# Patient Record
Sex: Male | Born: 1935 | Race: White | Hispanic: No | Smoking: Former smoker
Health system: Southern US, Community
[De-identification: ages and names within clinical notes are randomized; demographics above are authoritative.]

## PROBLEM LIST (undated history)

## (undated) DIAGNOSIS — I10 Essential (primary) hypertension: Secondary | ICD-10-CM

## (undated) DIAGNOSIS — F32A Depression, unspecified: Secondary | ICD-10-CM

## (undated) DIAGNOSIS — E039 Hypothyroidism, unspecified: Secondary | ICD-10-CM

## (undated) DIAGNOSIS — J449 Chronic obstructive pulmonary disease, unspecified: Secondary | ICD-10-CM

## (undated) DIAGNOSIS — I4891 Unspecified atrial fibrillation: Secondary | ICD-10-CM

## (undated) DIAGNOSIS — J45909 Unspecified asthma, uncomplicated: Secondary | ICD-10-CM

## (undated) DIAGNOSIS — K219 Gastro-esophageal reflux disease without esophagitis: Secondary | ICD-10-CM

## (undated) DIAGNOSIS — N400 Enlarged prostate without lower urinary tract symptoms: Secondary | ICD-10-CM

## (undated) DIAGNOSIS — I5022 Chronic systolic (congestive) heart failure: Secondary | ICD-10-CM

## (undated) DIAGNOSIS — F419 Anxiety disorder, unspecified: Secondary | ICD-10-CM

## (undated) DIAGNOSIS — I719 Aortic aneurysm of unspecified site, without rupture: Secondary | ICD-10-CM

## (undated) DIAGNOSIS — F329 Major depressive disorder, single episode, unspecified: Secondary | ICD-10-CM

## (undated) DIAGNOSIS — F0390 Unspecified dementia without behavioral disturbance: Secondary | ICD-10-CM

## (undated) HISTORY — PX: CORONARY ARTERY BYPASS GRAFT: SHX141

## (undated) HISTORY — PX: PROSTATE SURGERY: SHX751

## (undated) HISTORY — PX: ABDOMINAL AORTIC ANEURYSM REPAIR: SUR1152

---

## 2004-09-11 ENCOUNTER — Ambulatory Visit: Payer: Self-pay | Admitting: Internal Medicine

## 2004-10-20 ENCOUNTER — Ambulatory Visit: Payer: Self-pay | Admitting: Oncology

## 2004-10-30 ENCOUNTER — Ambulatory Visit: Payer: Self-pay | Admitting: Gastroenterology

## 2004-11-27 ENCOUNTER — Ambulatory Visit: Payer: Self-pay | Admitting: Gastroenterology

## 2005-01-01 ENCOUNTER — Ambulatory Visit: Payer: Self-pay | Admitting: Gastroenterology

## 2005-02-06 ENCOUNTER — Ambulatory Visit: Payer: Self-pay | Admitting: Oncology

## 2005-02-25 ENCOUNTER — Ambulatory Visit: Payer: Self-pay | Admitting: Internal Medicine

## 2006-12-03 ENCOUNTER — Emergency Department: Payer: Self-pay | Admitting: Emergency Medicine

## 2006-12-10 ENCOUNTER — Emergency Department: Payer: Self-pay | Admitting: Specialist

## 2006-12-11 ENCOUNTER — Emergency Department: Payer: Self-pay | Admitting: Emergency Medicine

## 2006-12-31 ENCOUNTER — Ambulatory Visit: Payer: Self-pay | Admitting: Urology

## 2006-12-31 ENCOUNTER — Other Ambulatory Visit: Payer: Self-pay

## 2007-01-04 ENCOUNTER — Ambulatory Visit: Payer: Self-pay | Admitting: Urology

## 2007-01-08 ENCOUNTER — Emergency Department: Payer: Self-pay | Admitting: Unknown Physician Specialty

## 2007-01-08 ENCOUNTER — Other Ambulatory Visit: Payer: Self-pay

## 2007-01-31 ENCOUNTER — Ambulatory Visit: Payer: Self-pay | Admitting: Cardiology

## 2007-04-04 ENCOUNTER — Ambulatory Visit: Payer: Self-pay | Admitting: Internal Medicine

## 2007-11-23 ENCOUNTER — Other Ambulatory Visit: Payer: Self-pay

## 2007-11-24 ENCOUNTER — Inpatient Hospital Stay: Payer: Self-pay | Admitting: Internal Medicine

## 2008-10-14 ENCOUNTER — Emergency Department: Payer: Self-pay | Admitting: Unknown Physician Specialty

## 2008-10-25 ENCOUNTER — Inpatient Hospital Stay: Payer: Self-pay | Admitting: Specialist

## 2010-12-11 ENCOUNTER — Inpatient Hospital Stay: Payer: Self-pay | Admitting: Specialist

## 2011-01-03 ENCOUNTER — Inpatient Hospital Stay: Payer: Self-pay | Admitting: Specialist

## 2011-01-18 ENCOUNTER — Inpatient Hospital Stay: Payer: Self-pay | Admitting: Internal Medicine

## 2011-01-22 ENCOUNTER — Ambulatory Visit: Payer: Self-pay | Admitting: Internal Medicine

## 2011-02-01 ENCOUNTER — Emergency Department: Payer: Self-pay | Admitting: Emergency Medicine

## 2011-02-02 ENCOUNTER — Inpatient Hospital Stay: Payer: Self-pay | Admitting: Internal Medicine

## 2011-02-09 ENCOUNTER — Emergency Department: Payer: Self-pay | Admitting: Emergency Medicine

## 2011-02-12 ENCOUNTER — Inpatient Hospital Stay: Payer: Self-pay | Admitting: Internal Medicine

## 2011-02-21 ENCOUNTER — Emergency Department: Payer: Self-pay | Admitting: Emergency Medicine

## 2011-02-24 ENCOUNTER — Ambulatory Visit: Payer: Self-pay | Admitting: Internal Medicine

## 2012-03-07 ENCOUNTER — Ambulatory Visit: Payer: Self-pay | Admitting: Internal Medicine

## 2012-06-14 ENCOUNTER — Emergency Department: Payer: Self-pay | Admitting: Emergency Medicine

## 2012-06-15 LAB — PATHOLOGY REPORT

## 2012-09-14 ENCOUNTER — Inpatient Hospital Stay: Payer: Self-pay | Admitting: Internal Medicine

## 2012-09-14 LAB — CBC
HCT: 40.9 % (ref 40.0–52.0)
HGB: 13.6 g/dL (ref 13.0–18.0)
MCH: 28.1 pg (ref 26.0–34.0)
MCV: 85 fL (ref 80–100)

## 2012-09-14 LAB — COMPREHENSIVE METABOLIC PANEL
Albumin: 3.1 g/dL — ABNORMAL LOW (ref 3.4–5.0)
Alkaline Phosphatase: 114 U/L (ref 50–136)
BUN: 15 mg/dL (ref 7–18)
Calcium, Total: 8.5 mg/dL (ref 8.5–10.1)
Chloride: 106 mmol/L (ref 98–107)
Co2: 31 mmol/L (ref 21–32)
Creatinine: 1.05 mg/dL (ref 0.60–1.30)
EGFR (Non-African Amer.): >60
Glucose: 96 mg/dL (ref 65–99)
SGPT (ALT): 23 U/L (ref 12–78)

## 2012-09-14 LAB — TROPONIN I: Troponin-I: <0.02 ng/mL

## 2012-09-14 LAB — PROTIME-INR: Prothrombin Time: 14.5 secs (ref 11.5–14.7)

## 2012-09-14 LAB — CK TOTAL AND CKMB (NOT AT ARMC): CK-MB: 1.4 ng/mL (ref 0.5–3.6)

## 2012-09-15 LAB — CBC WITH DIFFERENTIAL/PLATELET
Basophil #: 0 10*3/uL (ref 0.0–0.1)
Eosinophil %: 0.2 %
HCT: 46.6 % (ref 40.0–52.0)
HGB: 15.5 g/dL (ref 13.0–18.0)
Lymphocyte #: 0.7 10*3/uL — ABNORMAL LOW (ref 1.0–3.6)
MCHC: 33.4 g/dL (ref 32.0–36.0)
Monocyte #: 0.3 x10 3/mm (ref 0.2–1.0)
Neutrophil #: 6.4 10*3/uL (ref 1.4–6.5)
RBC: 5.47 10*6/uL (ref 4.40–5.90)

## 2012-09-15 LAB — BASIC METABOLIC PANEL
Anion Gap: 6 — ABNORMAL LOW (ref 7–16)
Chloride: 101 mmol/L (ref 98–107)
EGFR (African American): >60
Glucose: 114 mg/dL — ABNORMAL HIGH (ref 65–99)
Osmolality: 281 (ref 275–301)
Potassium: 4.1 mmol/L (ref 3.5–5.1)

## 2012-09-16 DIAGNOSIS — I509 Heart failure, unspecified: Secondary | ICD-10-CM

## 2012-09-19 LAB — CREATININE, SERUM
Creatinine: 1.32 mg/dL — ABNORMAL HIGH (ref 0.60–1.30)
EGFR (African American): >60

## 2012-10-09 ENCOUNTER — Inpatient Hospital Stay: Payer: Self-pay | Admitting: Internal Medicine

## 2012-10-09 LAB — URINALYSIS, COMPLETE
Bacteria: NONE SEEN
Glucose,UR: >=500 mg/dL (ref 0–75)
Leukocyte Esterase: NEGATIVE
Ph: 6 (ref 4.5–8.0)
Protein: 30
Squamous Epithelial: NONE SEEN
WBC UR: <1 /HPF (ref 0–5)

## 2012-10-09 LAB — CBC
HCT: 44.4 % (ref 40.0–52.0)
HGB: 14.8 g/dL (ref 13.0–18.0)
MCH: 28.3 pg (ref 26.0–34.0)
MCV: 85 fL (ref 80–100)
Platelet: 142 10*3/uL — ABNORMAL LOW (ref 150–440)
RBC: 5.21 10*6/uL (ref 4.40–5.90)
RDW: 17.1 % — ABNORMAL HIGH (ref 11.5–14.5)

## 2012-10-09 LAB — COMPREHENSIVE METABOLIC PANEL
Albumin: 3 g/dL — ABNORMAL LOW (ref 3.4–5.0)
Alkaline Phosphatase: 119 U/L (ref 50–136)
Bilirubin,Total: 0.7 mg/dL (ref 0.2–1.0)
Calcium, Total: 8.9 mg/dL (ref 8.5–10.1)
Chloride: 106 mmol/L (ref 98–107)
Co2: 33 mmol/L — ABNORMAL HIGH (ref 21–32)
Glucose: 108 mg/dL — ABNORMAL HIGH (ref 65–99)
Potassium: 4.3 mmol/L (ref 3.5–5.1)
SGOT(AST): 19 U/L (ref 15–37)
Total Protein: 6.6 g/dL (ref 6.4–8.2)

## 2012-10-09 LAB — CK-MB
CK-MB: <0.5 ng/mL — ABNORMAL LOW (ref 0.5–3.6)
CK-MB: 0.8 ng/mL (ref 0.5–3.6)

## 2012-10-09 LAB — PRO B NATRIURETIC PEPTIDE: B-Type Natriuretic Peptide: 2990 pg/mL — ABNORMAL HIGH (ref 0–125)

## 2012-10-09 LAB — CK TOTAL AND CKMB (NOT AT ARMC): CK-MB: 1 ng/mL (ref 0.5–3.6)

## 2012-10-09 LAB — TROPONIN I: Troponin-I: <0.02 ng/mL

## 2012-10-10 LAB — URINE CULTURE

## 2012-10-10 LAB — CBC WITH DIFFERENTIAL/PLATELET
Basophil #: 0 10*3/uL (ref 0.0–0.1)
Basophil %: 0.3 %
Eosinophil #: 0 10*3/uL (ref 0.0–0.7)
HCT: 41.9 % (ref 40.0–52.0)
Lymphocyte #: 0.6 10*3/uL — ABNORMAL LOW (ref 1.0–3.6)
Lymphocyte %: 6 %
MCH: 28.3 pg (ref 26.0–34.0)
MCHC: 33.8 g/dL (ref 32.0–36.0)
MCV: 84 fL (ref 80–100)
Monocyte #: 0.2 x10 3/mm (ref 0.2–1.0)
Monocyte %: 2 %
Neutrophil #: 9.8 10*3/uL — ABNORMAL HIGH (ref 1.4–6.5)
Neutrophil %: 91.7 %
RBC: 5 10*6/uL (ref 4.40–5.90)
RDW: 17 % — ABNORMAL HIGH (ref 11.5–14.5)
WBC: 10.7 10*3/uL — ABNORMAL HIGH (ref 3.8–10.6)

## 2012-10-10 LAB — BASIC METABOLIC PANEL
Anion Gap: 5 — ABNORMAL LOW (ref 7–16)
BUN: 20 mg/dL — ABNORMAL HIGH (ref 7–18)
Calcium, Total: 9 mg/dL (ref 8.5–10.1)
Chloride: 104 mmol/L (ref 98–107)
Co2: 30 mmol/L (ref 21–32)
EGFR (African American): >60
EGFR (Non-African Amer.): >60
Osmolality: 283 (ref 275–301)
Sodium: 139 mmol/L (ref 136–145)

## 2012-12-11 ENCOUNTER — Emergency Department: Payer: Self-pay | Admitting: Emergency Medicine

## 2012-12-11 LAB — COMPREHENSIVE METABOLIC PANEL
Albumin: 3.6 g/dL (ref 3.4–5.0)
Alkaline Phosphatase: 118 U/L (ref 50–136)
Anion Gap: 9 (ref 7–16)
BUN: 12 mg/dL (ref 7–18)
Bilirubin,Total: 0.5 mg/dL (ref 0.2–1.0)
Calcium, Total: 9.3 mg/dL (ref 8.5–10.1)
Chloride: 103 mmol/L (ref 98–107)
Creatinine: 1.09 mg/dL (ref 0.60–1.30)
EGFR (African American): >60
EGFR (Non-African Amer.): >60
Glucose: 118 mg/dL — ABNORMAL HIGH (ref 65–99)
Osmolality: 278 (ref 275–301)
Potassium: 3.5 mmol/L (ref 3.5–5.1)
Total Protein: 7.4 g/dL (ref 6.4–8.2)

## 2012-12-11 LAB — URINALYSIS, COMPLETE
Bacteria: NONE SEEN
Bilirubin,UR: NEGATIVE
Blood: NEGATIVE
Hyaline Cast: 2
Ph: 7 (ref 4.5–8.0)
RBC,UR: 1 /HPF (ref 0–5)
Squamous Epithelial: NONE SEEN
WBC UR: <1 /HPF (ref 0–5)

## 2012-12-11 LAB — TROPONIN I: Troponin-I: <0.02 ng/mL

## 2012-12-11 LAB — CBC
HGB: 15.4 g/dL (ref 13.0–18.0)
MCHC: 35.2 g/dL (ref 32.0–36.0)
MCV: 84 fL (ref 80–100)
RDW: 17.4 % — ABNORMAL HIGH (ref 11.5–14.5)
WBC: 10.5 10*3/uL (ref 3.8–10.6)

## 2013-02-27 ENCOUNTER — Emergency Department: Payer: Self-pay | Admitting: Emergency Medicine

## 2013-02-27 LAB — COMPREHENSIVE METABOLIC PANEL
ALBUMIN: 3.6 g/dL (ref 3.4–5.0)
ALT: 42 U/L (ref 12–78)
Alkaline Phosphatase: 98 U/L
Anion Gap: 5 — ABNORMAL LOW (ref 7–16)
BILIRUBIN TOTAL: 0.7 mg/dL (ref 0.2–1.0)
BUN: 14 mg/dL (ref 7–18)
CO2: 31 mmol/L (ref 21–32)
Calcium, Total: 9.4 mg/dL (ref 8.5–10.1)
Chloride: 104 mmol/L (ref 98–107)
Creatinine: 0.86 mg/dL (ref 0.60–1.30)
GLUCOSE: 79 mg/dL (ref 65–99)
Osmolality: 279 (ref 275–301)
Potassium: 4 mmol/L (ref 3.5–5.1)
SGOT(AST): 42 U/L — ABNORMAL HIGH (ref 15–37)
SODIUM: 140 mmol/L (ref 136–145)
TOTAL PROTEIN: 7.5 g/dL (ref 6.4–8.2)

## 2013-02-27 LAB — CBC
HCT: 44.8 % (ref 40.0–52.0)
HGB: 15.2 g/dL (ref 13.0–18.0)
MCH: 29.4 pg (ref 26.0–34.0)
MCHC: 33.9 g/dL (ref 32.0–36.0)
MCV: 87 fL (ref 80–100)
Platelet: 175 10*3/uL (ref 150–440)
RBC: 5.17 10*6/uL (ref 4.40–5.90)
RDW: 15.5 % — ABNORMAL HIGH (ref 11.5–14.5)
WBC: 9.5 10*3/uL (ref 3.8–10.6)

## 2013-06-25 ENCOUNTER — Emergency Department: Payer: Self-pay | Admitting: Internal Medicine

## 2013-06-25 LAB — URINALYSIS, COMPLETE
Bacteria: NONE SEEN
Bilirubin,UR: NEGATIVE
Blood: NEGATIVE
Glucose,UR: NEGATIVE mg/dL (ref 0–75)
KETONE: NEGATIVE
LEUKOCYTE ESTERASE: NEGATIVE
NITRITE: NEGATIVE
PROTEIN: NEGATIVE
Ph: 7 (ref 4.5–8.0)
SPECIFIC GRAVITY: 1.011 (ref 1.003–1.030)

## 2013-06-25 LAB — CBC
HCT: 46.4 % (ref 40.0–52.0)
HGB: 15.7 g/dL (ref 13.0–18.0)
MCH: 29.5 pg (ref 26.0–34.0)
MCHC: 33.8 g/dL (ref 32.0–36.0)
MCV: 87 fL (ref 80–100)
Platelet: 187 10*3/uL (ref 150–440)
RBC: 5.33 10*6/uL (ref 4.40–5.90)
RDW: 16 % — AB (ref 11.5–14.5)
WBC: 12.1 10*3/uL — ABNORMAL HIGH (ref 3.8–10.6)

## 2013-06-25 LAB — COMPREHENSIVE METABOLIC PANEL
ALK PHOS: 102 U/L
Albumin: 3.5 g/dL (ref 3.4–5.0)
Anion Gap: 7 (ref 7–16)
BUN: 14 mg/dL (ref 7–18)
Bilirubin,Total: 0.9 mg/dL (ref 0.2–1.0)
CHLORIDE: 102 mmol/L (ref 98–107)
CREATININE: 1.01 mg/dL (ref 0.60–1.30)
Calcium, Total: 9.6 mg/dL (ref 8.5–10.1)
Co2: 31 mmol/L (ref 21–32)
GLUCOSE: 103 mg/dL — AB (ref 65–99)
Osmolality: 280 (ref 275–301)
Potassium: 4 mmol/L (ref 3.5–5.1)
SGOT(AST): 45 U/L — ABNORMAL HIGH (ref 15–37)
SGPT (ALT): 41 U/L (ref 12–78)
Sodium: 140 mmol/L (ref 136–145)
Total Protein: 7.7 g/dL (ref 6.4–8.2)

## 2013-06-25 LAB — PRO B NATRIURETIC PEPTIDE: B-TYPE NATIURETIC PEPTID: 2196 pg/mL — AB (ref 0–450)

## 2013-06-25 LAB — TROPONIN I: Troponin-I: <0.02 ng/mL

## 2013-07-27 ENCOUNTER — Inpatient Hospital Stay: Payer: Self-pay | Admitting: Internal Medicine

## 2013-07-27 LAB — BASIC METABOLIC PANEL
Anion Gap: 6 — ABNORMAL LOW (ref 7–16)
BUN: 15 mg/dL (ref 7–18)
CHLORIDE: 104 mmol/L (ref 98–107)
Calcium, Total: 8.9 mg/dL (ref 8.5–10.1)
Co2: 32 mmol/L (ref 21–32)
Creatinine: 1.13 mg/dL (ref 0.60–1.30)
EGFR (Non-African Amer.): >60
GLUCOSE: 94 mg/dL (ref 65–99)
OSMOLALITY: 284 (ref 275–301)
Potassium: 3.6 mmol/L (ref 3.5–5.1)
SODIUM: 142 mmol/L (ref 136–145)

## 2013-07-27 LAB — CBC
HCT: 45.8 % (ref 40.0–52.0)
HGB: 14.9 g/dL (ref 13.0–18.0)
MCH: 28.6 pg (ref 26.0–34.0)
MCHC: 32.4 g/dL (ref 32.0–36.0)
MCV: 88 fL (ref 80–100)
PLATELETS: 210 10*3/uL (ref 150–440)
RBC: 5.2 10*6/uL (ref 4.40–5.90)
RDW: 15.8 % — ABNORMAL HIGH (ref 11.5–14.5)
WBC: 14.2 10*3/uL — AB (ref 3.8–10.6)

## 2013-07-27 LAB — CK TOTAL AND CKMB (NOT AT ARMC)
CK, TOTAL: 211 U/L
CK, Total: 72 U/L
CK, Total: 120 U/L
CK-MB: 1.1 ng/mL (ref 0.5–3.6)
CK-MB: 0.7 ng/mL (ref 0.5–3.6)
CK-MB: 1.8 ng/mL (ref 0.5–3.6)

## 2013-07-27 LAB — TROPONIN I
Troponin-I: <0.02 ng/mL
Troponin-I: <0.02 ng/mL
Troponin-I: <0.02 ng/mL

## 2013-07-27 LAB — PRO B NATRIURETIC PEPTIDE: B-Type Natriuretic Peptide: 2186 pg/mL — ABNORMAL HIGH (ref 0–450)

## 2013-07-27 LAB — DIGOXIN LEVEL: DIGOXIN: 0.56 ng/mL

## 2013-07-28 LAB — CBC WITH DIFFERENTIAL/PLATELET
BASOS PCT: 0.1 %
Basophil #: 0 10*3/uL (ref 0.0–0.1)
EOS PCT: 0 %
Eosinophil #: 0 10*3/uL (ref 0.0–0.7)
HCT: 44.3 % (ref 40.0–52.0)
HGB: 14.4 g/dL (ref 13.0–18.0)
Lymphocyte #: 0.8 10*3/uL — ABNORMAL LOW (ref 1.0–3.6)
Lymphocyte %: 6 %
MCH: 28.5 pg (ref 26.0–34.0)
MCHC: 32.4 g/dL (ref 32.0–36.0)
MCV: 88 fL (ref 80–100)
MONO ABS: 0.2 x10 3/mm (ref 0.2–1.0)
MONOS PCT: 1.3 %
NEUTROS PCT: 92.6 %
Neutrophil #: 12.1 10*3/uL — ABNORMAL HIGH (ref 1.4–6.5)
Platelet: 190 10*3/uL (ref 150–440)
RBC: 5.03 10*6/uL (ref 4.40–5.90)
RDW: 16 % — ABNORMAL HIGH (ref 11.5–14.5)
WBC: 13.1 10*3/uL — ABNORMAL HIGH (ref 3.8–10.6)

## 2013-07-28 LAB — BASIC METABOLIC PANEL
Anion Gap: 8 (ref 7–16)
BUN: 22 mg/dL — ABNORMAL HIGH (ref 7–18)
CALCIUM: 9.1 mg/dL (ref 8.5–10.1)
CREATININE: 1.15 mg/dL (ref 0.60–1.30)
Chloride: 102 mmol/L (ref 98–107)
Co2: 28 mmol/L (ref 21–32)
EGFR (Non-African Amer.): >60
Glucose: 167 mg/dL — ABNORMAL HIGH (ref 65–99)
Osmolality: 283 (ref 275–301)
Potassium: 4.2 mmol/L (ref 3.5–5.1)
SODIUM: 138 mmol/L (ref 136–145)

## 2013-07-28 LAB — LIPID PANEL
CHOLESTEROL: 174 mg/dL (ref 0–200)
HDL Cholesterol: 38 mg/dL — ABNORMAL LOW (ref 40–60)
Ldl Cholesterol, Calc: 120 mg/dL — ABNORMAL HIGH (ref 0–100)
TRIGLYCERIDES: 78 mg/dL (ref 0–200)
VLDL CHOLESTEROL, CALC: 16 mg/dL (ref 5–40)

## 2013-07-29 LAB — CBC WITH DIFFERENTIAL/PLATELET
BASOS ABS: 0 10*3/uL (ref 0.0–0.1)
BASOS PCT: 0.2 %
EOS ABS: 0 10*3/uL (ref 0.0–0.7)
Eosinophil %: 0 %
HCT: 42.4 % (ref 40.0–52.0)
HGB: 14 g/dL (ref 13.0–18.0)
Lymphocyte #: 0.8 10*3/uL — ABNORMAL LOW (ref 1.0–3.6)
Lymphocyte %: 5 %
MCH: 28.5 pg (ref 26.0–34.0)
MCHC: 32.9 g/dL (ref 32.0–36.0)
MCV: 86 fL (ref 80–100)
MONOS PCT: 2.6 %
Monocyte #: 0.4 x10 3/mm (ref 0.2–1.0)
NEUTROS PCT: 92.2 %
Neutrophil #: 14.2 10*3/uL — ABNORMAL HIGH (ref 1.4–6.5)
PLATELETS: 207 10*3/uL (ref 150–440)
RBC: 4.9 10*6/uL (ref 4.40–5.90)
RDW: 16.4 % — ABNORMAL HIGH (ref 11.5–14.5)
WBC: 15.4 10*3/uL — AB (ref 3.8–10.6)

## 2013-07-29 LAB — BASIC METABOLIC PANEL
Anion Gap: 6 — ABNORMAL LOW (ref 7–16)
BUN: 27 mg/dL — AB (ref 7–18)
CALCIUM: 8.9 mg/dL (ref 8.5–10.1)
CHLORIDE: 104 mmol/L (ref 98–107)
CO2: 28 mmol/L (ref 21–32)
Creatinine: 1.25 mg/dL (ref 0.60–1.30)
EGFR (African American): >60
EGFR (Non-African Amer.): 56 — ABNORMAL LOW
GLUCOSE: 163 mg/dL — AB (ref 65–99)
OSMOLALITY: 284 (ref 275–301)
Potassium: 4.1 mmol/L (ref 3.5–5.1)
SODIUM: 138 mmol/L (ref 136–145)

## 2013-08-25 ENCOUNTER — Inpatient Hospital Stay: Payer: Self-pay | Admitting: Internal Medicine

## 2013-08-25 LAB — COMPREHENSIVE METABOLIC PANEL
ALT: 33 U/L (ref 12–78)
ANION GAP: 9 (ref 7–16)
Albumin: 3.2 g/dL — ABNORMAL LOW (ref 3.4–5.0)
Alkaline Phosphatase: 105 U/L
BUN: 20 mg/dL — AB (ref 7–18)
Bilirubin,Total: 0.3 mg/dL (ref 0.2–1.0)
CO2: 23 mmol/L (ref 21–32)
Calcium, Total: 8.5 mg/dL (ref 8.5–10.1)
Chloride: 106 mmol/L (ref 98–107)
Creatinine: 1.54 mg/dL — ABNORMAL HIGH (ref 0.60–1.30)
EGFR (African American): 50 — ABNORMAL LOW
EGFR (Non-African Amer.): 43 — ABNORMAL LOW
Glucose: 118 mg/dL — ABNORMAL HIGH (ref 65–99)
Osmolality: 279 (ref 275–301)
Potassium: 4 mmol/L (ref 3.5–5.1)
SGOT(AST): 42 U/L — ABNORMAL HIGH (ref 15–37)
SODIUM: 138 mmol/L (ref 136–145)
Total Protein: 7.2 g/dL (ref 6.4–8.2)

## 2013-08-25 LAB — CBC
HCT: 42.5 % (ref 40.0–52.0)
HGB: 14 g/dL (ref 13.0–18.0)
MCH: 28.6 pg (ref 26.0–34.0)
MCHC: 32.9 g/dL (ref 32.0–36.0)
MCV: 87 fL (ref 80–100)
PLATELETS: 215 10*3/uL (ref 150–440)
RBC: 4.89 10*6/uL (ref 4.40–5.90)
RDW: 16.3 % — ABNORMAL HIGH (ref 11.5–14.5)
WBC: 8.5 10*3/uL (ref 3.8–10.6)

## 2013-08-25 LAB — CK-MB
CK-MB: 1.4 ng/mL (ref 0.5–3.6)
CK-MB: 1.6 ng/mL (ref 0.5–3.6)

## 2013-08-25 LAB — TROPONIN I: Troponin-I: <0.02 ng/mL

## 2013-08-25 LAB — PRO B NATRIURETIC PEPTIDE: B-TYPE NATIURETIC PEPTID: 1990 pg/mL — AB (ref 0–450)

## 2013-08-26 LAB — LIPID PANEL
Cholesterol: 176 mg/dL (ref 0–200)
HDL: 34 mg/dL — AB (ref 40–60)
LDL CHOLESTEROL, CALC: 129 mg/dL — AB (ref 0–100)
Triglycerides: 67 mg/dL (ref 0–200)
VLDL Cholesterol, Calc: 13 mg/dL (ref 5–40)

## 2013-08-26 LAB — TROPONIN I: Troponin-I: <0.02 ng/mL

## 2013-08-26 LAB — CK-MB: CK-MB: 1.3 ng/mL (ref 0.5–3.6)

## 2013-08-30 LAB — CULTURE, BLOOD (SINGLE)

## 2013-09-03 ENCOUNTER — Observation Stay: Payer: Self-pay | Admitting: Internal Medicine

## 2013-09-03 LAB — PROTIME-INR
INR: 1
Prothrombin Time: 13.5 secs (ref 11.5–14.7)

## 2013-09-03 LAB — COMPREHENSIVE METABOLIC PANEL
ALBUMIN: 3.2 g/dL — AB (ref 3.4–5.0)
ALK PHOS: 96 U/L
Anion Gap: 9 (ref 7–16)
BUN: 28 mg/dL — ABNORMAL HIGH (ref 7–18)
Bilirubin,Total: 0.4 mg/dL (ref 0.2–1.0)
CREATININE: 1.64 mg/dL — AB (ref 0.60–1.30)
Calcium, Total: 8.4 mg/dL — ABNORMAL LOW (ref 8.5–10.1)
Chloride: 100 mmol/L (ref 98–107)
Co2: 29 mmol/L (ref 21–32)
EGFR (African American): 47 — ABNORMAL LOW
EGFR (Non-African Amer.): 40 — ABNORMAL LOW
GLUCOSE: 96 mg/dL (ref 65–99)
Osmolality: 281 (ref 275–301)
Potassium: 3.4 mmol/L — ABNORMAL LOW (ref 3.5–5.1)
SGOT(AST): 45 U/L — ABNORMAL HIGH (ref 15–37)
SGPT (ALT): 60 U/L (ref 12–78)
Sodium: 138 mmol/L (ref 136–145)
Total Protein: 6.8 g/dL (ref 6.4–8.2)

## 2013-09-03 LAB — CBC
HCT: 45 % (ref 40.0–52.0)
HGB: 15 g/dL (ref 13.0–18.0)
MCH: 29.3 pg (ref 26.0–34.0)
MCHC: 33.3 g/dL (ref 32.0–36.0)
MCV: 88 fL (ref 80–100)
Platelet: 222 10*3/uL (ref 150–440)
RBC: 5.1 10*6/uL (ref 4.40–5.90)
RDW: 16.4 % — ABNORMAL HIGH (ref 11.5–14.5)
WBC: 13.8 10*3/uL — AB (ref 3.8–10.6)

## 2013-09-03 LAB — APTT: Activated PTT: 30.9 secs (ref 23.6–35.9)

## 2013-09-03 LAB — CK TOTAL AND CKMB (NOT AT ARMC)
CK, TOTAL: 28 U/L — AB
CK, Total: 33 U/L — ABNORMAL LOW
CK-MB: 0.7 ng/mL (ref 0.5–3.6)
CK-MB: 1.1 ng/mL (ref 0.5–3.6)

## 2013-09-03 LAB — URINALYSIS, COMPLETE
BLOOD: NEGATIVE
Bacteria: NONE SEEN
Bilirubin,UR: NEGATIVE
GLUCOSE, UR: NEGATIVE mg/dL (ref 0–75)
Ketone: NEGATIVE
LEUKOCYTE ESTERASE: NEGATIVE
Nitrite: NEGATIVE
Ph: 5 (ref 4.5–8.0)
Protein: NEGATIVE
RBC,UR: NONE SEEN /HPF (ref 0–5)
Specific Gravity: 1.013 (ref 1.003–1.030)
Squamous Epithelial: NONE SEEN
WBC UR: 1 /HPF (ref 0–5)

## 2013-09-03 LAB — TROPONIN I
Troponin-I: <0.02 ng/mL
Troponin-I: <0.02 ng/mL

## 2013-09-03 LAB — DIGOXIN LEVEL: DIGOXIN: 0.6 ng/mL

## 2013-09-04 LAB — CBC WITH DIFFERENTIAL/PLATELET
BASOS PCT: 0.6 %
Basophil #: 0.1 10*3/uL (ref 0.0–0.1)
EOS PCT: 4 %
Eosinophil #: 0.3 10*3/uL (ref 0.0–0.7)
HCT: 42.9 % (ref 40.0–52.0)
HGB: 14.4 g/dL (ref 13.0–18.0)
Lymphocyte #: 1.7 10*3/uL (ref 1.0–3.6)
Lymphocyte %: 20.8 %
MCH: 29.6 pg (ref 26.0–34.0)
MCHC: 33.5 g/dL (ref 32.0–36.0)
MCV: 88 fL (ref 80–100)
MONOS PCT: 7.1 %
Monocyte #: 0.6 x10 3/mm (ref 0.2–1.0)
Neutrophil #: 5.4 10*3/uL (ref 1.4–6.5)
Neutrophil %: 67.5 %
PLATELETS: 167 10*3/uL (ref 150–440)
RBC: 4.85 10*6/uL (ref 4.40–5.90)
RDW: 16.5 % — ABNORMAL HIGH (ref 11.5–14.5)
WBC: 8 10*3/uL (ref 3.8–10.6)

## 2013-09-04 LAB — KOH PREP

## 2013-09-04 LAB — BASIC METABOLIC PANEL
Anion Gap: 7 (ref 7–16)
BUN: 26 mg/dL — ABNORMAL HIGH (ref 7–18)
CALCIUM: 7.8 mg/dL — AB (ref 8.5–10.1)
Chloride: 106 mmol/L (ref 98–107)
Co2: 28 mmol/L (ref 21–32)
Creatinine: 1.33 mg/dL — ABNORMAL HIGH (ref 0.60–1.30)
EGFR (Non-African Amer.): 52 — ABNORMAL LOW
GLUCOSE: 90 mg/dL (ref 65–99)
OSMOLALITY: 286 (ref 275–301)
POTASSIUM: 4 mmol/L (ref 3.5–5.1)
Sodium: 141 mmol/L (ref 136–145)

## 2013-10-16 ENCOUNTER — Encounter: Payer: Self-pay | Admitting: Surgery

## 2013-10-24 ENCOUNTER — Encounter: Payer: Self-pay | Admitting: Surgery

## 2013-11-23 ENCOUNTER — Encounter: Payer: Self-pay | Admitting: Surgery

## 2013-12-12 ENCOUNTER — Emergency Department: Payer: Self-pay | Admitting: Emergency Medicine

## 2013-12-12 LAB — BASIC METABOLIC PANEL
ANION GAP: 9 (ref 7–16)
BUN: 17 mg/dL (ref 7–18)
CALCIUM: 8.6 mg/dL (ref 8.5–10.1)
CREATININE: 1.12 mg/dL (ref 0.60–1.30)
Chloride: 105 mmol/L (ref 98–107)
Co2: 29 mmol/L (ref 21–32)
EGFR (African American): >60
EGFR (Non-African Amer.): >60
Glucose: 108 mg/dL — ABNORMAL HIGH (ref 65–99)
Osmolality: 287 (ref 275–301)
Potassium: 3.7 mmol/L (ref 3.5–5.1)
SODIUM: 143 mmol/L (ref 136–145)

## 2013-12-12 LAB — CBC
HCT: 44.6 % (ref 40.0–52.0)
HGB: 14.3 g/dL (ref 13.0–18.0)
MCH: 28.6 pg (ref 26.0–34.0)
MCHC: 32.1 g/dL (ref 32.0–36.0)
MCV: 89 fL (ref 80–100)
PLATELETS: 189 10*3/uL (ref 150–440)
RBC: 5.01 10*6/uL (ref 4.40–5.90)
RDW: 16 % — ABNORMAL HIGH (ref 11.5–14.5)
WBC: 7.3 10*3/uL (ref 3.8–10.6)

## 2013-12-14 ENCOUNTER — Emergency Department: Payer: Self-pay | Admitting: Emergency Medicine

## 2013-12-14 LAB — TROPONIN I

## 2013-12-14 LAB — CBC
HCT: 45.8 % (ref 40.0–52.0)
HGB: 14.6 g/dL (ref 13.0–18.0)
MCH: 28.4 pg (ref 26.0–34.0)
MCHC: 31.9 g/dL — AB (ref 32.0–36.0)
MCV: 89 fL (ref 80–100)
PLATELETS: 242 10*3/uL (ref 150–440)
RBC: 5.14 10*6/uL (ref 4.40–5.90)
RDW: 15.2 % — ABNORMAL HIGH (ref 11.5–14.5)
WBC: 14.6 10*3/uL — ABNORMAL HIGH (ref 3.8–10.6)

## 2013-12-14 LAB — URINALYSIS, COMPLETE
BLOOD: NEGATIVE
Bacteria: NONE SEEN
Bilirubin,UR: NEGATIVE
Glucose,UR: NEGATIVE mg/dL (ref 0–75)
KETONE: NEGATIVE
LEUKOCYTE ESTERASE: NEGATIVE
NITRITE: NEGATIVE
PH: 5 (ref 4.5–8.0)
PROTEIN: NEGATIVE
RBC,UR: 1 /HPF (ref 0–5)
SPECIFIC GRAVITY: 1.016 (ref 1.003–1.030)
SQUAMOUS EPITHELIAL: NONE SEEN
WBC UR: 2 /HPF (ref 0–5)

## 2013-12-14 LAB — PRO B NATRIURETIC PEPTIDE: B-Type Natriuretic Peptide: 2680 pg/mL — ABNORMAL HIGH (ref 0–450)

## 2013-12-14 LAB — BASIC METABOLIC PANEL
Anion Gap: 2 — ABNORMAL LOW (ref 7–16)
BUN: 17 mg/dL (ref 7–18)
CO2: 29 mmol/L (ref 21–32)
Calcium, Total: 8.7 mg/dL (ref 8.5–10.1)
Chloride: 108 mmol/L — ABNORMAL HIGH (ref 98–107)
Creatinine: 1.2 mg/dL (ref 0.60–1.30)
EGFR (African American): >60
Glucose: 105 mg/dL — ABNORMAL HIGH (ref 65–99)
OSMOLALITY: 279 (ref 275–301)
Potassium: 4 mmol/L (ref 3.5–5.1)
Sodium: 139 mmol/L (ref 136–145)

## 2013-12-15 ENCOUNTER — Inpatient Hospital Stay: Payer: Self-pay | Admitting: Internal Medicine

## 2013-12-15 LAB — CBC WITH DIFFERENTIAL/PLATELET
BASOS PCT: 0.1 %
Basophil #: 0 10*3/uL (ref 0.0–0.1)
EOS ABS: 0 10*3/uL (ref 0.0–0.7)
Eosinophil %: 0.1 %
HCT: 40.4 % (ref 40.0–52.0)
HGB: 13.4 g/dL (ref 13.0–18.0)
LYMPHS ABS: 0.9 10*3/uL — AB (ref 1.0–3.6)
Lymphocyte %: 8.7 %
MCH: 29.4 pg (ref 26.0–34.0)
MCHC: 33.2 g/dL (ref 32.0–36.0)
MCV: 88 fL (ref 80–100)
Monocyte #: 0.4 x10 3/mm (ref 0.2–1.0)
Monocyte %: 4.2 %
Neutrophil #: 8.6 10*3/uL — ABNORMAL HIGH (ref 1.4–6.5)
Neutrophil %: 86.9 %
Platelet: 163 10*3/uL (ref 150–440)
RBC: 4.57 10*6/uL (ref 4.40–5.90)
RDW: 15.5 % — AB (ref 11.5–14.5)
WBC: 9.9 10*3/uL (ref 3.8–10.6)

## 2013-12-15 LAB — URINALYSIS, COMPLETE
BILIRUBIN, UR: NEGATIVE
BLOOD: NEGATIVE
Bacteria: NONE SEEN
GLUCOSE, UR: NEGATIVE mg/dL (ref 0–75)
Hyaline Cast: 5
KETONE: NEGATIVE
Leukocyte Esterase: NEGATIVE
Nitrite: NEGATIVE
Ph: 5 (ref 4.5–8.0)
Protein: NEGATIVE
RBC,UR: 1 /HPF (ref 0–5)
Specific Gravity: 1.019 (ref 1.003–1.030)
Squamous Epithelial: <1
WBC UR: 1 /HPF (ref 0–5)

## 2013-12-15 LAB — MAGNESIUM: MAGNESIUM: 1.5 mg/dL — AB

## 2013-12-15 LAB — COMPREHENSIVE METABOLIC PANEL
ALBUMIN: 2.9 g/dL — AB (ref 3.4–5.0)
ALT: 29 U/L
ANION GAP: 6 — AB (ref 7–16)
Alkaline Phosphatase: 80 U/L
BILIRUBIN TOTAL: 0.4 mg/dL (ref 0.2–1.0)
BUN: 33 mg/dL — ABNORMAL HIGH (ref 7–18)
Calcium, Total: 8.5 mg/dL (ref 8.5–10.1)
Chloride: 103 mmol/L (ref 98–107)
Co2: 30 mmol/L (ref 21–32)
Creatinine: 1.53 mg/dL — ABNORMAL HIGH (ref 0.60–1.30)
EGFR (African American): 57 — ABNORMAL LOW
GFR CALC NON AF AMER: 47 — AB
GLUCOSE: 131 mg/dL — AB (ref 65–99)
Osmolality: 287 (ref 275–301)
Potassium: 3.4 mmol/L — ABNORMAL LOW (ref 3.5–5.1)
SGOT(AST): 29 U/L (ref 15–37)
Sodium: 139 mmol/L (ref 136–145)
TOTAL PROTEIN: 6.4 g/dL (ref 6.4–8.2)

## 2013-12-15 LAB — DIGOXIN LEVEL: Digoxin: 0.66 ng/mL

## 2013-12-15 LAB — LIPASE, BLOOD: LIPASE: 60 U/L — AB (ref 73–393)

## 2013-12-15 LAB — TROPONIN I

## 2013-12-15 LAB — PROTIME-INR
INR: 1.1
Prothrombin Time: 14.5 secs (ref 11.5–14.7)

## 2013-12-16 LAB — CBC WITH DIFFERENTIAL/PLATELET
Basophil #: 0 10*3/uL (ref 0.0–0.1)
Basophil %: 0.1 %
EOS ABS: 0 10*3/uL (ref 0.0–0.7)
Eosinophil %: 0 %
HCT: 39.8 % — AB (ref 40.0–52.0)
HGB: 13 g/dL (ref 13.0–18.0)
Lymphocyte #: 0.4 10*3/uL — ABNORMAL LOW (ref 1.0–3.6)
Lymphocyte %: 4.1 %
MCH: 29 pg (ref 26.0–34.0)
MCHC: 32.5 g/dL (ref 32.0–36.0)
MCV: 89 fL (ref 80–100)
MONO ABS: 0.3 x10 3/mm (ref 0.2–1.0)
Monocyte %: 3.9 %
NEUTROS ABS: 8.3 10*3/uL — AB (ref 1.4–6.5)
NEUTROS PCT: 91.9 %
PLATELETS: 159 10*3/uL (ref 150–440)
RBC: 4.47 10*6/uL (ref 4.40–5.90)
RDW: 15.6 % — AB (ref 11.5–14.5)
WBC: 9 10*3/uL (ref 3.8–10.6)

## 2013-12-16 LAB — BASIC METABOLIC PANEL
Anion Gap: 7 (ref 7–16)
BUN: 29 mg/dL — ABNORMAL HIGH (ref 7–18)
CO2: 27 mmol/L (ref 21–32)
CREATININE: 1.33 mg/dL — AB (ref 0.60–1.30)
Calcium, Total: 8.6 mg/dL (ref 8.5–10.1)
Chloride: 108 mmol/L — ABNORMAL HIGH (ref 98–107)
EGFR (African American): >60
GFR CALC NON AF AMER: 56 — AB
Glucose: 215 mg/dL — ABNORMAL HIGH (ref 65–99)
Osmolality: 295 (ref 275–301)
POTASSIUM: 4.5 mmol/L (ref 3.5–5.1)
Sodium: 142 mmol/L (ref 136–145)

## 2013-12-16 LAB — MAGNESIUM: MAGNESIUM: 2.4 mg/dL

## 2013-12-18 LAB — CBC WITH DIFFERENTIAL/PLATELET
Basophil #: 0 10*3/uL (ref 0.0–0.1)
Basophil %: 0.1 %
EOS ABS: 0 10*3/uL (ref 0.0–0.7)
EOS PCT: 0.2 %
HCT: 37.7 % — ABNORMAL LOW (ref 40.0–52.0)
HGB: 12.5 g/dL — ABNORMAL LOW (ref 13.0–18.0)
Lymphocyte #: 0.6 10*3/uL — ABNORMAL LOW (ref 1.0–3.6)
Lymphocyte %: 10.2 %
MCH: 29.6 pg (ref 26.0–34.0)
MCHC: 33.3 g/dL (ref 32.0–36.0)
MCV: 89 fL (ref 80–100)
MONO ABS: 0.3 x10 3/mm (ref 0.2–1.0)
MONOS PCT: 5.6 %
Neutrophil #: 5 10*3/uL (ref 1.4–6.5)
Neutrophil %: 83.9 %
Platelet: 186 10*3/uL (ref 150–440)
RBC: 4.24 10*6/uL — AB (ref 4.40–5.90)
RDW: 15.3 % — ABNORMAL HIGH (ref 11.5–14.5)
WBC: 6 10*3/uL (ref 3.8–10.6)

## 2013-12-18 LAB — BASIC METABOLIC PANEL
ANION GAP: 5 — AB (ref 7–16)
BUN: 26 mg/dL — ABNORMAL HIGH (ref 7–18)
CALCIUM: 8.3 mg/dL — AB (ref 8.5–10.1)
CO2: 29 mmol/L (ref 21–32)
Chloride: 107 mmol/L (ref 98–107)
Creatinine: 1.06 mg/dL (ref 0.60–1.30)
EGFR (African American): >60
EGFR (Non-African Amer.): >60
Glucose: 127 mg/dL — ABNORMAL HIGH (ref 65–99)
OSMOLALITY: 288 (ref 275–301)
Potassium: 4.5 mmol/L (ref 3.5–5.1)
Sodium: 141 mmol/L (ref 136–145)

## 2013-12-19 LAB — BASIC METABOLIC PANEL
Anion Gap: 6 — ABNORMAL LOW (ref 7–16)
BUN: 27 mg/dL — AB (ref 7–18)
CHLORIDE: 104 mmol/L (ref 98–107)
CO2: 32 mmol/L (ref 21–32)
Calcium, Total: 8.6 mg/dL (ref 8.5–10.1)
Creatinine: 1.03 mg/dL (ref 0.60–1.30)
EGFR (African American): >60
EGFR (Non-African Amer.): >60
Glucose: 108 mg/dL — ABNORMAL HIGH (ref 65–99)
Osmolality: 289 (ref 275–301)
POTASSIUM: 4.5 mmol/L (ref 3.5–5.1)
Sodium: 142 mmol/L (ref 136–145)

## 2013-12-19 LAB — CBC WITH DIFFERENTIAL/PLATELET
EOS PCT: 1 %
HCT: 39.9 % — ABNORMAL LOW (ref 40.0–52.0)
HGB: 12.7 g/dL — ABNORMAL LOW (ref 13.0–18.0)
Lymphocytes: 14 %
MCH: 28.3 pg (ref 26.0–34.0)
MCHC: 31.8 g/dL — ABNORMAL LOW (ref 32.0–36.0)
MCV: 89 fL (ref 80–100)
Metamyelocyte: 1 %
Monocytes: 8 %
Platelet: 207 10*3/uL (ref 150–440)
RBC: 4.48 10*6/uL (ref 4.40–5.90)
RDW: 15.3 % — ABNORMAL HIGH (ref 11.5–14.5)
Segmented Neutrophils: 76 %
WBC: 6.2 10*3/uL (ref 3.8–10.6)

## 2013-12-20 ENCOUNTER — Observation Stay: Payer: Self-pay | Admitting: Internal Medicine

## 2013-12-20 LAB — COMPREHENSIVE METABOLIC PANEL
ALK PHOS: 81 U/L
AST: 58 U/L — AB (ref 15–37)
Albumin: 3.1 g/dL — ABNORMAL LOW (ref 3.4–5.0)
Anion Gap: 9 (ref 7–16)
BUN: 27 mg/dL — ABNORMAL HIGH (ref 7–18)
Bilirubin,Total: 0.5 mg/dL (ref 0.2–1.0)
CHLORIDE: 102 mmol/L (ref 98–107)
Calcium, Total: 8.8 mg/dL (ref 8.5–10.1)
Co2: 31 mmol/L (ref 21–32)
Creatinine: 1.29 mg/dL (ref 0.60–1.30)
EGFR (African American): >60
EGFR (Non-African Amer.): 58 — ABNORMAL LOW
GLUCOSE: 174 mg/dL — AB (ref 65–99)
Osmolality: 292 (ref 275–301)
Potassium: 3.9 mmol/L (ref 3.5–5.1)
SGPT (ALT): 55 U/L
Sodium: 142 mmol/L (ref 136–145)
TOTAL PROTEIN: 6.7 g/dL (ref 6.4–8.2)

## 2013-12-20 LAB — URINALYSIS, COMPLETE
BACTERIA: NONE SEEN
BILIRUBIN, UR: NEGATIVE
Blood: NEGATIVE
GLUCOSE, UR: NEGATIVE mg/dL (ref 0–75)
LEUKOCYTE ESTERASE: NEGATIVE
Nitrite: NEGATIVE
PH: 7 (ref 4.5–8.0)
Protein: NEGATIVE
RBC,UR: 1 /HPF (ref 0–5)
SQUAMOUS EPITHELIAL: NONE SEEN
Specific Gravity: 1.013 (ref 1.003–1.030)
WBC UR: NONE SEEN /HPF (ref 0–5)

## 2013-12-20 LAB — TROPONIN I: Troponin-I: <0.02 ng/mL

## 2013-12-20 LAB — CBC
HCT: 40.3 % (ref 40.0–52.0)
HGB: 13.2 g/dL (ref 13.0–18.0)
MCH: 28.9 pg (ref 26.0–34.0)
MCHC: 32.7 g/dL (ref 32.0–36.0)
MCV: 88 fL (ref 80–100)
Platelet: 238 10*3/uL (ref 150–440)
RBC: 4.56 10*6/uL (ref 4.40–5.90)
RDW: 15.7 % — ABNORMAL HIGH (ref 11.5–14.5)
WBC: 7.7 10*3/uL (ref 3.8–10.6)

## 2013-12-20 LAB — PRO B NATRIURETIC PEPTIDE: B-Type Natriuretic Peptide: 6271 pg/mL — ABNORMAL HIGH (ref 0–450)

## 2013-12-20 LAB — DIGOXIN LEVEL: Digoxin: 0.62 ng/mL

## 2013-12-20 LAB — CULTURE, BLOOD (SINGLE)

## 2013-12-20 LAB — MAGNESIUM: Magnesium: 1.8 mg/dL

## 2013-12-20 LAB — D-DIMER(ARMC): D-Dimer: 3590 ng/ml

## 2013-12-21 LAB — CBC WITH DIFFERENTIAL/PLATELET
BASOS ABS: 0 10*3/uL (ref 0.0–0.1)
BASOS PCT: 0.1 %
EOS ABS: 0 10*3/uL (ref 0.0–0.7)
Eosinophil %: 0.2 %
HCT: 39.6 % — ABNORMAL LOW (ref 40.0–52.0)
HGB: 13.2 g/dL (ref 13.0–18.0)
LYMPHS ABS: 0.5 10*3/uL — AB (ref 1.0–3.6)
LYMPHS PCT: 8.5 %
MCH: 29.3 pg (ref 26.0–34.0)
MCHC: 33.2 g/dL (ref 32.0–36.0)
MCV: 88 fL (ref 80–100)
MONO ABS: 0.3 x10 3/mm (ref 0.2–1.0)
Monocyte %: 4.6 %
Neutrophil #: 5.6 10*3/uL (ref 1.4–6.5)
Neutrophil %: 86.6 %
Platelet: 225 10*3/uL (ref 150–440)
RBC: 4.49 10*6/uL (ref 4.40–5.90)
RDW: 15.5 % — AB (ref 11.5–14.5)
WBC: 6.4 10*3/uL (ref 3.8–10.6)

## 2013-12-21 LAB — BASIC METABOLIC PANEL
ANION GAP: 7 (ref 7–16)
BUN: 25 mg/dL — ABNORMAL HIGH (ref 7–18)
CHLORIDE: 104 mmol/L (ref 98–107)
Calcium, Total: 8.6 mg/dL (ref 8.5–10.1)
Co2: 32 mmol/L (ref 21–32)
Creatinine: 1.05 mg/dL (ref 0.60–1.30)
EGFR (African American): >60
EGFR (Non-African Amer.): >60
GLUCOSE: 149 mg/dL — AB (ref 65–99)
Osmolality: 292 (ref 275–301)
POTASSIUM: 4.2 mmol/L (ref 3.5–5.1)
Sodium: 143 mmol/L (ref 136–145)

## 2013-12-22 LAB — CBC WITH DIFFERENTIAL/PLATELET
BASOS PCT: 0.1 %
Basophil #: 0 10*3/uL (ref 0.0–0.1)
Eosinophil #: 0 10*3/uL (ref 0.0–0.7)
Eosinophil %: 0.2 %
HCT: 40.6 % (ref 40.0–52.0)
HGB: 13.6 g/dL (ref 13.0–18.0)
LYMPHS ABS: 1 10*3/uL (ref 1.0–3.6)
Lymphocyte %: 12.6 %
MCH: 29.6 pg (ref 26.0–34.0)
MCHC: 33.5 g/dL (ref 32.0–36.0)
MCV: 89 fL (ref 80–100)
MONOS PCT: 5.9 %
Monocyte #: 0.5 x10 3/mm (ref 0.2–1.0)
NEUTROS ABS: 6.2 10*3/uL (ref 1.4–6.5)
Neutrophil %: 81.2 %
PLATELETS: 239 10*3/uL (ref 150–440)
RBC: 4.58 10*6/uL (ref 4.40–5.90)
RDW: 15.4 % — ABNORMAL HIGH (ref 11.5–14.5)
WBC: 7.7 10*3/uL (ref 3.8–10.6)

## 2013-12-22 LAB — BASIC METABOLIC PANEL
Anion Gap: 7 (ref 7–16)
BUN: 31 mg/dL — AB (ref 7–18)
CO2: 31 mmol/L (ref 21–32)
Calcium, Total: 8.2 mg/dL — ABNORMAL LOW (ref 8.5–10.1)
Chloride: 105 mmol/L (ref 98–107)
Creatinine: 1.08 mg/dL (ref 0.60–1.30)
EGFR (Non-African Amer.): >60
GLUCOSE: 101 mg/dL — AB (ref 65–99)
Osmolality: 292 (ref 275–301)
Potassium: 3.8 mmol/L (ref 3.5–5.1)
Sodium: 143 mmol/L (ref 136–145)

## 2013-12-24 ENCOUNTER — Encounter: Payer: Self-pay | Admitting: Surgery

## 2014-01-28 ENCOUNTER — Ambulatory Visit: Payer: Self-pay | Admitting: Internal Medicine

## 2014-01-28 ENCOUNTER — Observation Stay: Payer: Self-pay | Admitting: Internal Medicine

## 2014-01-28 LAB — URINALYSIS, COMPLETE
BILIRUBIN, UR: NEGATIVE
BLOOD: NEGATIVE
Bacteria: NONE SEEN
Glucose,UR: NEGATIVE mg/dL (ref 0–75)
Hyaline Cast: 3
KETONE: NEGATIVE
Leukocyte Esterase: NEGATIVE
Nitrite: NEGATIVE
PROTEIN: NEGATIVE
Ph: 5 (ref 4.5–8.0)
RBC,UR: NONE SEEN /HPF (ref 0–5)
SQUAMOUS EPITHELIAL: NONE SEEN
Specific Gravity: 1.01 (ref 1.003–1.030)

## 2014-01-28 LAB — CBC WITH DIFFERENTIAL/PLATELET
Basophil #: 0.1 10*3/uL (ref 0.0–0.1)
Basophil %: 0.5 %
Eosinophil #: 0.3 10*3/uL (ref 0.0–0.7)
Eosinophil %: 2.5 %
HCT: 39 % — AB (ref 40.0–52.0)
HGB: 12.8 g/dL — AB (ref 13.0–18.0)
LYMPHS ABS: 0.9 10*3/uL — AB (ref 1.0–3.6)
LYMPHS PCT: 7.9 %
MCH: 28.2 pg (ref 26.0–34.0)
MCHC: 32.9 g/dL (ref 32.0–36.0)
MCV: 86 fL (ref 80–100)
MONOS PCT: 6.4 %
Monocyte #: 0.8 x10 3/mm (ref 0.2–1.0)
Neutrophil #: 9.8 10*3/uL — ABNORMAL HIGH (ref 1.4–6.5)
Neutrophil %: 82.7 %
Platelet: 211 10*3/uL (ref 150–440)
RBC: 4.54 10*6/uL (ref 4.40–5.90)
RDW: 15.5 % — AB (ref 11.5–14.5)
WBC: 11.8 10*3/uL — ABNORMAL HIGH (ref 3.8–10.6)

## 2014-01-28 LAB — COMPREHENSIVE METABOLIC PANEL
ALBUMIN: 2.9 g/dL — AB (ref 3.4–5.0)
Alkaline Phosphatase: 100 U/L
Anion Gap: 8 (ref 7–16)
BUN: 12 mg/dL (ref 7–18)
Bilirubin,Total: 0.7 mg/dL (ref 0.2–1.0)
CHLORIDE: 101 mmol/L (ref 98–107)
CO2: 32 mmol/L (ref 21–32)
Calcium, Total: 9 mg/dL (ref 8.5–10.1)
Creatinine: 1.24 mg/dL (ref 0.60–1.30)
EGFR (Non-African Amer.): >60
Glucose: 106 mg/dL — ABNORMAL HIGH (ref 65–99)
Osmolality: 281 (ref 275–301)
POTASSIUM: 4 mmol/L (ref 3.5–5.1)
SGOT(AST): 32 U/L (ref 15–37)
SGPT (ALT): 25 U/L
Sodium: 141 mmol/L (ref 136–145)
Total Protein: 7 g/dL (ref 6.4–8.2)

## 2014-01-29 LAB — CBC WITH DIFFERENTIAL/PLATELET
BASOS PCT: 0.5 %
Basophil #: 0 10*3/uL (ref 0.0–0.1)
Eosinophil #: 0.5 10*3/uL (ref 0.0–0.7)
Eosinophil %: 6.8 %
HCT: 37.5 % — ABNORMAL LOW (ref 40.0–52.0)
HGB: 12.4 g/dL — ABNORMAL LOW (ref 13.0–18.0)
LYMPHS ABS: 1.3 10*3/uL (ref 1.0–3.6)
LYMPHS PCT: 16.8 %
MCH: 28.1 pg (ref 26.0–34.0)
MCHC: 33.1 g/dL (ref 32.0–36.0)
MCV: 85 fL (ref 80–100)
Monocyte #: 0.6 x10 3/mm (ref 0.2–1.0)
Monocyte %: 8.1 %
Neutrophil #: 5.2 10*3/uL (ref 1.4–6.5)
Neutrophil %: 67.8 %
Platelet: 193 10*3/uL (ref 150–440)
RBC: 4.41 10*6/uL (ref 4.40–5.90)
RDW: 15.7 % — AB (ref 11.5–14.5)
WBC: 7.6 10*3/uL (ref 3.8–10.6)

## 2014-01-29 LAB — BASIC METABOLIC PANEL
Anion Gap: 6 — ABNORMAL LOW (ref 7–16)
BUN: 14 mg/dL (ref 7–18)
CHLORIDE: 100 mmol/L (ref 98–107)
Calcium, Total: 8.4 mg/dL — ABNORMAL LOW (ref 8.5–10.1)
Co2: 33 mmol/L — ABNORMAL HIGH (ref 21–32)
Creatinine: 1.16 mg/dL (ref 0.60–1.30)
EGFR (African American): >60
EGFR (Non-African Amer.): >60
Glucose: 97 mg/dL (ref 65–99)
Osmolality: 278 (ref 275–301)
Potassium: 3.2 mmol/L — ABNORMAL LOW (ref 3.5–5.1)
SODIUM: 139 mmol/L (ref 136–145)

## 2014-01-30 LAB — URINE CULTURE

## 2014-02-02 LAB — CULTURE, BLOOD (SINGLE)

## 2014-03-18 ENCOUNTER — Emergency Department: Payer: Self-pay | Admitting: Emergency Medicine

## 2014-03-18 DIAGNOSIS — I4891 Unspecified atrial fibrillation: Secondary | ICD-10-CM | POA: Diagnosis not present

## 2014-03-18 DIAGNOSIS — R0602 Shortness of breath: Secondary | ICD-10-CM | POA: Diagnosis not present

## 2014-03-18 DIAGNOSIS — A084 Viral intestinal infection, unspecified: Secondary | ICD-10-CM | POA: Diagnosis not present

## 2014-03-18 DIAGNOSIS — Z87891 Personal history of nicotine dependence: Secondary | ICD-10-CM | POA: Diagnosis not present

## 2014-03-18 DIAGNOSIS — I10 Essential (primary) hypertension: Secondary | ICD-10-CM | POA: Diagnosis not present

## 2014-03-18 DIAGNOSIS — R509 Fever, unspecified: Secondary | ICD-10-CM | POA: Diagnosis not present

## 2014-03-18 DIAGNOSIS — J9811 Atelectasis: Secondary | ICD-10-CM | POA: Diagnosis not present

## 2014-03-18 DIAGNOSIS — J439 Emphysema, unspecified: Secondary | ICD-10-CM | POA: Diagnosis not present

## 2014-03-18 DIAGNOSIS — R05 Cough: Secondary | ICD-10-CM | POA: Diagnosis not present

## 2014-03-18 LAB — BASIC METABOLIC PANEL
ANION GAP: 10 (ref 7–16)
BUN: 13 mg/dL (ref 7–18)
CHLORIDE: 104 mmol/L (ref 98–107)
CO2: 27 mmol/L (ref 21–32)
Calcium, Total: 8.6 mg/dL (ref 8.5–10.1)
Creatinine: 1.15 mg/dL (ref 0.60–1.30)
EGFR (African American): >60
EGFR (Non-African Amer.): >60
Glucose: 100 mg/dL — ABNORMAL HIGH (ref 65–99)
OSMOLALITY: 281 (ref 275–301)
Potassium: 3.6 mmol/L (ref 3.5–5.1)
Sodium: 141 mmol/L (ref 136–145)

## 2014-03-18 LAB — TROPONIN I: Troponin-I: <0.02 ng/mL

## 2014-03-18 LAB — HEPATIC FUNCTION PANEL A (ARMC)
AST: 50 U/L — AB (ref 15–37)
Albumin: 3 g/dL — ABNORMAL LOW (ref 3.4–5.0)
Alkaline Phosphatase: 86 U/L
BILIRUBIN DIRECT: 0.1 mg/dL (ref 0.0–0.2)
Bilirubin,Total: 0.7 mg/dL (ref 0.2–1.0)
SGPT (ALT): 35 U/L
TOTAL PROTEIN: 7 g/dL (ref 6.4–8.2)

## 2014-03-18 LAB — CBC
HCT: 39.9 % — AB (ref 40.0–52.0)
HGB: 13.1 g/dL (ref 13.0–18.0)
MCH: 28.1 pg (ref 26.0–34.0)
MCHC: 32.8 g/dL (ref 32.0–36.0)
MCV: 86 fL (ref 80–100)
Platelet: 176 10*3/uL (ref 150–440)
RBC: 4.66 10*6/uL (ref 4.40–5.90)
RDW: 16.9 % — AB (ref 11.5–14.5)
WBC: 9.1 10*3/uL (ref 3.8–10.6)

## 2014-03-18 LAB — LIPASE, BLOOD: Lipase: 67 U/L — ABNORMAL LOW (ref 73–393)

## 2014-03-21 DIAGNOSIS — R0602 Shortness of breath: Secondary | ICD-10-CM | POA: Diagnosis not present

## 2014-03-23 LAB — CULTURE, BLOOD (SINGLE)

## 2014-03-24 DIAGNOSIS — J449 Chronic obstructive pulmonary disease, unspecified: Secondary | ICD-10-CM | POA: Diagnosis not present

## 2014-03-29 ENCOUNTER — Inpatient Hospital Stay: Payer: Self-pay | Admitting: Specialist

## 2014-03-29 DIAGNOSIS — J9811 Atelectasis: Secondary | ICD-10-CM | POA: Diagnosis not present

## 2014-03-29 DIAGNOSIS — Z66 Do not resuscitate: Secondary | ICD-10-CM | POA: Diagnosis not present

## 2014-03-29 DIAGNOSIS — D72829 Elevated white blood cell count, unspecified: Secondary | ICD-10-CM | POA: Diagnosis not present

## 2014-03-29 DIAGNOSIS — A419 Sepsis, unspecified organism: Secondary | ICD-10-CM | POA: Diagnosis not present

## 2014-03-29 DIAGNOSIS — N400 Enlarged prostate without lower urinary tract symptoms: Secondary | ICD-10-CM | POA: Diagnosis not present

## 2014-03-29 DIAGNOSIS — Z7982 Long term (current) use of aspirin: Secondary | ICD-10-CM | POA: Diagnosis not present

## 2014-03-29 DIAGNOSIS — J189 Pneumonia, unspecified organism: Secondary | ICD-10-CM | POA: Diagnosis not present

## 2014-03-29 DIAGNOSIS — J449 Chronic obstructive pulmonary disease, unspecified: Secondary | ICD-10-CM | POA: Diagnosis not present

## 2014-03-29 DIAGNOSIS — H409 Unspecified glaucoma: Secondary | ICD-10-CM | POA: Diagnosis not present

## 2014-03-29 DIAGNOSIS — R6510 Systemic inflammatory response syndrome (SIRS) of non-infectious origin without acute organ dysfunction: Secondary | ICD-10-CM | POA: Diagnosis not present

## 2014-03-29 DIAGNOSIS — I10 Essential (primary) hypertension: Secondary | ICD-10-CM | POA: Diagnosis not present

## 2014-03-29 DIAGNOSIS — F0391 Unspecified dementia with behavioral disturbance: Secondary | ICD-10-CM | POA: Diagnosis not present

## 2014-03-29 DIAGNOSIS — F419 Anxiety disorder, unspecified: Secondary | ICD-10-CM | POA: Diagnosis not present

## 2014-03-29 DIAGNOSIS — R0602 Shortness of breath: Secondary | ICD-10-CM | POA: Diagnosis not present

## 2014-03-29 DIAGNOSIS — Z87891 Personal history of nicotine dependence: Secondary | ICD-10-CM | POA: Diagnosis not present

## 2014-03-29 DIAGNOSIS — R0689 Other abnormalities of breathing: Secondary | ICD-10-CM | POA: Diagnosis not present

## 2014-03-29 DIAGNOSIS — Z9981 Dependence on supplemental oxygen: Secondary | ICD-10-CM | POA: Diagnosis not present

## 2014-03-29 DIAGNOSIS — R112 Nausea with vomiting, unspecified: Secondary | ICD-10-CM | POA: Diagnosis not present

## 2014-03-29 DIAGNOSIS — F329 Major depressive disorder, single episode, unspecified: Secondary | ICD-10-CM | POA: Diagnosis not present

## 2014-03-29 DIAGNOSIS — I959 Hypotension, unspecified: Secondary | ICD-10-CM | POA: Diagnosis not present

## 2014-03-29 DIAGNOSIS — E039 Hypothyroidism, unspecified: Secondary | ICD-10-CM | POA: Diagnosis not present

## 2014-03-29 DIAGNOSIS — Z8249 Family history of ischemic heart disease and other diseases of the circulatory system: Secondary | ICD-10-CM | POA: Diagnosis not present

## 2014-03-29 LAB — CBC WITH DIFFERENTIAL/PLATELET
BASOS PCT: 1.4 %
Basophil #: 0.2 10*3/uL — ABNORMAL HIGH (ref 0.0–0.1)
Eosinophil #: 0.3 10*3/uL (ref 0.0–0.7)
Eosinophil %: 1.8 %
HCT: 41.8 % (ref 40.0–52.0)
HGB: 13.7 g/dL (ref 13.0–18.0)
Lymphocyte #: 0.6 10*3/uL — ABNORMAL LOW (ref 1.0–3.6)
Lymphocyte %: 3.4 %
MCH: 27.5 pg (ref 26.0–34.0)
MCHC: 32.7 g/dL (ref 32.0–36.0)
MCV: 84 fL (ref 80–100)
MONOS PCT: 3 %
Monocyte #: 0.5 x10 3/mm (ref 0.2–1.0)
Neutrophil #: 15.7 10*3/uL — ABNORMAL HIGH (ref 1.4–6.5)
Neutrophil %: 90.4 %
Platelet: 217 10*3/uL (ref 150–440)
RBC: 4.97 10*6/uL (ref 4.40–5.90)
RDW: 17.4 % — ABNORMAL HIGH (ref 11.5–14.5)
WBC: 17.3 10*3/uL — ABNORMAL HIGH (ref 3.8–10.6)

## 2014-03-29 LAB — URINALYSIS, COMPLETE
BLOOD: NEGATIVE
Bacteria: NONE SEEN
Bilirubin,UR: NEGATIVE
Glucose,UR: NEGATIVE mg/dL (ref 0–75)
Ketone: NEGATIVE
LEUKOCYTE ESTERASE: NEGATIVE
Nitrite: NEGATIVE
Ph: 6 (ref 4.5–8.0)
Protein: 30
RBC,UR: 1 /HPF (ref 0–5)
Specific Gravity: 1.017 (ref 1.003–1.030)
Squamous Epithelial: NONE SEEN
WBC UR: 2 /HPF (ref 0–5)

## 2014-03-29 LAB — COMPREHENSIVE METABOLIC PANEL
ALBUMIN: 3.3 g/dL — AB (ref 3.4–5.0)
ANION GAP: 8 (ref 7–16)
Alkaline Phosphatase: 93 U/L (ref 46–116)
BILIRUBIN TOTAL: 0.6 mg/dL (ref 0.2–1.0)
BUN: 14 mg/dL (ref 7–18)
CHLORIDE: 102 mmol/L (ref 98–107)
Calcium, Total: 8.9 mg/dL (ref 8.5–10.1)
Co2: 30 mmol/L (ref 21–32)
Creatinine: 1.23 mg/dL (ref 0.60–1.30)
EGFR (Non-African Amer.): >60
GLUCOSE: 106 mg/dL — AB (ref 65–99)
OSMOLALITY: 280 (ref 275–301)
Potassium: 3.7 mmol/L (ref 3.5–5.1)
SGOT(AST): 26 U/L (ref 15–37)
SGPT (ALT): 30 U/L (ref 14–63)
Sodium: 140 mmol/L (ref 136–145)
TOTAL PROTEIN: 7.3 g/dL (ref 6.4–8.2)

## 2014-03-29 LAB — TROPONIN I: Troponin-I: <0.02 ng/mL

## 2014-03-29 LAB — PROTIME-INR
INR: 1
Prothrombin Time: 13.6 secs

## 2014-03-30 LAB — BASIC METABOLIC PANEL
Anion Gap: 6 — ABNORMAL LOW (ref 7–16)
BUN: 13 mg/dL (ref 7–18)
CALCIUM: 8.2 mg/dL — AB (ref 8.5–10.1)
CO2: 28 mmol/L (ref 21–32)
Chloride: 108 mmol/L — ABNORMAL HIGH (ref 98–107)
Creatinine: 1.07 mg/dL (ref 0.60–1.30)
EGFR (African American): >60
Glucose: 102 mg/dL — ABNORMAL HIGH (ref 65–99)
Osmolality: 283 (ref 275–301)
POTASSIUM: 4 mmol/L (ref 3.5–5.1)
Sodium: 142 mmol/L (ref 136–145)

## 2014-03-30 LAB — CBC WITH DIFFERENTIAL/PLATELET
Basophil #: 0 10*3/uL (ref 0.0–0.1)
Basophil %: 0.1 %
EOS PCT: 2.9 %
Eosinophil #: 0.3 10*3/uL (ref 0.0–0.7)
HCT: 36.9 % — ABNORMAL LOW (ref 40.0–52.0)
HGB: 12.1 g/dL — ABNORMAL LOW (ref 13.0–18.0)
LYMPHS ABS: 1 10*3/uL (ref 1.0–3.6)
Lymphocyte %: 9.1 %
MCH: 27.7 pg (ref 26.0–34.0)
MCHC: 32.9 g/dL (ref 32.0–36.0)
MCV: 84 fL (ref 80–100)
Monocyte #: 0.6 x10 3/mm (ref 0.2–1.0)
Monocyte %: 5.3 %
Neutrophil #: 8.9 10*3/uL — ABNORMAL HIGH (ref 1.4–6.5)
Neutrophil %: 82.6 %
PLATELETS: 151 10*3/uL (ref 150–440)
RBC: 4.39 10*6/uL — AB (ref 4.40–5.90)
RDW: 17.4 % — ABNORMAL HIGH (ref 11.5–14.5)
WBC: 10.8 10*3/uL — ABNORMAL HIGH (ref 3.8–10.6)

## 2014-03-30 LAB — URINE CULTURE

## 2014-03-30 LAB — CK: CK, TOTAL: 179 U/L (ref 39–308)

## 2014-03-30 LAB — LACTATE DEHYDROGENASE: LDH: 151 U/L (ref 85–241)

## 2014-03-31 LAB — CULTURE, BLOOD (SINGLE)

## 2014-04-10 DIAGNOSIS — E039 Hypothyroidism, unspecified: Secondary | ICD-10-CM | POA: Diagnosis not present

## 2014-04-10 DIAGNOSIS — I10 Essential (primary) hypertension: Secondary | ICD-10-CM | POA: Diagnosis not present

## 2014-04-10 DIAGNOSIS — R0602 Shortness of breath: Secondary | ICD-10-CM | POA: Diagnosis not present

## 2014-04-10 DIAGNOSIS — Z9981 Dependence on supplemental oxygen: Secondary | ICD-10-CM | POA: Diagnosis not present

## 2014-04-10 DIAGNOSIS — J449 Chronic obstructive pulmonary disease, unspecified: Secondary | ICD-10-CM | POA: Diagnosis not present

## 2014-04-16 DIAGNOSIS — R2231 Localized swelling, mass and lump, right upper limb: Secondary | ICD-10-CM | POA: Diagnosis not present

## 2014-04-16 DIAGNOSIS — J449 Chronic obstructive pulmonary disease, unspecified: Secondary | ICD-10-CM | POA: Diagnosis not present

## 2014-04-16 DIAGNOSIS — L309 Dermatitis, unspecified: Secondary | ICD-10-CM | POA: Diagnosis not present

## 2014-04-16 DIAGNOSIS — J9611 Chronic respiratory failure with hypoxia: Secondary | ICD-10-CM | POA: Diagnosis not present

## 2014-04-20 ENCOUNTER — Emergency Department: Payer: Self-pay | Admitting: Emergency Medicine

## 2014-04-20 DIAGNOSIS — R05 Cough: Secondary | ICD-10-CM | POA: Diagnosis not present

## 2014-04-20 DIAGNOSIS — J449 Chronic obstructive pulmonary disease, unspecified: Secondary | ICD-10-CM | POA: Diagnosis not present

## 2014-04-20 DIAGNOSIS — J441 Chronic obstructive pulmonary disease with (acute) exacerbation: Secondary | ICD-10-CM | POA: Diagnosis not present

## 2014-04-20 DIAGNOSIS — Z79899 Other long term (current) drug therapy: Secondary | ICD-10-CM | POA: Diagnosis not present

## 2014-04-20 DIAGNOSIS — Z87891 Personal history of nicotine dependence: Secondary | ICD-10-CM | POA: Diagnosis not present

## 2014-04-20 DIAGNOSIS — R079 Chest pain, unspecified: Secondary | ICD-10-CM | POA: Diagnosis not present

## 2014-04-20 DIAGNOSIS — R0602 Shortness of breath: Secondary | ICD-10-CM | POA: Diagnosis not present

## 2014-04-23 DIAGNOSIS — J449 Chronic obstructive pulmonary disease, unspecified: Secondary | ICD-10-CM | POA: Diagnosis not present

## 2014-05-21 ENCOUNTER — Ambulatory Visit: Payer: Self-pay | Admitting: Physician Assistant

## 2014-05-21 DIAGNOSIS — R2231 Localized swelling, mass and lump, right upper limb: Secondary | ICD-10-CM | POA: Diagnosis not present

## 2014-05-21 DIAGNOSIS — M25511 Pain in right shoulder: Secondary | ICD-10-CM | POA: Diagnosis not present

## 2014-05-23 DIAGNOSIS — J449 Chronic obstructive pulmonary disease, unspecified: Secondary | ICD-10-CM | POA: Diagnosis not present

## 2014-06-15 NOTE — Consult Note (Signed)
PATIENT NAME:  Darrell HerrlichDIX, Darrell M MR#:  [id number] DATE OF BIRTH:  Jul 20, 1935  AGE:  79 years  SEX:  Male  RACE:  White  DATE OF DICTATION:  09/17/2012  [facility]  [facility] [place]  ROOM #[id number]  DATE OF ADMISSION:  09/14/2012  CONSULTING PHYSICIAN:  Vanecia Limpert K. Akai Dollard, MD  SUBJECTIVE:  The patient was seen in consultation in room #238. According to the  the patient tried to hurt himself with a butter knife last night. Today he denies any suicidal ideation and wants to go home. According to information obtained from the patient and the sitter, the patient did not mean to hurt himself and just was looking at the butter knife, trying to shave his beard. The patient is a 79 year old white male who is retired from [employer]. The patient is married for 55 years and lives with his wife. He reports that he bought a new TV recently.   PAST PSYCHIATRIC HISTORY: Had inpatient psychiatry many years ago to hospital at [facility], [place]. No history of suicide attempts. All contracts denied.  MENTAL STATUS:  The patient is alert and oriented. Denies feeling depressed. Denies feeling hopeless or helpless. No psychosis. Denies any ideas of plans to hurt himself or others. He did not mean to hurt himself, and absolutely denies any suicidal thoughts or ideas. Insight and judgment fair.   IMPRESSION:  A life circumstance problem.  RECOMMENDATION:  Discontinue Psychologist, educationalone-on-one sitter. Discontinue suicidal precautions. I have discharged the patient, and medically cleared and stable.   ____________________________ Jannet MantisSurya K. Guss Bundehalla, MD skc:mr D: 09/17/2012 17:24:00 ET T: 09/17/2012 18:25:52 ET JOB#: [id number]  cc: Monika SalkSurya K. Guss Bundehalla, MD, <Dictator> Beau FannySURYA K Ember Gottwald MD ELECTRONICALLY SIGNED 09/18/2012 19:04

## 2014-06-15 NOTE — Discharge Summary (Signed)
PATIENT NAME:  Darrell Mcconnell, Darrell Mcconnell MR#:  [id number] DATE OF BIRTH:  1935/08/26  DATE OF ADMISSION:  10/09/2012 DATE OF DISCHARGE:  10/12/2012  ADMITTING PHYSICIAN: Enid Baas, MD   DISCHARGING PHYSICIAN:  Enid Baas, MD  PRIMARY CARE PHYSICIAN: Beverely Risen, MD  CONSULTATIONS IN THE HOSPITAL: None.   DISCHARGE DIAGNOSES 1.  Acute hypoxic respiratory failure.  2.  Chronic obstructive pulmonary disease exacerbation.  3.  Chronic diastolic congestive heart failure.  4.  Benign prostatic hypertrophy.  5.  Bipolar with depression and anxiety.  6.  Hypertension.  7.  Coronary artery disease, status post bypass graft surgery.    DISCHARGE HOME MEDICATIONS 1.  Aspirin 81 mg p.o. daily.  2.  Quetiapine 25 mg p.o. 3 times a day with meals.  3.  Quetiapine 50 mg p.o. at bedtime.  4.  Digoxin 125 mcg p.o. daily.  5.  Mirtazapine 30 mg p.o. at bedtime.  6.  Prenatal multivitamin with folic acid 1 mg, 1 tablet p.o. daily.  7.  Finasteride 5 mg p.o. daily.  8.  Omeprazole 20 mg p.o. daily.  9.  Lumigan 0.01% ophthalmic solution 1 drop to each affected eye once a day at bedtime.  10.  Colace 100 mg p.o. b.i.d.  11.  Levothyroxine 25 mcg p.o. daily.  12.  Lexapro 15 mg p.o. daily.  13.  Vitamin D3, 400 international units p.o. daily.  14.  Advair 250/50, 1 puff b.i.d.  15.  Melatonin 3 mg p.o. at bedtime.  16.  Tylenol 650 mg p.o. b.i.d. p.r.n. for pain.  17.  ProAir inhaler 1 puff 4 times a day as needed.  18.  Simethicone 80 mg q.4 hours p.r.n.  19.  MiraLax 17 p.o. daily p.r.n. for constipation.  20.  Metoprolol 12.5 mg p.o. daily.  21.  Lisinopril 2.5 mg p.o. daily.  22.  Eucerin topical cream applied to affected area twice a day.  23.  Xanax 0.25 mg p.o. q.8 hours p.r.n. for anxiety.  24.  DuoNebs with Albuterol/ipratropium 3 mL q.6 hours p.r.n. for shortness of breath.  25.  Lasix 40 mg p.o. daily.  26.  Prednisone taper over 12 days.   DISCHARGE DIET: Low-sodium diet.    DISCHARGE ACTIVITY: As tolerated.    FOLLOWUP INSTRUCTIONS:  PCP followup in 2 weeks.   LABS AND IMAGING STUDIES 1.  Prior to discharge, WBC 10.7, hemoglobin 14.2, hematocrit 41.9, platelet count 158.  2.  Sodium 139, potassium 3.7, chloride 104, bicarb 30, BUN 20, creatinine 0.97, glucose 143 and calcium of 9.0.  3.  Troponins remained negative while in the hospital.  4.  Urinalysis negative for any infection.  5.  ABG on admission showing pH of 7.48, pCO2 of 38, pO2 of 54, bicarbonate 28.3 and sats of 92% on 4 liters oxygen.  6.  BNP is elevated slightly at 2990.  7.  Chest x-ray with bilateral lung atelectasis at the bases and bypass graft surgery changes.   BRIEF HOSPITAL COURSE: [name] is a 79 year old male with past medical history significant for diastolic CHF, COPD, not on any home oxygen, hypertension, coronary artery disease, hypothyroidism, who has had multiple admissions to the hospital in the past secondary to COPD exacerbation. Presents to the ER secondary to difficulty breathing and was hypoxic significantly, requiring 3 to 4 liters of oxygen and BiPAP when he initially came in.   Acute hypoxic respiratory failure, likely secondary to COPD exacerbation. No evidence of any bronchitis or pneumonia this admission so  was not started on any antibiotics. The patient could not tolerate the BiPAP mask so he was placed on oxygen after the ABG did not reveal any CO2 retention. He was gradually weaned off to room air. He was started with IV steroids, neb treatments around the clock and also his inhalers were continued. He has improved significantly and is being discharged home. All his other home medications are being continued. He is not being discharged on any home oxygen at this time.   DISCHARGE CONDITION: Stable.   DISCHARGE DISPOSITION: To [facility].   TIME SPENT ON DISCHARGE: 45 minutes.   CODE STATUS: DO NOT RESUSCITATE. The patient has an out-of-facility  DO NOT RESUSCITATE signed and placed in the chart.   ____________________________ Enid Baasadhika Mykelle Cockerell, MD rk:cs D: 10/12/2012 15:04:00 ET T: 10/12/2012 19:59:46 ET JOB#: [id number]  cc: Lyndon CodeFozia M. Khan, MD Enid Baasadhika Lavante Toso, MD, <Dictator> Yevonne PaxSaadat A. Khan, MD   Enid BaasADHIKA Theola Cuellar MD ELECTRONICALLY SIGNED 11/02/2012 16:09

## 2014-06-15 NOTE — Discharge Summary (Signed)
PATIENT NAME:  Darrell HerrlichDIX, Jibran M MR#:  [id number] DATE OF BIRTH:  Jan 31, 1936  DATE OF ADMISSION:  09/14/2012 DATE OF DISCHARGE:    DISCHARGE DIAGNOSES:  1.  Acute respiratory failure, likely due to a combination of congestive heart failure, chronic obstructive pulmonary disease exacerbation and pneumonia, now resolved on room air and at baseline.  2.  Congestive heart failure, likely acute on chronic systolic heart failure, improving on Lasix, aspirin and metoprolol.  3.  Pneumonia, improving on antibiotics. 4.  Chronic obstructive pulmonary disease exacerbation, improving on steroids, nebulizers and antibiotics. 5.  Anxiety/depression, now stable per psychiatry recommending continuing home medications. Not suicidal at this time. IVC lifted by psychiatry.   SECONDARY DIAGNOSES:  1.  Chronic obstructive pulmonary disease.  2.  Hypertension.  3.  History of coronary artery disease. 4.  Depression. 5.  Hypothyroidism. 6.  Benign prosthetic hypertrophy. 7.  Anxiety.   CONSULTATION: Psychiatry, Dr. Guss Bundehalla.   PROCEDURE/RADIOLOGY: Chest x-ray on the 23rd of July showed possible low-grade CHF, possible right lung base infiltrate or subsegmental atelectasis. CT chest with contrast on 25th of July showed no PE. Possible underlying pulmonary fibrosis. Cardiomegaly. Trace bilateral effusion. Atelectasis versus infiltrate at the lung bases. A 2D echocardiogram on the 25th of July showed EF of 40% by visual estimation. Mild global hypokinesia with moderate anteroseptal wall hypokinesis. Possible anterior wall hypokinesis. Mildly dilated left atrium. Normal RV systolic pressure. Normal right ventricular size and systolic function.   MAJOR LABORATORY PANEL: None.   HISTORY AND SHORT HOSPITAL COURSE: The patient is a 79 year old male with above-mentioned medical problems, who was admitted for acute respiratory failure thought to be secondary to CHF and/or COPD exacerbation. Also found to have basilar pneumonia.  Please see Dr. Hilbert OdorSainani's history and physical for further details. The patient underwent a 2D echocardiogram with results dictated above showing EF of 40%. The patient was slowly improving on CHF and COPD management. He was also found to have pneumonia, which was confirmed on CT chest and was treated with antibiotics and was improving. The patient was evaluated by psychiatry, Dr. Guss Bundehalla considering concern for anxiety/depression. The patient also had a sitter due to some suicidal ideation, which was discontinued by psychiatry and the patient was cleared by Dr. Guss Bundehalla as he was found to be close to his baseline and not suicidal. He is being discharged back to his group home in stable condition on the date of discharge. His vital signs are as follows: Temperature 98.6, heart rate 74 per minute, respirations 18 per minute, blood pressure 135/80 mmHg, saturating 92% on room air.    PERTINENT PHYSICAL EXAMINATION ON THE DATE OF DISCHARGE:  CARDIOVASCULAR: Irregularly irregular heart sounds. No murmurs, rubs or gallops. The patient is in A. fib.  LUNGS: Clear to auscultation bilaterally. No wheezing, rales, rhonchi or crepitation.  ABDOMEN: Soft, benign.  NEUROLOGIC: Nonfocal examination.  All other physical examination remained at baseline.   DISCHARGE MEDICATIONS:  1.  Aspirin 81 mg p.o. daily.  2.  Lasix 20 mg p.o. daily.  3.  Digoxin 0.125 mg p.o. daily.  4.  Quetiapine 25 mg p.o. 3 times a day and 2 tablets at bedtime.  5.  Mirtazapine 30 mg p.o. daily. 6.  Prenatal vitamin once daily.  7.  Finasteride 5 mg p.o. daily. 8.  Omeprazole 20 mg p.o. daily. 9.  Lumigan 0.01% ophthalmic solution to each affected eye once at bedtime.  10. Colace 100 mg p.o. b.i.d.  11. Levothyroxine 25 mcg p.o. daily.  12. Escitalopram 10 mg 1-1/2 tablet p.o. daily.  13. Vitamin D3, 400 International Units once daily  14. Advair 250/50 1 puff b.i.d. 15. Alprazolam 0.25 mg p.o. every 8 hours.  16. Melatonin 3 mg p.o.  at bedtime. 17. Albuterol nebulizer every 6 hours as needed. 18. Ipratropium mixed with albuterol nebulizers every 6 hours as needed.  19. Tylenol 650 mg p.o. b.i.d.  20. Pro-Air inhaler 4 times a day as needed.  21. Simethicone 80 mg p.o. every 4 hours as needed.  22. MiraLax once daily as needed.  23. Metoprolol 12.5 mg p.o. daily.  24. Levaquin 750 mg p.o. daily for 2 more days.  25. Prednisone 50 mg p.o. daily, taper 10 mg daily until finished.  26. Lisinopril 2.5 mg p.o. daily.   DISCHARGE DIET: Low sodium.   DISCHARGE ACTIVITY: As tolerated.   DISCHARGE INSTRUCTIONS AND FOLLOWUP: The patient was instructed to follow up with his primary care physician at [facility] in 1 to 2 weeks. He will need followup with outpatient psychiatry in 2 to 4 weeks. He was also recommended to follow up with his cardiologist as an outpatient if needed.   TOTAL TIME DISCHARGING THIS PATIENT: 55 minutes. ____________________________ Ellamae Sia. Sherryll Burger, MD vss:aw D: 09/19/2012 08:28:00 ET T: 09/19/2012 09:04:17 ET JOB#: [id number]  cc: Lilianah Buffin S. Sherryll Burger, MD, <Dictator> Lyndon Code, MD Jannet Mantis. Guss Bunde, MD CARDIOLOGIST Patricia Pesa MD ELECTRONICALLY SIGNED 09/21/2012 9:44

## 2014-06-15 NOTE — H&P (Signed)
PATIENT NAME:  Darrell Mcconnell, MACDOUGAL MR#:  [id number] DATE OF BIRTH:  02-02-1936  DATE OF ADMISSION:  10/09/2012  ADMITTING PHYSICIAN:  Dr. Enid Baas  PRIMARY CARE PHYSICIAN:  Dr. Beverely Risen  PRIMARY PULMONOLOGIST:  Dr. Freda Munro   CHIEF COMPLAINT:  Difficulty breathing.   HISTORY OF PRESENT ILLNESS:  Mr. Sternberg is a 79 year old Caucasian male with past medical history significant for COPD, not on any home oxygen, congestive heart failure with both systolic and diastolic dysfunction, EF of 40%, depression/anxiety, early dementia, benign prostatic hypertrophy, who was recently in the hospital for CHF and COPD exacerbation, discharged 3 weeks ago, comes back with difficulty breathing and chills that started last night. The patient was significantly hypoxic when he initially came in, and had to be placed on a BiPAP mask. However, he could not tolerate the BiPAP, and his sats are improving on 2 liters oxygen at this time. The patient had significant wheezing and did not respond to the nebulization treatments and Solu-Medrol given in the ED, and is being admitted for possible COPD exacerbation.   PAST MEDICAL HISTORY: 1.  COPD, not on home oxygen.  2.  Hypertension.  3.  History of congestive heart failure, combined with both systolic and diastolic dysfunction, EF of 40%.  4.  Coronary artery disease.  5.  Gastroesophageal reflux disease.  6.  Hypothyroidism.  7.  Glaucoma.  8.  Benign prostatic hypertrophy.  9.  Anxiety and depression.   PAST SURGICAL HISTORY: 1.  Coronary artery bypass graft surgery.  2.   AAA repair.   ALLERGIES TO MEDICATIONS:  No known drug allergies.   CURRENT HOME MEDICATIONS:   1.  Advair 250/50 1 puff b.i.d.  2.  Albuterol nebulizer 3 mL q. 6 hours p.r.n.  3.  Xanax 0.25 mg p.o. q. 8 hours.  4.  Aspirin 81 mg p.o. daily.  5.  Digoxin 125 mcg p.o. daily.  6.  Colace 100 mg p.o. b.i.d.  7.  Lexapro 15 mg p.o. daily.  8.  Eucerin topical cream twice a day.  9.   Finasteride 5 mg p.o. daily.  10.  Ipratropium with albuterol nebulizers q. 6 hours.  11.  Lasix 40 mg p.o. daily from Monday to Thursday.  12.  Levothyroxine 25 mcg p.o. daily.  13.  Lisinopril 2.5 mg p.o. daily.  14.  Lumigan 0.01% ophthalmic solution 1 drop each eye at bedtime.  15.  Melatonin 3 mg p.o. at bedtime.  16.  Metoprolol 12.5 mg p.o. daily.  17.  MiraLAX powder daily p.r.n. for constipation.  18.  Mirtazapine 30 mg p.o. daily.  19.  Omeprazole 20 mg p.o. daily.  20.  Multivitamin with folic acid 1 mg p.o. daily.  21.  ProAir inhaler 4 times a day as needed.  22.  Quetiapine 25 mg 3 times a day with meals.  23.  Quetiapine 50 mg at bedtime.  24. simethicone 80 mg q. 4 hours p.r.n. for gas.  25.  Tylenol 650 mg b.i.d.  26.  Vitamin D3, 400 international units p.o. daily.   SOCIAL HISTORY:  The patient is currently at [facility] home. His wife is back at his home. Quit smoking more than 9 years ago. No alcohol abuse or drug abuse.   FAMILY HISTORY:  According to him, both parents had heart disease and died from old age.   REVIEW OF SYSTEMS:   CONSTITUTIONAL:  No fatigue, fever, weakness.  EYES: Positive for blurred vision secondary to glaucoma.  No double vision. No cataracts.  EARS, NOSE, THROAT:  No tinnitus, ear pain, hearing loss, epistaxis or discharge.  RESPIRATORY:  Positive for cough, wheeze. No hemoptysis. Positive for dyspnea on exertion and COPD. CARDIOVASCULAR:  No chest pain. Positive for orthopnea. No PND. Positive for dyspnea on exertion. No palpitations or syncope.  GASTROINTESTINAL: No nausea, vomiting, diarrhea, abdominal pain, hematemesis or melena.  GENITOURINARY:  No dysuria or hematuria.  ENDOCRINE:  No polyuria, nocturia.  HEMATOLOGY:  No anemia, easy bruising or bleeding.  SKIN:  No acne, rash or lesions.  MUSCULOSKELETAL: No neck, back arthritis, shoulder pain or gout.  NEUROLOGIC:  No numbness, weakness, CVA, TIA, or seizures.   PSYCHOLOGICAL:  History of anxiety and depression, currently none.  PHYSICAL EXAMINATION: VITAL SIGNS: Temperature is 98.3 degrees Fahrenheit, pulse 114, respirations 26, blood pressure 162/94, pulse ox 92% on 2 liters oxygen.  GENERAL:  Well-built, well nourished male lying in bed, not in any acute distress.  HEENT:  Normocephalic, atraumatic. Pupils equal, round, reacting to light. Anicteric sclerae. Extraocular movements intact. Oropharynx clear, without erythema, mass or exudates.  NECK:  Supple. No thyromegaly, JVD or carotid bruits. No lymphadenopathy.  LUNGS:  Moving air bilaterally. Significant expiratory wheezes present. No crackles. Use of accessory muscles  even on minimal exertion present.  CARDIOVASCULAR:  S1, S2. Regular rate and rhythm. No murmurs, rubs or gallops.  ABDOMEN:  Soft, nontender, nondistended. No hepatosplenomegaly. Normal bowel sounds.  EXTREMITIES:  No pedal edema. No clubbing or cyanosis. There are 2+ dorsalis pedis pulses palpable bilaterally.  SKIN:  No acne, rash or lesions, but poor turgor.  LYMPHATICS: No cervical lymphadenopathy.  NEUROLOGIC: Cranial nerves intact. No focal motor or sensory deficits.  PSYCHOLOGICAL: The patient is awake, alert, oriented x 3.   LAB DATA:  Urinalysis negative for any infection. ABG showing pH of 7.48, pCO2 of 38, pO2 of 54, bicarb of 28.3, and sats of 93% on 4 liters oxygen.   WBC 8.8, hemoglobin 14.8, hematocrit 44.4, platelet count 142. Sodium 143, potassium 4.3, chloride 106, bicarb 33, BUN 12, creatinine 1.08, glucose 108, and calcium of 8.9. ALT 19, AST 19, alkaline phosphatase 119, total bilirubin 0.7, albumin of 3.3. BNP is elevated at 2990.   Chest x-ray showing CABG changes, with bilateral atelectasis at the bases. EKG showing left axis deviation, right bundle branch block, which is chronic, atrial fibrillation, heart rate of 104.   ASSESSMENT AND PLAN:  A 79 year old male with known history of chronic obstructive  pulmonary disease, not on home oxygen, combined diastolic and systolic congestive heart failure, coronary artery disease status post bypass graft surgery, who is a resident at an independent living facility. Recent discharge about 3 weeks ago from the hospital. Comes back with acute hypoxic respiratory failure secondary to COPD exacerbation.   1. Acute hypoxic respiratory failure secondary to COPD exacerbation. Will admit, start IV Solu-Medrol, DuoNeb, continue his home inhalers, and monitor. Does not have any fever or white count or productive cough, so not treating for any bronchitis. No need for antibiotics at this time.   2.  Congestive heart failure, diastolic and systolic dysfunction. Seems to be well compensated at this time. Chest x-ray does not show any fluid overload. Continue home dose of p.o. Lasix at this time, and continue his other cardiac medications.   3.  Bipolar, with depression and anxiety. Follows with Dr. Janeece RiggersSu as an outpatient. Continue his home medications.   4.  Benign prostatic hypertrophy, on finasteride.  5.  GI  and DVT prophylaxis   6.  Code status of the patient is a DO NOT RESUSCITATE. This has been verified with the patient at the time of admission by Dr. Nemiah Commander. The patient also has an out-of-facility DNR form signed in the chart.   Time spent on admission is 50 minutes.     ____________________________ Enid Baas, MD rk:mr D: 10/09/2012 15:50:09 ET T: 10/09/2012 19:19:17 ET JOB#: [id number]  cc: Enid Baas, MD, <Dictator> Lyndon Code, MD Yevonne Pax, MD   Enid Baas MD ELECTRONICALLY SIGNED 11/02/2012 16:08

## 2014-06-15 NOTE — H&P (Signed)
PATIENT NAME:  Darrell Mcconnell, Darrell Mcconnell MR#:  [id number] DATE OF BIRTH:  10-Mar-1935  DATE OF ADMISSION:  09/14/2012  PRIMARY CARE PHYSICIAN:  Dr. Freda Munro.   CHIEF COMPLAINT:  Shortness of breath.   HISTORY OF PRESENT ILLNESS:  This is a 79 year old male who presented to the hospital complaining of shortness of breath, progressively getting worse over the past couple days. The patient says the shortness of breath is worse just with minimal exertion. He also noticed that he has been more short of breath when lying to sleep. He normally sleeps with 1 pillow, but has to be propped up for the past couple of days and has been sleeping in a recliner. He has not noticed any weight gain or any lower extremity edema. He denies any cough or any productive sputum and he denies any fevers, but admits to some chills. No nausea, no vomiting, but poor p.o. intake. No abdominal pain, no diarrhea. No other associated symptoms. The patient came to the ER, as his symptoms are progressively getting worse. He was noted to be mildly hypoxic, but room air O2 sats on triage to be 90%. The patient's chest x-ray was consistent with low-grade CHF. Hospitalist services were contacted for further treatment and evaluation.   REVIEW OF SYSTEMS:  CONSTITUTIONAL:  No documented fever. No weight gain, no weight loss.  EYES:  No blurry or double vision.  ENT:  No tinnitus, no postnasal drip, no redness of the oropharynx.  RESPIRATORY:  Positive cough. No wheeze. No hemoptysis. Positive dyspnea on exertion. Positive COPD.  CARDIOVASCULAR:  No chest pain. Positive orthopnea. Positive paroxysmal nocturnal dyspnea. Positive dyspnea on exertion, no palpitations, no syncope.  GASTROINTESTINAL:  No nausea, no vomiting, no diarrhea, no abdominal pain, no melena or hematochezia.  GENITOURINARY:  No dysuria or hematuria.  ENDOCRINE:  No polyuria or nocturia. No heat or cold intolerance.  HEMATOLOGIC:  No anemia, no bruising, no bleeding.  INTEGUMENT:   No rashes. No lesions.  MUSCULOSKELETAL:  No arthritis, no swelling, no gout.  NEUROLOGIC:  No numbness or tingling. No ataxia. No seizure-type activity.  PSYCHIATRIC:  Positive anxiety, positive depression. No ADD.   PAST MEDICAL HISTORY CONSISTENT WITH: ' 1.  Chronic obstructive pulmonary disease.  2.  Hypertension.  3.  History of congestive heart failure.  4.  History of coronary artery disease.  5.  Gastroesophageal reflux disease.  6.  Hypothyroidism.  7.  Benign prostatic hypertrophy.  8.  Anxiety.  9.  Depression.   ALLERGIES:  No known drug allergies.  SOCIAL HISTORY:  He used to be a smoker. Does have a 40 pack-year smoking history, quit about 8 years ago. No alcohol abuse. No illicit drug abuse. Resides at a retirement home independently.   FAMILY HISTORY:  Both mother and father died from complications of old age.   CURRENT MEDICATIONS ARE AS FOLLOWS:  1.  Advair 250/50, one puff b.i.d. 2.  Albuterol inhaler q.6 hours as needed.  3.  Xanax 0.25 mg q.8 hours as needed.  4.  Aspirin 81 mg daily.  5.  Digoxin 125 mcg daily.  6.  Colace 100 mg b.i.d. 7.  Lexapro 15 mg daily.  8.  Finasteride 5 mg daily.  9.  Lasix 20 mg daily.  10.  Synthroid 25 mcg daily.  11.  Lumigan 0.1% ophthalmic drop to each eye at bedtime.  12.  Melatonin 3 mg at bedtime.  13.  MiraLax daily as needed.  14.  Remeron 30 mg daily.  15.  Omeprazole 20 mg daily.  16.  Multivitamin daily.  17.  Albuterol inhaler 1 puff 4 times daily as needed.  18.  Seroquel 25 mg 3 tabs during the day and 2 tabs at bedtime.  19.  Simethicone 80 mg q.4 hours as needed.  20.  Tylenol 650 b.i.d. as needed.  21.  Vitamin D3 400 International Units daily.   PHYSICAL EXAMINATION ON ADMISSION IS AS FOLLOWS: VITAL SIGNS ARE NOTED TO BE:  Temperature is 97.9, pulse 90, respirations 18, blood pressure 95/63, sats 97% on room air.  GENERALLY:  The patient is a pleasant-appearing male, mild respiratory distress.  HEAD,  EYES, EARS, NOSE AND THROAT:  The patient is atraumatic, normocephalic. Extraocular muscles are intact. Pupils are equal and reactive to light. Sclera is anicteric. No conjunctival injection. No pharyngeal erythema.  NECK:  Supple. There is no jugular venous distention, no bruits, no lymphadenopathy, no thyromegaly.  HEART:  Regular rate and rhythm. No murmurs, no rubs, no clicks.  LUNGS:  He has some coarse bibasilar crackles, prolonged inspiratory and expiratory phase. No wheezes, no rhonchi. Negative use of accessory muscles. No dullness to percussion.  ABDOMEN:  Soft, flat, nontender, nondistended. He has good bowel sounds. No hepatosplenomegaly appreciated.  EXTREMITIES:  No evidence of any cyanosis, clubbing, or peripheral edema. Has +2 pedal and radial pulses bilaterally.  NEUROLOGICALLY:  The patient is alert, awake and oriented x 3 with no focal motor or sensory deficits appreciated bilaterally.  SKIN:  Moist and warm with no rashes appreciated.  BREASTS:  Has no cervical or axillary lymphadenopathy.   LABORATORY EXAMINATION SHOWED A:  Serum glucose of 96, BUN 15, creatinine 1.05. Sodium 140, potassium 3.6, chloride 106, bicarb 31. BNP is 3900. Troponin less than 0.02.  LFTs are within normal limits. Digoxin level is normal. White cell count 9.6, hemoglobin 13.6, hematocrit 40.9, platelet count of 131. INR is 1.1.   The patient did have a chest x-ray done which shows low-grade congestive heart failure, an increased density in the right lung base medially which may represent subsegmental atelectasis.   ASSESSMENT AND PLAN:  This is a 79 year old male with a history of coronary artery disease, hypertension, history of chronic obstructive pulmonary disease, gastroesophageal reflux disease, hypothyroidism, benign prostatic hypertrophy, anxiety/depression. Presents to the hospital with shortness of breath.  1.  Acute respiratory failure. The exact etiology of this is unclear, but suspect it to be  secondary to congestive heart failure given his clinical symptoms. He does have underlying chronic obstructive pulmonary disease, but he is not bronchospastic. Therefore, unlikely this is chronic obstructive pulmonary disease exacerbation. I will go ahead and diurese the patient with intravenous Lasix, follow inputs and outputs and daily weights and follow him clinically. I will also continue his Advair and p.r.n. DuoNebs for his chronic obstructive pulmonary disease. If  patient is clinically not improving in the next 24 hours, would consider starting him on some intravenous steroids.  2.  Congestive heart failure, likely cause of the patient's shortness of breath. This is likely acute on chronic diastolic dysfunction. The patient had a cardiac catheterization done in 2012, which showed a normal ejection fraction. I will diurese the patient with intravenous Lasix, follow inputs and outputs and daily weights. We will get a 2-dimensional echocardiogram. The patient is currently not on any beta blockers or ACE inhibitors, but will wait for the echocardiogram results before reinitiating any of these medications at this time. 3.  Gastroesophageal reflux disease. Continue omeprazole.  4.  Hypothyroidism. Continue Synthroid.  5.  Benign prostatic hypertrophy. Continue finasteride.  6.  Anxiety/depression. Continue with the Xanax, Lexapro and Remeron and Seroquel.   7.  Glaucoma. Continue with the Lumigan eyedrops.   CODE STATUS:  The patient is a full code.   TIME SPENT WITH ADMISSION:  Fifty minutes.    ____________________________ Rolly PancakeVivek J. Cherlynn KaiserSainani, MD vjs:NTS D: 09/14/2012 17:15:34 ET T: 09/14/2012 17:31:34 ET JOB#: [id number]  cc: Rolly PancakeVivek J. Cherlynn KaiserSainani, MD, <Dictator> Houston SirenVIVEK J Lorik Guo MD ELECTRONICALLY SIGNED 09/24/2012 15:18

## 2014-06-16 NOTE — Discharge Summary (Signed)
PATIENT NAME:  Darrell Mcconnell, Darrell Mcconnell MR#:  [id number] DATE OF BIRTH:  [birth year]  DATE OF ADMISSION:  09/03/2013 DATE OF DISCHARGE:  09/04/2013  PRIMARY CARE PHYSICIAN:  Dr. Beverely Risen  FINAL DIAGNOSES:  1. Chest pain with odynophagia, found to have Candida esophagitis.  2. Chronic obstructive pulmonary disease.  3. Chronic kidney disease.  4. History of coronary artery disease.  5. Gastroesophageal reflux disease.  6. Hypothyroidism.  7. Depression.  8. Hypertension.   MEDICATIONS ON DISCHARGE: Include digoxin 125 mcg daily, Seroquel 25 mg 3 times a day, 3+ prenatal multivitamin with folic acid 1 mg daily, finasteride 5 mg daily, omeprazole 20 mg daily, Colace 100 mg daily, levothyroxine 25 mcg daily, Lexapro 10 mg 1.5 tablets daily, vitamin D3 at 400 international units daily, melatonin 3 mg once a day at bedtime, ProAir HFA 1 puff 4 times a day as needed for shortness of breath and wheezing, simethicone 80 mg 4 times a day as needed for gas, Eucerin topical cream apply to arms and legs twice a day for dry skin, lisinopril 2.5 mg daily, Remeron 30 mg at bedtime, Lumigan 0.01% ophthalmic solution 1 drop each eye at bedtime, Advair Diskus 250/50 one inhalation twice a day, Seroquel 50 mg at bedtime, albuterol nebulizer 1 vial mixed with ipratropium via nebulizer every 6 hours as needed for shortness of breath, Lasix 40 mg daily, metoprolol tartrate 25 mg 1/2 tablet daily, aspirin 81 mg daily, triamcinolone cream 0.1% topical cream apply to affected area twice a day as needed for rash, MiraLax 17 grams 1 capsule once a day as needed for constipation, Maalox 30 mL twice a day as needed for indigestion, acetaminophen extended-release 650 mg twice a day, Xanax 0.25 mg as needed for anxiety, Diflucan 50 mg 1 tablet once a day for 20 days.   DIET: Low sodium diet, regular consistency.   ACTIVITY: As tolerated.   FOLLOWUP:   With Dr. Bluford Kaufmann, 3 weeks; 1-2 weeks, Dr. Beverely Risen.   HOSPITAL COURSE: The patient was  admitted 09/03/2013, discharged 09/04/2013. Came in with chest pain. Admitted with possibility of unstable angina. Cardiology consultation was ordered. Dr. Juliann Pares put in as a provider.   LABORATORY AND RADIOLOGICAL DATA DURING THE HOSPITAL COURSE: Included an EKG showed atrial fibrillation, left axis deviation. Urinalysis negative. Digoxin level 0.6, glucose 96, BUN 28, creatinine 1.64, sodium 138, potassium 3.4, chloride 100, CO2 of 29, calcium 8.4. Liver function tests: AST slightly elevated at 45. White blood cell count 13.8, H and H 15.0 and 45.0, platelet count of 222,000.  PT, INR, and PTT normal range.  Three troponins are negative. Chest X-Ray: Minimal chronic left basilar atelectasis. Creatinine upon discharge 1.33. White blood cell count upon discharge 8.0, hemoglobin 14, platelet count 167,000.   Pulse oximetry on room air 96%. Echocardiogram showed an EF of 35% to 40%, severely enlarged right ventricle, moderately dilated left atrium, severely dilated right atrium, moderate mitral valve regurgitation, moderate to severe tricuspid regurgitation, KOH prep showed budding yeast with hyphae seen.   HOSPITAL COURSE PER PROBLEM LIST:  1. For the patient's chest pain, when I saw him, he was described more as painful swallowing. Cardiac enzymes were negative so this is unlikely cardiac in nature. I spoke with Dr. Bluford Kaufmann, gastroenterology, who did an upper endoscopy in the afternoon on July 13th which showed esophageal candidiasis. I started the patient on Diflucan 200 mg once and 50 mg daily. I did the lower dose of the Diflucan because it does interact with  the Lexapro and the Xanax course will be given for a 3-week total course. Follow up with Dr. Bluford Kaufmannh as outpatient. This is not cardiac chest pain.  2. COPD.  Respiratory status stable.  Pulse oximetry 96% on room air.  3. Chronic kidney disease. The patient did improve with some IV fluid hydration. Can go back to the Lasix as long as he is eating and  drinking properly as outpatient.  4. History of coronary artery disease. Cardiac enzymes were negative. Continue usual cardiac medications, aspirin, and Toprol.  5. Atrial fibrillation, rate controlled with digoxin and metoprolol. Anticoagulation with aspirin.  6. Hypothyroidism. On levothyroxine.  7. Gastroesophageal reflux disease. On omeprazole.  8. Depression. On psychiatric medication. No changes were made.   TIME SPENT ON DISCHARGE: 35 minutes.      ____________________________ Herschell Dimesichard J. Renae GlossWieting, MD rjw:dd D: 09/04/2013 17:08:36 ET T: 09/05/2013 03:13:04 ET JOB#: [id number]  cc: Herschell Dimesichard J. Renae GlossWieting, MD, <Dictator> Salley ScarletICHARD J Nigel Ericsson MD ELECTRONICALLY SIGNED 09/07/2013 12:39

## 2014-06-16 NOTE — H&P (Signed)
PATIENT NAME:  Darrell Mcconnell, Darrell Mcconnell MR#:  [id number] DATE OF BIRTH:  04-Apr-1935  DATE OF ADMISSION:  08/25/2013  REFERRING PHYSICIAN:  Dr. Margarita Grizzle.   PRIMARY CARE PHYSICIAN:  Beverely Risen.   CARDIOLOGIST:  At [facility].  CHIEF COMPLAINT: Chest pain, shortness of breath.   A 79 year old Caucasian gentleman with history of COPD, non O2 dependent, as well as congestive heart failure, EF of 40%, presenting with chest pain. Describes chest pain of acute onset in retrosternal location, intermittent, though gradually worsening pressure and quality, 8 to 10 in intensity, nonradiating with no worsening or relieving factors. On EMS arrival, noted to be hypotensive with systolic blood pressure in the 80s. Repeated in the Emergency Department, down to the 50s; however, after only 100 mL of normal saline, blood pressure normalized to the 100s.  Also of note, he has been describing shortness of breath, 1 to 2 day in duration, cough, nonproductive. No fevers, chills or further symptomatology.   REVIEW OF SYSTEMS:  CONSTITUTIONAL:  Denies fevers, chills, fatigue.  EYES: Denied blurred vision, double vision or eye pain.  EARS, NOSE, THROAT: Denies tinnitus, ear pain or hearing loss.  RESPIRATORY: Positive for cough as described above as well as shortness of breath.  CARDIOVASCULAR: Positive for chest pain as described above. Denies any palpitations or edema.  GASTROINTESTINAL: Denies nausea, vomiting, diarrhea or abdominal pain.  GENITOURINARY: Denies dysuria or hematuria.  ENDOCRINE: Denies nocturia or thyroid problems.  HEMATOLOGIC AND LYMPHATIC:  Denies easy bruising or bleeding.  SKIN: Denies rashes or lesions.  MUSCULOSKELETAL: Denies pain in neck, back, shoulder, knees, hips or arthritic symptoms.  NEUROLOGIC: Denies paralysis or paresthesias.  PSYCHIATRIC: Denies anxiety or depressive symptoms. Otherwise, full review of systems performed by me is negative.   PAST MEDICAL HISTORY:  COPD, hypertension, congestive  heart failure, EF of 40%, coronary artery disease, gastroesophageal reflux disease, hypothyroidism, anxiety, depression, BPH.   SOCIAL HISTORY: Remote tobacco usage. Denies alcohol or drug usage.   FAMILY HISTORY: Positive for coronary artery disease.   ALLERGIES: No known drug allergies.   HOME MEDICATIONS: Finasteride 5 mg p.o. daily, aspirin 81 mg p.o. daily, Tylenol 650 mg p.o. b.i.d. as needed for pain and fever, lisinopril 2.5 mg p.o. daily, Maalox 2 times daily as needed for indigestion, digoxin  0.125 mg p.o. daily, citalopram 10 mg 1-1/2 tablets p.o. daily, mirtazapine 30 mg p.o. at bedtime, quetiapine 25 mg p.o. 3 times daily, 50 mg at bedtime; alprazolam 0.25 mg p.o. q.8 hours as needed for anxiety, metoprolol 25 mg 1/2 tablet p.o. daily, Advair 250/50 mcg inhalation 1 puff b.i.d., albuterol nebulizer treatments, DuoNeb treatments every 6 hours as needed for shortness of breath, ProAir 90 mcg inhalation 1 puff 4 times daily as needed for shortness of breath, Eucerin topical cream for dry skin, Lasix 40 mg p.o. daily, Colace 100 mg p.o. b.i.d., MiraLAX 17 grams as needed for constipation, Bactrim 800/160 p.o. b.i.d. for his leg ulcer, simethicone 80 mg p.o. q.4 hours as needed for gas pain, melatonin 3 mg p.o. at bedtime, Lumigan 0.01% ophthalmic solution 1 drop to each eye at nighttime, Prilosec 20 mg p.o. daily, levothyroxine 25 mcg p.o. daily, vitamin D 400 international units daily.   PHYSICAL EXAMINATION: VITAL SIGNS: Temperature 98.9 heart rate 83, respirations 18, blood pressure on arrival documented at 57/43; however, now 121/79 with minimal intervention, receiving 100 mL normal saline as described above. Saturating 92% on 2 liters nasal cannula. Weight 71.7 kg, BMI  23.4.  GENERAL: Chronically ill-appearing Caucasian  gentleman, currently in no acute distress.  HEAD: Normocephalic, atraumatic.  EYES: Pupils equal, round, reactive to light. Extraocular muscles intact. No scleral  icterus.  MOUTH: Moist mucous membranes. Dentition intact. No abscess noted.  EAR, NOSE, THROAT:  Clear without exudates. No external lesions.  NECK: Supple. No thyromegaly. No nodules. No JVD.  PULMONARY: Prolonged expiratory phase with expiratory wheezing. No actual rales or rhonchi. Good respiratory effort.  CHEST: Nontender to palpation.  CARDIOVASCULAR: S1 and S2. Irregular rate, irregular rhythm. No murmurs, rubs or gallops. No edema. Pedal pulses 2+ bilaterally.  GASTROINTESTINAL: Soft, nontender, nondistended. No masses. Positive bowel sounds. No hepatosplenomegaly.  MUSCULOSKELETAL: No swelling, clubbing or edema. Range of motion full in all extremities.  NEUROLOGIC: Cranial nerves II through XII intact. No gross focal deficits. Sensation intact. Reflexes intact.  SKIN: No ulceration, lesions, rash, cyanosis. Skin warm, dry. Turgor intact.  PSYCHIATRIC: Mood and affect within normal limits. The patient is awake, alert and oriented x 3. Insight and judgment intact.   LABORATORY DATA: EKG performed, Afib, rate controlled. No ST or T wave abnormalities. Chest x-ray performed, no acute cardiopulmonary process. Remainder of laboratory data: Sodium 138, potassium 4, chloride 106, bicarb 23, BUN 20, creatinine 1.54, glucose 118. LFTs: Albumin 3.2, AST 42, otherwise within normal limits. Troponin I less than 0.02. WBC 8.5, hemoglobin 14, platelets 215.   ASSESSMENT AND PLAN: A 79 year old gentleman with history of chronic obstructive pulmonary disease, coronary artery disease, congestive heart failure, presenting with shortness of breath, chest pain.  1.  Chest pain. Admit to telemetry under observational intermittent telemetry. Trend cardiac enzymes x 3. Initiate aspirin and statin therapy.  2.  Chronic obstructive pulmonary disease exacerbation. Provide supplemental O2 to keep SaO2 greater than 92%. DuoNeb treatments q.4 hours, incentive spirometry, as well as azithromycin given cough.  3.   Acute kidney injury. IV fluid hydration. Follow renal function and urine output.  4.  Hypothyroidism, Synthroid.  5.  Hypertension, Lopressor.  6.  Venous thromboembolism prophylaxis with heparin subQ.   The patient is DNR.   TIME SPENT: 45 minutes.    ____________________________ Cletis Athensavid K. Hower, MD dkh:dmm D: 08/25/2013 22:03:50 ET T: 08/25/2013 22:45:04 ET JOB#: [id number]  cc: Cletis Athensavid K. Hower, MD, <Dictator> DAVID Synetta ShadowK HOWER MD ELECTRONICALLY SIGNED 08/26/2013 0:05

## 2014-06-16 NOTE — Consult Note (Signed)
EGD done under conscious sedation. EGD showed distal esophagitis, possibly due to candidiasis. Cytology brushings done. Continue protonix for now. Added nystatin swish and swallow until cytology results are back. No stricture seen. Diet resumed. Will sign off. Thanks.  Electronic Signatures: Lutricia Feilh, Savayah Waltrip (MD)  (Signed on 13-Jul-15 14:32)  Authored  Last Updated: 13-Jul-15 14:32 by Lutricia Feilh, Nickey Canedo (MD)

## 2014-06-16 NOTE — Consult Note (Signed)
Pt seen and examined. Full consult to follow. Pt very hard of hearing. Agree that patient mainly has odynophagia and not chest pain. Unclear if patient has dysphagia or not. Takes prilosec daily. Ate breakfast before 7 AM this morning. Will try to schedule EGD with possible dilation this afternoon. May not be able to get anesthesia to help due to breakfast. Therefore, may need to use conscious sedation. thanks.   Electronic Signatures: Lutricia Feilh, Loella Hickle (MD)  (Signed on 13-Jul-15 12:18)  Authored  Last Updated: 13-Jul-15 12:18 by Lutricia Feilh, Stashia Sia (MD)

## 2014-06-16 NOTE — Discharge Summary (Signed)
PATIENT NAME:  Darrell Mcconnell, Darrell Mcconnell MR#:  [id number] DATE OF BIRTH:  05/31/1935  DATE OF ADMISSION:  01/28/2014 DATE OF DISCHARGE:  01/29/2014   PRIMARY CARE PHYSICIAN: Lyndon CodeFozia Mcconnell. Khan, MD  DISCHARGE DIAGNOSES:  1.  Hypotension due to medication.  2.  Chronic obstructive pulmonary disease with chronic hypoxic respiratory failure.  3.  Chronic systolic congestive heart failure.  4.  Coronary artery disease.   CONDITION: Stable.   CODE STATUS: DNR.   HOME MEDICATIONS: Please refer to the medication reconciliation list.   DIET: Low-sodium, low-fat, low-cholesterol diet.   ACTIVITY: As tolerated.   FOLLOW-UP CARE: Follow up with PCP within 1-2 weeks.   REASON FOR ADMISSION: Nausea and hypotension.   HOSPITAL COURSE: The patient is a 79 year old Caucasian male with a history of COPD, chronic respiratory failure, systolic CHF, who was sent to the ED due to nausea and hypotension. The patient developed some nausea and hypotension after receiving incorrect medication at the living facility. The patient's blood pressure was 101/64 with positive orthostatic hypotension signs. The patient was sent to the ED for further evaluation. The patient's blood pressure was 126/79 lying down, but decreased to 96/83 standing with a heart rate going from 60 to 83.   For detailed history and physical examination, please refer to the admission note dictated by Dr. Jerald KiefWelsh.   LABORATORY DATA: Unremarkable on admission date.   HOSPITAL COURSE:  1.  Hypotension due to medications. The patient's blood pressure has improved after admission. The patient has no symptoms. 2.  Chronic obstructive pulmonary disease and chronic systolic congestive heart failure is stable.   The patient has no complaints. Vital signs are stable. He is clinically stable and will be discharged back to the assisted living facility today.   I discussed the patient's discharge plan with the patient, nurse, social worker and case Production designer, theatre/television/filmmanager.   TIME  SPENT: About 35 minutes.    ____________________________ Shaune PollackQing Adonys Wildes, MD qc:MT D: 01/29/2014 12:04:18 ET T: 01/29/2014 12:18:03 ET JOB#: [id number]  cc: Shaune PollackQing Ashtyn Meland, MD, <Dictator> Shaune PollackQING Carey Lafon MD ELECTRONICALLY SIGNED 01/29/2014 17:22

## 2014-06-16 NOTE — Consult Note (Signed)
PATIENT NAME:  Darrell Mcconnell, Darrell Mcconnell MR#:  [id number] DATE OF BIRTH:  07/27/1935  DATE OF ADMISSION:  09/03/2013.  DATE OF CONSULTATION:  09/04/2013.  CONSULTING PHYSICIAN:  Ezzard Standingaul Y. Bluford Kaufmannh, MD  REASON FOR ADMISSION:  Atypical chest pain.   HISTORY OF PRESENT ILLNESS: The patient is a 79 year old white male who is a resident at a nursing home who came in yesterday with atypical chest pain. I was asked to see the patient because cardiac enzyme markers came back negative and the fact that his symptoms may be more GI-related rather than cardiac. He does have a history of coronary artery disease with previous episode of congestive heart failure. He also has a history of COPD in the past. Other history includes anxiety and depression. He felt a little short of breath at home. However, he is not short of breath right now.   He now tells me that his chest pain is typically after he drinks water or after he swallows food. The chest pain episode lasts about 5 minutes at a time. The patient is rather hard of hearing. It was difficult to elicit a good history. It is unclear whether he has dysphagia or not. He does admit to having dyspepsia and burping and belching for which he takes Prilosec daily.   Notable for coronary artery disease and congestive heart failure as well as COPD. Other history includes hyperlipidemia, anxiety, glaucoma, BPH, and history of abdominal aortic aneurysm.   HOME MEDICATIONS: Seroquel at bedtime, inhaler, multivitamins, and MiraLax. He also takes Prilosec 20 mg daily, Remeron 30 mg at bedtime, Lopressor 12.5 mg twice a day, melatonin 3 mg at bedtime, lisinopril 2.5 mg daily, Synthroid 25 mcg daily, Lasix 40 mg daily, Proscar 50 mg daily, Colace 100 mg twice a day, Lanoxin 0.125 mg daily, baby aspirin, Xanax and albuterol.   ALLERGIES: NO KNOWN DRUG ALLERGIES.   SOCIAL HISTORY: He quit smoking 10 years ago.   FAMILY HISTORY: Notable for heart disease and stroke.   REVIEW OF SYSTEMS: No change  from admission. Please refer to Dr. Judithann SheenSparks' notes.   PHYSICAL EXAMINATION:  GENERAL: The patient is in no acute distress.  VITAL SIGNS: Stable.  HEAD AND NECK: Within normal limits.  CARDIAC: Regular rhythm and rate.  LUNGS: Decreased breath sounds and some occasional rhonchi.  ABDOMEN: Normoactive bowel sounds, soft, nontender. There is no hepatomegaly.  EXTREMITIES: No clubbing, cyanosis, or edema.  NEUROLOGIC: Examination is nonfocal.  SKIN: Negative.   LABORATORY DATA: Liver enzymes are normal. Potassium 3.4 on admission, it is 4.0 today. Creatinine 1.33 today. CPK enzymes and troponin levels are all normal. Digoxin level 0.6. White count was 13.8 and is now 8.0. INR is normal at 1.0.   IMPRESSION AND RECOMMENDATIONS: This is a patient with atypical chest pain. What he describes sounds more like odynophagia rather than cardiac chest pain. Cardiac markers are negative. The patient has been n.p.o. since this morning. His last ate some scrambled eggs before 7:00. I will see whether we can have anesthesia help to sedate the patient and if they are not able to do so early in the afternoon, the patient is agreeable to having conscious sedation instead. If there is evidence of esophageal stricture, then esophageal dilation may need to be performed.   Thank you for the referral.    ____________________________ Ezzard StandingPaul Y. Bluford Kaufmannh, MD pyo:lt D: 09/04/2013 12:56:25 ET T: 09/04/2013 13:53:56 ET JOB#: [id number]  cc: Ezzard StandingPaul Y. Bluford Kaufmannh, MD, <Dictator> Ezzard StandingPAUL Y Vernon Ariel MD ELECTRONICALLY SIGNED 09/05/2013 10:35

## 2014-06-16 NOTE — H&P (Signed)
PATIENT NAME:  Darrell Mcconnell, Darrell Mcconnell MR#:  [id number] DATE OF BIRTH:  Jul 07, 1935  DATE OF ADMISSION:  01/28/2014  REFERRING PHYSICIAN: Dr. Dayton [facility]Murray at [facility].   PRIMARY CARE PHYSICIAN: Lyndon CodeFozia Mcconnell. Khan, MD at [facility].   CHIEF COMPLAINT: Nausea and hypotension.   HISTORY OF PRESENT ILLNESS: This very pleasant, 79 year old man with past medical history of COPD, systolic congestive heart failure, coronary artery disease, atrial fibrillation, presents today with nausea and hypotension after receiving incorrect medications at his living facility. Per the urgent care note, the patient received Norvasc 10 mg and losartan/hydrochlorothiazide 100/ 25 mg, which were meant for another resident of the living facility. Within an hour after receiving these medications, he became nauseated and weak. [facility] with a blood pressure of 101/64, positive orthostatic vital signs. Unfortunately his living facility is unable to monitor him closely. Due to high risk of fall and positive orthostatic vital signs, he is being admitted for observation. Additionally, as he left the urgent care, the final set of vitals showed a temperature of 101 degrees. These were called to me by Dr. Dayton ScrapeMurray. Orthostatics from urgent care indicate that blood pressure was 126/79 lying down, 96/83 standing with a heart rate that went from 60 to 83.   PAST MEDICAL HISTORY: 1. Chronic obstructive pulmonary disease.  2. Systolic congestive heart failure with ejection fraction of 35% to 40%.  3. Chronic hypoxic respiratory failure requiring 2 liters of oxygen via nasal cannula.  4. Atrial fibrillation.  5. Coronary artery disease.  6. Anxiety.  7. Gastroesophageal reflux disease.  8. Hypothyroidism.  9. Hyperlipidemia.  10. Glaucoma.  11. Aortic aneurysm seen on CT scan.  12. Depression, anxiety.  13. Dementia.  14. Hyperlipidemia.  15. Benign prostatic hypertrophy.   PAST SURGICAL  HISTORY: 1. [facility], [facility].  2. Prostate resection.  3. Abdominal aortic aneurysm repair.   ALLERGIES: No known allergies.   HOME MEDICATIONS: 1. Vitamin D3 - 400 international units 1 tablet daily.  2. Simethicone 80 mg 1 tablet every 4 hours as needed.  3. Quetiapine 50 mg 1 tablet once a day at bedtime.  4. Quetiapine 25 mg 1 tablet 3 times a day.  5. ProAir HFA CFC free 90 mcg/inhalations inhaler 1 puff inhaled 4 times a day as needed for shortness of breath.  6. Prenatal vitamin 1 tablet daily.  7. Polyethylene glycol 17 grams daily.  8. Oxygen 2 liters via nasal cannula.  9. Omeprazole 20 mg 1 capsule once a day.  10. Nystatin 100,000 units per 5 mL orally 4 times a day.  11. Mirtazapine 30 mg 1 tablet once a day in the evening.  12. Metoprolol tartrate 12.5 mg orally once a day.  13. Melatonin 3 mg 1 tablet once a day.  14. Maalox 30 mL orally twice a day.  15. Lumigan 0.01% one drop to each eye once a day.  16. Lisinopril 2.5 mg 1 tablet once a day.  17. Levothyroxine 25 mcg 1 tablet daily.  18. Furosemide 40 mg 1 tablet daily.  19. Finasteride 5 mg 1 tablet daily.  20. Eucerin apply topically to arms and legs twice a day.  21. Citalopram 10 mg 1.5 tablets once a day.  22. Docusate sodium 100 mg 1 tablet twice a day.  23. Digoxin 1.25 mcg 1 tablet once a day.  24. Aspirin 81 mg 1 tablet daily.  25. Alprazolam 0.25 mg 1 tablet every  6 hours as needed for anxiety.  26. Albuterol ipratropium 2.5 mg/0.5 mg/3 mL inhaled 4 times a day as needed for shortness of breath.  27. Advair Diskus 250 mg 1 puff inhaled every 12 hours.   SOCIAL HISTORY: The patient currently lives in a group home, [facility]. He does not smoke cigarettes or use illicit substances or drink alcohol.   FAMILY MEDICAL HISTORY: Positive for cancer in both parents.   REVIEW OF SYSTEMS: The patient reports he has been in his normal state of health until this morning. He  denies any recent cough, congestion, shortness of breath, fevers or chills, dysuria, nausea, vomiting, diarrhea, abdominal pain, confusion, focal numbness or weakness, swollen or tender joints, uncontrolled anxiety or depression.   PHYSICAL EXAMINATION: VITAL SIGNS: Temperature 97.7, pulse 92, respirations 20, blood pressure 126/72, oxygenation 94% on 2 liters nasal cannula.   GENERAL: No acute distress.   HEENT: Pupils equal, round and reactive to light. Conjunctivae clear. Extraocular motion intact. Mucous membranes pink and moist.  NECK: There is no cervical lymphadenopathy, trachea midline, thyroid nontender.   RESPIRATORY: Lungs are clear to auscultation bilaterally with good air movement.   CARDIOVASCULAR: Distant heart sounds, regular rate and rhythm. No murmurs, rubs or gallops. Peripheral pulses 1+. No peripheral edema.   ABDOMEN: Soft, nontender, nondistended. Bowel sounds are normal.   MUSCULOSKELETAL: Strength is 5/5 throughout. Range of motion is normal. No joint effusions or swollen tender joints.   NEUROLOGIC: Cranial nerves 2 through 12 grossly intact, nonfocal neurologic examination was strength and sensation intact and normal bilaterally.   PSYCHIATRIC: The patient is alert and oriented, in good spirits.   LABORATORY DATA: Sodium 141, potassium 4.0, chloride 101, bicarbonate 32, BUN 12, creatinine 1.24, glucose 106, calcium 9.0. LFTs are normal. White blood cells 11.1, hemoglobin 12.8, MCV 86, platelets 211,000.   IMAGING: Chest x-ray is pending.   ASSESSMENT AND PLAN: 1. Hypotension due to medication: The patient has received additional doses of antihypertensive medications that were meant for another resident of his living facility. He presented to urgent care with orthostatic hypotension, nausea and mild weakness. His living facility is unable to monitor him closely today and he is at high risk for falls given his orthostasis. Currently, he is comfortable. We will  monitor him on telemetry. His electrolytes are stable. We will get EKG. Given his low ejection fraction, we will be cautious about hydration. His nausea has resolved. We will observe overnight. I anticipate he will be discharged in the morning.  2. Chronic obstructive pulmonary disease with chronic hypoxic respiratory failure requiring 2 liters of nasal cannula. We will attempt to wean oxygen during this hospitalization. It was only started during his last hospitalization in October and I do not think it has been reassessed since that time. He is not having any signs of chronic obstructive pulmonary disease exacerbation at this time. Continue home regimen.  3. Depression and anxiety: Continue current medications. No signs of uncontrolled symptoms.  4. Coronary artery disease: The patient is asymptomatic, no chest pain.  5. Systolic heart failure: No signs of exacerbation at this time. Continue with current medication regimen. Heart healthy, low sodium diet.  6. Fever: Fever was reported by the urgent care. A temperature of 101 was called to me by Dr. Dayton Scrape. Given he was febrile and hypotensive at urgent care, I have ordered blood cultures,  chest x-ray and a urinalysis. I will not start antibiotics as at the time of presentation to the hospital, he  is not febrile and does not have any specific symptoms to suggest a source of infection.  7. Prophylaxis: We will not initiate pharmacological prophylaxis at this time. We will use TED hose and encourage ambulation once he is stable.   TIME SPENT ON ADMISSION: 40 minutes.     ____________________________ Ena Dawley. Clent Ridges, MD cpw:TT D: 01/28/2014 14:51:00 ET T: 01/28/2014 15:39:10 ET JOB#: [id number]  cc: Santina Evans P. Clent Ridges, MD, <Dictator> Gale Journey MD ELECTRONICALLY SIGNED 01/28/2014 18:41

## 2014-06-16 NOTE — Discharge Summary (Signed)
Dates of Admission and Diagnosis:  Date of Admission 27-Jul-2013   Date of Discharge 29-Jul-2013   Admitting Diagnosis COPD   Final Diagnosis 1. COPD exacerbation 2. Chronic diastolic chf 3. HTn 4. Dementia    Chief Complaint/History of Present Illness CHIEF COMPLAINT: Shortness of breath.   HISTORY OF PRESENT ILLNESS: The patient is a 79 year old white male very well known to our service with past medical history of COPD, congestive heart failure combined systolic and diastolic, depression, anxiety, early dementia, BPH, who last hospitalized here in August, who presents with complaint of shortness of breath and cough. The patient states that he has had progressive shortness of breath and cough for the past few days and has been having dyspnea on exertion. He has not had any fevers but has been having dry cough which is nonproductive. He does not have any chest pains or palpitations. He denies any swelling in his lower extremity. He denies any nausea, vomiting or diarrhea.   Allergies:  No Known Allergies:   Routine Chem:  06-Jun-15 04:25   Glucose, Serum  163  BUN  27  Sodium, Serum 138  Potassium, Serum 4.1  Chloride, Serum 104  CO2, Serum 28  Calcium (Total), Serum 8.9  Anion Gap  6  Osmolality (calc) 284  eGFR (African American) >60  eGFR (Non-African American)  56 (eGFR values <25m/min/1.73 m2 may be an indication of chronic kidney disease (CKD). Calculated eGFR is useful in patients with stable renal function. The eGFR calculation will not be reliable in acutely ill patients when serum creatinine is changing rapidly. It is not useful in  patients on dialysis. The eGFR calculation may not be applicable to patients at the low and high extremes of body sizes, pregnant women, and vegetarians.)  Routine Hem:  06-Jun-15 04:25   WBC (CBC)  15.4  RBC (CBC) 4.90  Hemoglobin (CBC) 14.0  Hematocrit (CBC) 42.4  Platelet Count (CBC) 207  MCV 86  MCH 28.5  MCHC 32.9  RDW   16.4  Neutrophil % 92.2  Lymphocyte % 5.0  Monocyte % 2.6  Eosinophil % 0.0  Basophil % 0.2  Neutrophil #  14.2  Lymphocyte #  0.8  Monocyte # 0.4  Eosinophil # 0.0  Basophil # 0.0 (Result(s) reported on 29 Jul 2013 at 05:21AM.)   Pertinent Past History:  Pertinent Past History PAST MEDICAL HISTORY:  1.  Chronic obstructive pulmonary disease.  2.  Hypertension.  3.  History of congestive heart failure, which is combined with both systolic and diastolic dysfunction, EF 409%  4.  Coronary artery disease.  5.  GERD.  6.  Hypothyroidism.  7.  Glaucoma.  8.  BPH.  9.  Anxiety, depression.   Hospital Course:  Hospital Course SSESSMENT AND PLAN: The patient is a 79year old white male with multiple medical problems presents with shortness of breath. 1.  Acute respiratory failure, likely due to acute chronic obstructive pulmonary disease exacerbation.  improved, continue solumderol, nebs, iv abx On day of d/c no O2, No weezing 2.  Congestive heart failure, which is mixed diastolic and systolic, currently well compensated. continue oral Lasix.  3.  Bipolar disorder with anxiety.  continue his alprazolam, quetiapine, as well as mirtazapine. 4.  Hypertension.  continue metoprolol and lisinopril  5.  Hypothyroidism. Continue levothyroxine. 6.  Benign prostatic hypertrophy. Continue finasteride.  7.  Gastroesophageal reflux disease. We will continue omeprazole. 8.  Miscellaneous. The patient was on Lovenox for deep vein thrombosis prophylaxis. Lovenox  Time spent  on d/c 35 minutes   Condition on Discharge Fair   Code Status:  Code Status No Code/Do Not Resuscitate   PHYSICAL EXAM ON DISCHARGE:  Physical Exam:  GEN no acute distress, thin   NECK supple  No masses   RESP normal resp effort  clear BS   CARD regular rate  No LE edema   VITAL SIGNS:  Vital Signs: **Vital Signs.:   06-Jun-15 05:18  Vital Signs Type Routine  Temperature Temperature (F) 98.5  Celsius 36.9   Temperature Source oral  Pulse Pulse 71  Respirations Respirations 20  Systolic BP Systolic BP 643  Diastolic BP (mmHg) Diastolic BP (mmHg) 86  Mean BP 102  Pulse Ox % Pulse Ox % 93  Pulse Ox Activity Level  At rest  Oxygen Delivery Room Air/ 21 %    08:52  Vital Signs Type Routine  Temperature Temperature (F) 98  Celsius 36.6  Temperature Source axillary  Pulse Pulse 88  Pulse source if not from Vital Sign Device per Telemetry Clerk; AFib  Respirations Respirations 18  Systolic BP Systolic BP 329  Diastolic BP (mmHg) Diastolic BP (mmHg) 77  Mean BP 95  Pulse Ox % Pulse Ox % 91  Pulse Ox Activity Level  At rest  Oxygen Delivery Room Air/ 21 %    11:26  Vital Signs Type Routine  Temperature Temperature (F) 98.1  Celsius 36.7  Temperature Source oral  Pulse Pulse 82  Respirations Respirations 20  Systolic BP Systolic BP 518  Diastolic BP (mmHg) Diastolic BP (mmHg) 86  Mean BP 108  Pulse Ox % Pulse Ox % 93  Pulse Ox Activity Level  At rest  Oxygen Delivery Room Air/ 21 %   DISCHARGE INSTRUCTIONS HOME MEDS:  Medication Reconciliation: Patient's Home Medications at Discharge:     Medication Instructions  digoxin 125 mcg (0.125 mg) oral tablet  1 tab(s) orally once a day   quetiapine 25 mg oral tablet  1 tab orally 3 times a day at 6am, 12n, and 5pm.   preplus prenatal multivitamins with folic acid 1 mg oral tablet  1 tab(s) orally once a day   finasteride 5 mg oral tablet  1 tab(s) orally once a day   omeprazole 20 mg oral delayed release capsule  1 cap(s) orally once a day   docusate sodium sodium 100 mg oral capsule  1 cap(s) orally 2 times a day   levothyroxine 25 mcg (0.025 mg) oral tablet  1 tab(s) orally once a day   escitalopram 10 mg oral tablet  1.5 tabs (15m) orally once a day.   vitamin d3 400 intl units oral tablet  1 tab(s) orally once a day   melatonin 3 mg oral tablet  1 tab(s) orally once (at bedtime)   tylenol 650 mg oral tablet, extended release  1  tab(s) orally 2 times a day as needed at 9am and 7pm.   proair hfa cfc free 90 mcg/inh inhalation aerosol  1 puff(s) inhaled 4 times a day as needed for shortness of breath/wheezing.    simethicone 80 mg oral tablet  1 tab(s) orally every 4 hours as needed for gas.   eucerin - topical cream  Apply topically to arms and legs 2 times a day for dry skin.   lisinopril 2.5 mg oral tablet  1 tab(s) orally once a day for high blood pressure   mirtazapine 30 mg oral tablet  1 tab(s) orally once a day (in the evening) at 7pm.  lumigan 0.01% ophthalmic solution  1 drop(s) into each eye once a day at 7pm.   advair diskus 250 mcg-50 mcg inhalation powder  1 puff(s) inhaled every 12 hours. *rinse mouth after use*   alprazolam 0.25 mg oral tablet  1 tab(s) orally every 8 hours for anxiety.   aspirin enteric coated 81 mg oral delayed release tablet  1 tab(s) orally once a day at 7pm. *do not crush*   quetiapine 50 mg oral tablet  1 tab(s) orally once a day (at bedtime)   albuterol 2.5 mg/3 ml (0.083%) inhalation solution  1 vial (3 milliliters) mixed with ipatropium via nebulizer every 6 hours as needed for shortness of breath/ wheezing   acetaminophen-hydrocodone 325 mg-5 mg oral tablet  1 tab(s) orally every 6 hours as needed for pain   furosemide 40 mg oral tablet  1 tab(s) orally once a day (in the morning)   metoprolol tartrate 25 mg oral tablet  0.5 tab (12.59m) orally once a day   levaquin 500 mg oral tablet  1 tab(s) orally once a day   prednisone 10 mg oral tablet  Start at 60 mg and taper by 10 mg daily until complete     Physician's Instructions:  Diet Low Sodium   Activity Limitations As tolerated   Return to Work Not Applicable   Time frame for Follow Up Appointment 1-2 weeks  PCP   Electronic Signatures: Kemora Pinard, SLottie Dawson(MD)  (Signed 08-Jun-15 21:25)  Authored: ADMISSION DATE AND DIAGNOSIS, CHIEF COMPLAINT/HPI, Allergies, PERTINENT LABS, PERTINENT PAST HISTORY, HOSPITAL COURSE,  PHYSICAL EXAM ON DISCHARGE, VITAL SIGNS, DISCHARGE INSTRUCTIONS HOME MEDS, PATIENT INSTRUCTIONS   Last Updated: 08-Jun-15 21:25 by SAlba Destine(MD)

## 2014-06-16 NOTE — Discharge Summary (Signed)
PATIENT NAME:  Darrell Mcconnell, Darrell Mcconnell MR#:  [id number] DATE OF BIRTH:  [birth year]  DATE OF ADMISSION:  12/21/2013 DATE OF DISCHARGE:  12/22/2013  DISCHARGE DIAGNOSES:  1. Chronic obstructive pulmonary disease exacerbation.  2. History of congestive heart failure, systolic.  3. History of coronary artery disease.  4. Anxiety.  5. Atrial fibrillation.  6. Gastroesophageal reflux disease.  7. Hypothyroidism.  8. Hyperlipidemia.  9. Glaucoma.  10. Aortic aneurysm seen on CT scan.   CONSULTATIONS: None.   PROCEDURES: 1. CT angiography 12/20/2013, of the chest shows no evidence of pulmonary embolus. Mild emphysematous COPD, upper abdominal aortic aneurysm measuring up to 4.6 cm. It is stable. Small bilateral pleural effusions with compressive atelectasis in lower lobes.  2. Chest x-ray October 28 showed stable atelectasis versus infiltrate in the posterior lower right lobe. Mild cardiomegaly. No evidence of congestive heart failure.   HISTORY OF PRESENT ILLNESS: This 79 year old man who was discharged 77 hours ago from the hospital after admission for COPD exacerbation returns with acute respiratory failure, COPD exacerbation. He was discharged home yesterday. Last night, he developed a coughing fit after eating dinner. He awoke at 2:00 a.m. short of breath, wheezing, and has been coughing ever since. He presents to the Emergency Room for further evaluation. At the time of presentation to the Emergency Room, he has received several nebulizer treatments without much improvement.   HOSPITAL COURSE BY PROBLEM:  1. Acute chronic obstructive pulmonary disease exacerbation Mr. Rison was treated with standard chronic obstructive pulmonary disease protocol including IV Solu-Medrol, Levaquin for antibiotics, nebulizer treatments with albuterol and ipratropium every 6 hours. He required oxygen in the hospital. Prior to discharge, physical therapy worked with him and documented oxygen saturation of 91% on room air.  However, during ambulation decreased to 86% and does not rebound with standing rest. He is being discharged on nasal cannula at 2 liters to improve his oxygenation. At the time of discharge, he is comfortable, no coughing. He will continue at home with an oral steroid taper, oral Levaquin, oxygen via nasal cannula. He has a good regimen for chronic obstructive pulmonary disease of Advair daily and albuterol as needed. 2. History of congestive heart failure: There was no sign of congestive heart failure exacerbation during this admission.  3. History of coronary artery disease: He had no chest pain or signs of angina during admission.  4. Anxiety, depression, mild dementia: He will continue his home psychiatric medications. He is in a group living situation and has his medications distributed by that facility.  5. Atrial fibrillation: Continues on digoxin and metoprolol. Rate has been controlled throughout hospitalization.   DISCHARGE PHYSICAL EXAMINATION:  VITAL SIGNS: Temperature 98.3, pulse 77, respirations 19, blood pressure 158/90, oxygenation 94% on 2 liters.  GENERAL: No acute distress.  PULMONARY: Good air movement. No respiratory distress. Scattered wheezes.  CARDIOVASCULAR: Regular rate and rhythm, no murmurs, rubs, or gallops, no peripheral edema. Peripheral pulses are 1+.  PSYCHIATRIC: Alert and oriented x4 with good insight into his medical situation.  LABORATORY DATA: Sodium 143, potassium 3.8, chloride 105, bicarbonate 31, BUN 31, creatinine 1.08. LFTs are normal. White count 7.7, hemoglobin 13.6, platelets 239, MCV 89. UA shows no sign of infection.   DISCHARGE MEDICATIONS:  1. Oxygen 2 liters by nasal cannula.  2. Levofloxacin 500 mg 1 tablet orally once a day in the evening for 4 days.  3. Prednisone 10 mg 6 tablets orally once a day for 2 days and then decrease by 10 mg every  2 days and then stop.  4. Acetaminophen 325 mg 2 tabs every 4 hours as needed for pain.  5. Alprazolam  0.25 mg 1 tablet every 6 hours as needed for anxiety.  6. Diazepam 5 mg 1 tablet 4 times a day as needed for anxiety.  7. Melatonin 3 mg 1 tablet once a day at bedtime.  8. Mupirocin topical 2% ointment apply to affected area 3 times a day.  9. Albuterol-ipratropium 2.5 mg-0.5 mg in 3 mL inhalation solution 3 mL inhaled every 6 hours as needed for shortness of breath.  10. Metoprolol tartrate 25 mg 0.5 tablet once a day.  11. Docusate sodium 100 mg 1 capsule twice a day.  12. Aspirin 81 mg 1 tablet once a day.  13. Lumigan 0.01% ophthalmic solution 1 drop into each eye once a day in the evening.  14. Maalox 30 mL 2 times a day.  15. Polyethylene glycol 17 grams once a day.  16. Furosemide 40 mg 1 tablet once a day.  17. Quetiapine 50 mg 1 tablet once a day at bedtime.  18. Advair Diskus 250 mcg-50 mcg 1 puff every 12 hours.  19. Mirtazapine 30 mg 1 tablet once a day in the evening.  20. Lisinopril 2.5 mg 1 tablet once a day.  21. Eucerin topical cream applied to arms and legs twice a day.  22. Simethicone 80 mg 1 tablet every 4 hours as needed for gas.  23. Pro-Air, HFA CFC free 90 mcg  inhaler 1 puff inhaled 4 times a day as needed for shortness of breath.  24. Vitamin D3 at 400 international units 1 tablet once a day.  25. Escitalopram 10 mg 1.5 tablets once a day.  26. Levothyroxine 25 mcg 1 tablet once a day.  27. Omeprazole 20 mg 1 capsule once a day.  28. Finasteride 5 mg 1 tablet once a day.  29. Prenatal multivitamin with folic acid 1 mg 1 tablet once a day.  30. Quetiapine 25 mg 1 tab orally 3 times a day.  31. Digoxin 125 mcg 1 tablet once a day.   CONDITION ON DISCHARGE: Stable.   DISPOSITION: Discharge to living facility.   DISCHARGE INSTRUCTIONS:  DIET: Heart healthy, low sodium diet.  ACTIVITY: No restrictions, you should wear your oxygen when active. FOLLOWUP: Follow up with your primary care provider within 2 weeks of discharge.  TIME SPENT THIS DISCHARGE: 35  minutes.   ____________________________ Ena Dawleyatherine P. Clent RidgesWalsh, MD cpw:jh D: 12/25/2013 09:03:18 ET T: 12/25/2013 15:43:11 ET JOB#: [id number]  cc: Santina Evansatherine P. Clent RidgesWalsh, MD, <Dictator> Gale JourneyATHERINE P WALSH MD ELECTRONICALLY SIGNED 01/05/2014 11:44

## 2014-06-16 NOTE — H&P (Signed)
PATIENT NAME:  Darrell Mcconnell, Darrell Mcconnell MR#:  [id number] DATE OF BIRTH:  Dec 31, 1935  DATE OF ADMISSION:  12/20/2013   CHIEF COMPLAINT: Shortness of breath.   HISTORY OF PRESENT ILLNESS: This is a 79 year old man who was just discharged from the hospital yesterday after coming in with acute respiratory failure, pneumonia, COPD exacerbation. He was discharged home yesterday, started developing coughing fits after he ate dinner. Had a bad night, woke up at 2:00 a.m., was short of breath, wheezing, coughing all day.  Came in for further evaluation. In the ER, was given a few nebulizer treatments without much result and hospitalist services were contacted for further evaluation.   PAST MEDICAL HISTORY: Coronary artery disease, systolic congestive heart failure, pneumonia, hyperlipidemia, osteoarthritis, anxiety, depression, dementia, transient atrial fibrillation, glaucoma, BPH, history of AAA, hypothyroidism, gastroesophageal reflux disease.   PAST SURGICAL HISTORY: CABG, AAA repair, prostate surgery.   ALLERGIES: No known drug allergies.   MEDICATIONS: As per prescription writer include Advair Diskus 250/50 one inhalation twice a day, albuterol ipratropium 2.5/5 at 3 mL every 6 hours as needed for shortness of breath, Xanax 0.25 mg every 8 hours and every 6 hours as needed for anxiety and nervousness, aspirin 81 mg daily, Valium 5 mg 4 times a day as needed for anxiety and nervousness, digoxin 125 mg daily, Colace 100 mg twice a day, Lexapro 15 mg daily, Eucerin topical cream applied to arms and legs twice a day, finasteride 5 mg daily, Lasix 40 mg in the morning, levofloxacin 500 mg daily, levothyroxine 25 mcg daily, lisinopril 2.5 mg daily, Lumigan 0.01% ophthalmic solution 1 drop each eye in the evening, Maalox 30 mL twice a day, melatonin 3 mg at bedtime, metoprolol 25 mg 1/2 tablet daily, Remeron 30 mg at nighttime, mupirocin topical ointment applied to affected area 3 times a day, omeprazole 20 mg daily, MiraLax  17 grams daily. Prednisone taper, PrePlus prenatal vitamin with folic acid 1 tablet daily, ProAir CFC 1 puff 4 times a day, Seroquel 25 mg 3 times a day, 50 mg at night; simethicone 80 mg every 4 hours as needed for pain, vitamin D3 400 IU daily.   SOCIAL HISTORY: Lives at the [facility] No current smoking or alcohol drug use.   FM:  Both parents died of cancer, unknown type.   REVIEW OF SYSTEMS:   CONSTITUTIONAL: Positive for fever. Positive for sweats. Positive for weakness.   HEENT:  Ears:  Decreased hearing.  Positive for runny nose. Positive for dry mouth.  Eyes:  No blurry vision.  CARDIOVASCULAR: No chest pain. No palpitations.  RESPIRATORY: Positive for shortness of breath. Positive for cough. No sputum. No hemoptysis.  GASTROINTESTINAL: Positive for vomiting. Positive for abdominal pain. Positive for constipation. No bright red blood per rectum. No melena.  GENITOURINARY: No burning on urination or hematuria.  MUSCULOSKELETAL: No joint pain.  INTEGUMENT: The patient states that he may have a rash on the hip.  NEUROLOGIC: No fainting or blackouts.  PSYCHIATRIC: Positive for anxiety.  ENDOCRINE: No thyroid problems.  HEMATOLOGIC AND LYMPHATIC: No anemia, no easy bruising or bleeding.   PHYSICAL EXAMINATION:  VITAL SIGNS: Temperature 98, pulse 86, respirations 24, blood pressure 164/91, pulse oximetry 97% on oxygen.  GENERAL: No respiratory distress.  EYES: Conjunctivae and lids normal. Pupils equal, round, and reactive to light. Extraocular muscles intact. No nystagmus.  EARS, NOSE, MOUTH AND THROAT: Tympanic membranes: No erythema. Nasal mucosa: No erythema.  Throat:  No erythema. No exudate seen. Lips and gums:  No lesions.  NECK: No JVD. No bruits. No lymphadenopathy. No thyromegaly. No thyroid nodules palpated.  RESPIRATORY: Decreased breath sounds bilaterally. Slight expiratory wheeze. No use of accessory muscles to breathe.  CARDIOVASCULAR SYSTEM: S1, S2 normal. No  gallops, rubs, or murmurs heard. Carotid upstroke 2+ bilaterally. No bruits.  EXTREMITIES: Dorsalis pedis pulses 2+ bilaterally. No edema of the lower extremity.  ABDOMEN: Soft, nontender. No organomegaly/splenomegaly. Normoactive bowel sounds. No masses felt.  LYMPHATIC: No lymph nodes in the neck.  MUSCULOSKELETAL: No clubbing, edema or cyanosis.  SKIN: Surgery followed on last issue on chronic wound on the lower extremity.  PSYCHIATRIC: The patient is alert, oriented to person, place, and time.  NEUROLOGIC: Cranial nerves II through XII grossly intact. Deep tendon reflexes 1+ bilateral lower extremity.   LABORATORY AND RADIOLOGICAL DATA: CT scan of the chest negative for PE, shows an aneurysm in the chest, small bilateral pleural effusions with compressive atelectasis in the lower lobes even though pneumonia commented on at this point. Glucose 174, BUN 27, creatinine 1.27, sodium 142, potassium 3.9, chloride 102, CO2 of 31, calcium 8.8. Liver function tests: AST slightly elevated at 58. White blood cell count 7.7, hemoglobin and hematocrit 13.2 and 40.3, platelet count of 238,000. Troponin negative. BNP 6271, digoxin 0.62, magnesium negative. D-dimer elevated at 3590.   EKG: Atrial fibrillation 100 beats per minute, left axis deviation, nonspecific ST-T wave changes.   ASSESSMENT AND PLAN:  1. Chronic obstructive pulmonary disease exacerbation, was recently here, was sent home on a prednisone taper. I will bring in as an observation only, give IV steroids while here. Continue the p.o. Levaquin. Continue nebulizer treatments.  2. History of congestive heart failure systolic. No signs on this hospital stay. Continue oral medications.  3. History of coronary artery disease. No complaints of chest pain at this time.  4. Anxiety, depression, mild dementia may be playing a roll.  Continue usual psychiatric medications.  5. Atrial fibrillation on digoxin and metoprolol. Continue to monitor.   6. Gastroesophageal reflux disease, on omeprazole as an outpatient. We will continue Protonix while here.  7. Hypothyroidism. Continue low-dose levothyroxine.  8. Hyperlipidemia, on statin.  9. Glaucoma: Continue eye drops.  10. Aneurysm seen on CT scan. The patient wants no further treatment of this.    TIME SPENT ON ADMISSION: 55 minutes.   The patient is a DNR.    ____________________________ Herschell Dimesichard J. Renae GlossWieting, MD rjw:by D: 12/20/2013 20:27:00 ET T: 12/20/2013 20:41:30 ET JOB#: [id number]  cc: Herschell Dimesichard J. Renae GlossWieting, MD, <Dictator> Salley ScarletICHARD J Sherhonda Gaspar MD ELECTRONICALLY SIGNED 12/25/2013 13:28

## 2014-06-16 NOTE — Consult Note (Signed)
PATIENT NAME:  Darrell Mcconnell, Darrell Mcconnell MR#:  [id number] DATE OF BIRTH:  May 19, 1935  DATE OF CONSULTATION:  08/26/2013  REFERRING PHYSICIAN:   CONSULTING PHYSICIAN:  Chellsea Beckers K. Guss Bunde, MD  SEX: Male.  RACE: White.  SUBJECTIVE: Patient was seen in Room #254. The patient is a 79 year old white male retired after working for [employer] in [employer] for many years. The patient is single since his wife died in 23-Dec-2014after 56 years of marriage. Patient reports that he has been living at the retirement home for the past 3 years, as it is too difficult for his wife to take care of him, as he has multiple physical problems and she has her own. They were calling each other and they were in touch with each other every single day. Patient reports that he has been feeling lonely and depressed since his wife died. In fact, the sitter stated that he was very glad that he has a sitter to talk to, as he has been feeling very lonely.   CHIEF COMPLAINT: "I made a mistake. I called the nurse immediately and told her. I don't know. At that moment, I felt bad and depressed, but right now, it was a mistake. I should not have done it. I am not suicidal, never been that way."  HISTORY OF PRESENT ILLNESS: As stated above, the patient reports that he has multiple physical problems and he came into the hospital this time for pneumonia and to be taken care of for the same.  PAST PSYCHIATRIC HISTORY: History of inpatient hold on psychiatry at [facility] for 7 months for a nervous breakdown and depression, and getting his medications adjusted. Denies any history of suicide attempts. The patient reports that he is being followed by Dr. Janeece Riggers in [place], [place]. Last appointment was 4 months ago. Next appointment coming up in 2 months. He takes care of all of his medications.  ALCOHOL AND DRUGS: Denies drinking alcohol. Denies street or prescription drug abuse. Denies smoking nicotine cigarettes.  MENTAL  STATUS EXAMINATION: The patient is seen lying in bed, very uncomfortable because of breathing problems, being restless because of the same. Sitter reports that he ventilated his feelings and stated that he was very glad he had a sitter because he could talk to her. Alert and oriented to place, person and time. He knew the capital of [address], capital of the [place], name of the current Economist. Affect is appropriate with his mood, which is low and down. States why he is feeling lonely, that he cannot call his wife like he used to do as she died in 2013-02-14. No psychosis. Denies auditory or visual hallucinations or paranoid thinking. Denies any thought  insertion or thought control. Cognition is intact. General fund of knowledge and information is fair. He could count money. Memory and recall are good and he can remember all 3 objects that I asked him to remember after a few minutes and several minutes. Regarding judgment, for fire, he said, "They gave [place] fire drill classes." Insight and judgment guarded. Impulse control is poor.  IMPRESSION: Major depression, recurrent, with poor impulse control. Impulse control disorder. Recommend continue current medications that he has been taking at the retirement home. Discussed with the nurse about changing and increasing his Xanax 0.25 mg p.o. 4 times a day as the patient reports the Xanax helps him with his anxiety. Continue 1-on-1 observation for now and will reevaluate tomorrow, 08/27/2013, if it is going to  be appropriate to discharge the patient.   ____________________________ Jannet MantisSurya K. Guss Bundehalla, MD skc:lm D: 08/26/2013 16:28:32 ET T: 08/26/2013 23:51:43 ET JOB#: [id number]  cc: Monika SalkSurya K. Guss Bundehalla, MD, <Dictator> Beau FannySURYA K Stanislawa Gaffin MD ELECTRONICALLY SIGNED 08/27/2013 18:26

## 2014-06-16 NOTE — H&P (Signed)
PATIENT NAME:  Darrell Mcconnell, Darrell Mcconnell MR#:  [id number] DATE OF BIRTH:  [birth year]  DATE OF ADMISSION:  07/27/2013  PRIMARY CARE PROVIDER: Dr. Beverely Risen.  EMERGENCY DEPARTMENT REFERRING PHYSICIAN: Dr. Sharyn Creamer.   CHIEF COMPLAINT: Shortness of breath.   HISTORY OF PRESENT ILLNESS: The patient is a 79 year old white male very well known to our service with past medical history of COPD, congestive heart failure combined systolic and diastolic, depression, anxiety, early dementia, BPH, who last hospitalized here in August, who presents with complaint of shortness of breath and cough. The patient states that he has had progressive shortness of breath and cough for the past few days and has been having dyspnea on exertion. He has not had any fevers but has been having dry cough which is nonproductive. He does not have any chest pains or palpitations. He denies any swelling in his lower extremity. He denies any nausea, vomiting or diarrhea.  PAST MEDICAL HISTORY:  1.  Chronic obstructive pulmonary disease.  2.  Hypertension.  3.  History of congestive heart failure, which is combined with both systolic and diastolic dysfunction, EF 40%.  4.  Coronary artery disease.  5.  GERD.  6.  Hypothyroidism.  7.  Glaucoma.  8.  BPH.  9.  Anxiety, depression.   PAST SURGICAL HISTORY: 1.  Status post CABG.  2.  Status post AAA repair.   ALLERGIES: None.  CURRENT MEDICATIONS:  At home: Vitamin D3 400 international units daily, Tylenol 650 mg 1 tab p.o. b.i.d. p.r.n., simethicone 80 mg 1 tab p.o. q.4 p.r.n., quetiapine 50 mg at bedtime,> 25 mg 1 tab p.o. t.i.d., ProAir 1 puff 4 times a day as needed, Preplus prenatal vitamins daily, omeprazole 20 daily, mirtazapine 30 at bedtime, metoprolol tartrate 12.5 daily, melatonin 3 mg at bedtime, Lumigan 0.01% 1 drop to each eye at 7:00 p.m., lisinopril 2.5 daily, levothyroxine 25 mcg daily, Lasix 40 daily, finasteride 5 mg daily, Lexapro 15 mg daily, docusate sodium 1 tab  p.o. b.i.d., digoxin 125 mcg daily, aspirin 81 mg 1 tab p.o. daily, alprazolam   q.8 p.r.n., albuterol nebulizers q.6 p.r.n., Advair 250/50 one puff b.i.d., > 650 q.6 p.r.n.   SOCIAL HISTORY: Used to smoke but no further smoking. Currently resides in a retirement home.   FAMILY HISTORY: Parents had heart disease, died from old age.  REVIEW OF SYSTEMS:  CONSTITUTIONAL: Complains of generalized weakness, fatigue. No fevers.  EYES: Denies any blurred vision. Has a history of glaucoma. No cataracts. EARS, NOSE AND THROAT: Denies any tinnitus, ear pain, hearing loss or epistaxis.  RESPIRATORY: Complains of cough which is dry. Complains of wheezing. Complains of dyspnea on exertion. No hemoptysis. Has a history of COPD.  CARDIOVASCULAR: Denies any chest pain. Denies any PND. Denies any palpitations or syncope.  GASTROINTESTINAL: No nausea, vomiting, diarrhea. No abdominal pain. No hematemesis or melena.  GENITOURINARY: Denies any dysuria, hematuria, renal calculus or frequency.  ENDOCRINE: Denies any polyuria, nocturia.  HEMATOLOGIC: Denies anemia, easy bruisability or bleeding.  SKIN: No acne or rash.  MUSCULOSKELETAL: Denies any neck, back or shoulder pain. NEUROLOGIC:  No numbness, CVA, TIA.  PSYCHIATRIC: Has a history of anxiety, depression.   PHYSICAL EXAMINATION: VITAL SIGNS: Temperature 98.9, pulse 98, respirations 22, blood pressure 122/63, O2 93% 2 liters.  GENERAL: The patient is a well-developed male, appears a little anxious.  HEENT: Head atraumatic, normocephalic. Pupils equally round, reactive to light and accommodation. There is no conjunctival pallor. No scleral icterus. Nasal exam shows no  drainage or ulceration. Oropharynx is clear without any exudate.  NECK: Supple without any JVD. No carotid bruits.  CARDIOVASCULAR: S1, S2 positive. No murmurs, rubs, clicks or gallops.  LUNGS: Diminished breath sounds bilaterally without any rales, rhonchi.  ABDOMEN: Soft, nontender,  nondistended. Positive bowel sounds x 4.  EXTREMITIES: No clubbing, cyanosis or edema.  SKIN: No rash.  LYMPHATICS: No lymph nodes palpable.  NEUROLOGIC: Cranial nerves II through XII grossly intact. No focal deficits.  PSYCHIATRIC: The patient awake, alert, not anxious or depressed.  EVALUATIONS:  Glucose 94, BUN 15, creatinine 1.13. Sodium 142, potassium 3.6, chloride 104, CO2 of 32, calcium 8.9. Troponin less than 0.02 x 3. Digoxin 0.56, WBC 14.2, hemoglobin 14.9, platelet count 210.   ASSESSMENT AND PLAN: The patient is a 79 year old white male with multiple medical problems presents with shortness of breath. 1.  Acute respiratory failure, likely due to acute chronic obstructive pulmonary disease exacerbation. At this time, we will treat him with IV Solu-Medrol, place him on DuoNeb. We will continue inhalers as taking at home. I will place him on empiric antibiotics in light of his chronic obstructive pulmonary disease exacerbation with Levaquin.  2.  Congestive heart failure, which is mixed diastolic and systolic, currently well compensated. We will continue oral Lasix.  3.  Bipolar disorder with anxiety. Will continue his alprazolam, quetiapine, as well as mirtazapine. 4.  Hypertension. We will continue metoprolol as taking at home, as well as lisinopril.  5.  Hypothyroidism. Continue levothyroxine. 6.  Benign prostatic hypertrophy. Continue finasteride.  7.  Gastroesophageal reflux disease. We will continue omeprazole. 8.  Miscellaneous. The patient will be on Lovenox for deep vein thrombosis prophylaxis.    TIME SPENT: 50 minutes.    ____________________________ Lacie ScottsShreyang H. Allena KatzPatel, MD shp:ce D: 07/27/2013 14:37:26 ET T: 07/27/2013 15:59:01 ET JOB#: [id number]  cc: Kourtland Coopman H. Allena KatzPatel, MD, <Dictator> Charise CarwinSHREYANG H Lorissa Kishbaugh MD ELECTRONICALLY SIGNED 07/29/2013 14:35

## 2014-06-16 NOTE — Discharge Summary (Signed)
PATIENT NAME:  Darrell Mcconnell, Darrell Mcconnell MR#:  [id number] DATE OF BIRTH:  10-Feb-1936  DATE OF ADMISSION:  12/15/2013 DATE OF DISCHARGE:  12/19/2013  DISCHARGE DIAGNOSES:  1.  Community-acquired pneumonia.  2.  Acute exacerbation of chronic obstructive pulmonary disease.  3.  Atrial fibrillation.  4.  History of systolic congestive heart failure.  5.  Skin infection on the right thigh.  6.  Benign prostatic hypertrophy.  7.  Hypothyroidism.  8.  Dementia.  9.  Depression.   CONSULTATIONS: Christopher A. Lundquist, MD, surgery.   PROCEDURES: Abdominal x-ray October 22 shows no acute findings, mild gaseous distention. Chest x-ray October 23 shows patchy left lower lobe opacity, atelectasis versus pneumonia.   HISTORY OF PRESENT ILLNESS: This 79 year old man with a history of COPD, CHF presents to the Emergency Room with shortness of breath. He is alert and awake and in no acute distress. He describes cough with sputum production, shortness of breath. No fevers or chills. On presentation he is hypoxic, with oxygen saturation of 88% on 4 L. He was placed initially on BiPAP in the Emergency Room and improved quickly. He was then admitted to the regular medical floor. He was treated with Zithromax and Rocephin due to possible infiltrate seen on chest x-ray.  HOSPITAL COURSE: By problem:  1.  Acute COPD exacerbation: The patient continued to require supplemental oxygen until the day of discharge. He was transitioned to Levaquin once he arrived on the floor. He was treated with IV steroids and nebulizers inpatient and is discharged on a steroid taper with p.o. Levaquin and home inhalers. On day of discharge, his oxygen saturation is greater than 90% at rest and with activity, and he does not require home O2.  2.  Community-acquired pneumonia: He was treated with Levaquin for community-acquired pneumonia seen on chest x-ray. Sputum and blood cultures are negative.  3.  Atrial fibrillation: Rate is controlled  throughout hospitalization. He continues on Lopressor and aspirin. He is a high fall risk and is not a candidate for anticoagulation.  4.  History of systolic congestive heart failure: He showed no signs of CHF exacerbation while hospitalized. Continue Lasix and lisinopril as well as digoxin.  5.  Skin infection: The patient has had a biopsy of the left lateral thigh. Pathology shows a granuloma versus hemangioma versus nonspecific skin ulcer. The patient described discomfort at that site and some edema was noted. Surgery was consulted for possible bedside I and D, but this procedure was not needed. At the time of discharge there is no purulent drainage or pain. No signs of infection.   DISCHARGE PHYSICAL EXAMINATION:  VITAL SIGNS: Temperature 97.7, heart rate 75, respirations 18, blood pressure 145/91, oxygenation 93% on room air.  GENERAL: No acute distress.  PULMONARY: Very mild scattered wheezes, good air movement, no respiratory distress.  CARDIOVASCULAR: Regular rate and rhythm, no murmurs, rubs, or gallops. No peripheral edema. Peripheral pulses are 1+.  PSYCHIATRIC: Alert and oriented x 4 with good insight, eager to be discharged. No signs of uncontrolled depression or anxiety.   LABORATORY DATA: Sodium 142, potassium 4.5, chloride 104, bicarbonate 32, BUN 27, creatinine 1.03. LFTs normal. Cardiac enzymes normal. White blood cell count 7.7, hemoglobin 13.2, platelets 238,000, MCV 88. Blood cultures negative. UA negative for signs of infection.   HOME MEDICATIONS:  1.  Digoxin 125 mcg 1 tablet once a day.  2.  Quetiapine 25 mg 1 tablet 3 times a day.  3.  Marina GoodellPerry Plus prenatal multivitamin 1 tablet once  a day.  4.  Finasteride 5 mg 1 tablet once a day.  5.  Omeprazole 20 mg 1 capsule once a day.  6.  Levothyroxine 25 mcg 1 tablet once a day.  7.  Escitalopram 10 mg 1.5 tablets once a day.  8.  Vitamin D3, 400 international units daily.  9.  ProAir HFA CFC free 90 mcg/inhalation inhaler 1  inhalation 4 times a day as needed for shortness of breath.  10.  Simethicone 80 mg 1 capsule every 4 hours as needed for gas.  11.  Eucerin topical cream.  12.  Lisinopril 2.5 mg 1 tablet once a day.  13.  Mirtazapine 30 mg 1 tablet orally once a day.  14.  Advair Diskus 250 mcg/50 mcg 1 puff every 12 hours.  15.  Quetiapine 50 mg 1 tablet once a day at bedtime.  16.  Furosemide 40 mg 1 tablet once a day in the morning.  17.  Polyethylene glycol.  18.  Maalox.  19.  Lumigan 0.01% ophthalmic solution 1 drop to each eye once a day in the evening.  20.  Aspirin 81 mg 1 tablet once a day.  21.  Metoprolol tartrate 25 mg 0.5 tablets once a day.  22.  Albuterol/ipratropium 2.5 mg/0.5 mg/3 mL inhalation solution inhaled every 6 hours as needed for shortness of breath.  23.  Melatonin 3 mg 1 tablet once a day at bedtime.  24.  Valium 5 mg 1 tablet 4 times a day as needed.  25.  Prednisone 60 mg, decrease by 10 mg every 2 days and then stop.  26.  Levofloxacin 500 mg 1 tablet every 24 hours.   CONDITION ON DISCHARGE: Stable.   DISPOSITION: Discharged to home.   DISCHARGE INSTRUCTIONS:  DIET: Regular.   ACTIVITY: As tolerated.  TIMEFRAME FOR APPOINTMENT: Follow up within 1 to 2 weeks with your primary care physician.   TIME SPENT ON DISCHARGE: 40 minutes.   ____________________________ Ena Dawley. Clent Ridges, MD cpw:ST D: 12/21/2013 13:19:21 ET T: 12/21/2013 22:58:22 ET JOB#: [id number]  cc: Ena Dawley. Clent Ridges, MD, <Dictator> Gale Journey MD ELECTRONICALLY SIGNED 12/25/2013 9:54

## 2014-06-16 NOTE — Consult Note (Signed)
PATIENT NAME:  Darrell HerrlichDIX, Darrell M MR#:  [id number] DATE OF BIRTH:  [birth year]  DATE OF CONSULTATION:  09/04/2013  REFERRING PHYSICIAN:  Aletha HalimJeff Sparks, MD  CONSULTING PHYSICIAN:  Lindsea Olivar D. Juliann Paresallwood, MD  PRIMARY CARE PHYSICIAN:  Beverely RisenFozia Khan, MD    INDICATION: Chest pain, possible unstable angina.   HISTORY OF PRESENT ILLNESS: The patient is a 79 year old white male, resides in  Retirement.  Patient states he has a  history of coronary artery disease, previous congestive heart failure, coronary bypass surgery, recently known to have COPD with recent pneumonia, has had hyperlipidemia, anxiety, and depression. The patient was in the Emergency Room with chest pain, shortness of breath. While at rest, symptoms relieved with nitroglycerin. Also the patient is having a lot of dyspepsia, burping, and belching. In the Emergency Room, the patient's troponin was found to be normal.  EKG nonspecific. He was comfortably treated with nitroglycerin, but he states that when he drinks or eats, he starts having pain, midsternal, and it takes a while to relieve itself.   PAST MEDICAL HISTORY: Arteriosclerotic vascular disease, congestive heart failure, COPD, pneumonia, hyperlipidemia, osteoarthritis, anxiety, depression, dementia, history of paroxysmal atrial fibrillation, glaucoma, BPH, abdominal aortic aneurysm.   MEDICATIONS:  Seroquel 25 mg 3 times a day, 50 at bedtime; ProAir 2 puffs q. 4 p.r.n., multivitamin once a day, MiraLax 17 grams daily, omeprazole 20 mg a day, Remeron 30 at bedtime, Lopressor 12.5 twice a day, melatonin 2 mg at bedtime, Lumigan eyedrops as needed, lisinopril 2.5 daily, Synthroid 25 daily, Lasix 40 a day, Proscar 5 mg daily, Lexapro 50 in a day, Colace 100 mg twice a day, Lanoxin 0.125 daily, aspirin 81 mg a day, Xanax 0.25 q. 6 p.r.n., Advair 250/50 twice a day, albuterol p.r.n.   ALLERGIES: None.   SOCIAL HISTORY: Quit smoking 10 years ago. Denies alcohol consumption.   FAMILY HISTORY: Coronary  artery disease, stroke.   REVIEW OF SYSTEMS:  Denies blackout spells, syncope. No nausea, vomiting. Denies fever. Denies chills. No sweats. No weight loss. No weight gain. No hemoptysis or hematemesis. Denies bright red blood per rectum. He has had dyspeptic symptoms recently.   PHYSICAL EXAMINATION:  VITAL SIGNS: Blood pressure 100/70, heart rate of 70, respiratory rate of 16, afebrile.  HEENT: Normocephalic, atraumatic. Pupils equal and reactive to light.  NECK: Supple. No significant JVD, bruits or adenopathy.  LUNGS:  Bilateral rhonchi. No rales. No wheezing.  HEART: Distant systolic ejection murmur at the apex.  ABDOMEN: Benign.  EXTREMITIES: Within normal limits.  NEUROLOGIC: Intact.  SKIN: Normal.   RADIOLOGICAL AND LABORATORY DATA:  EKG: Sinus rhythm, nonspecific ST-T changes. Chest x-ray: Basal atelectasis otherwise negative. Glucose 96, BUN of 28, creatinine of 1.64, sodium 138, potassium 2.4. Digoxin level of 0.60.  Total CK 33.  Troponin 0.02. White count of 13.8, hemoglobin 15.   ASSESSMENT:  1. Chest pain, unclear etiology, possible angina. Known coronary artery disease. 2. Chronic obstructive pulmonary disease.  3. Renal insufficiency.  4. Hypokalemia.  5. Anxiety. 6. Gastroesophageal reflux disease symptoms.   PLAN:  1. Agree with continued therapy. Rule out for myocardial infarction. Followup cardiac enzymes. Followup EKG.  Consider echocardiogram. Nitroglycerin as necessary. Low-dose aspirin 81 mg. Continue current therapy for coronary artery disease and angina. Continue treatment for paroxysmal atrial fibrillation day with Lanoxin. Heart failure appears to be reasonably compensated at this point.  Continue Lopressor and lisinopril and digoxin for heart failure symptoms. 2. For angina, continue Lopressor therapy.  3. For hyperkalemia, continue potassium  as necessary.  4. For COPD, continue inhalers. We will consider GI workup. With his symptoms, it appeared to be  related to drinking and eating, may be esophageal in nature. Consider functional study. Do not recommend cardiac catheterization at this stage.    ____________________________ Bobbie Stack Juliann Pares, MD ddc:dd D: 09/04/2013 13:27:24 ET T: 09/04/2013 18:35:44 ET JOB#: [id number]  cc: Lovette Merta D. Juliann Pares, MD, <Dictator> Alwyn Pea MD ELECTRONICALLY SIGNED 10/06/2013 12:45

## 2014-06-16 NOTE — H&P (Signed)
PATIENT NAME:  Darrell Mcconnell, Darrell Mcconnell MR#:  [id number] DATE OF BIRTH:  08-05-1935  DATE OF ADMISSION:  12/15/2013  PRIMARY CARE PHYSICIAN: Lyndon Code, MD   CHIEF COMPLAINT: Cough, shortness of breath today.   HISTORY OF PRESENT ILLNESS: This is a 79 year old male with a history of COPD, CHF, who presented to the ED with the above chief complaint. The patient is alert, awake, but has mild dementia, in no acute distress. The patient said he has had a cough, sputum, and shortness of breath today, but he denies any fever or chills, and no leg edema. The patient was found hypoxic at 88% on 4 liters oxygen. He was put on BiPAP. After a period of BiPAP treatment, the patient's shortness of breath is better; now he is off BiPAP. The patient's chest x-ray showed possible opacity and infiltrate. He was treated with Zithromax and Rocephin in the ED.   PAST MEDICAL HISTORY: CAD, systolic congestive heart failure, pneumonia, hyperlipidemia, osteoarthritis, anxiety, depression, early dementia, transient atrial fibrillation, glaucoma, BPH, history of AAA.   SOCIAL HISTORY: Nursing home resident. No smoking or drinking or illicit drugs   PAST SURGICAL HISTORY: CABG, AAA repair, prostate surgery.   FAMILY HISTORY: CAD, CVA.   ALLERGIES: None.   HOME MEDICATIONS: Reconciliation list is not done yet. We will update later.   REVIEW OF SYSTEMS: The patient is demented. Unable to obtain review of systems at this time. He only complains of shortness of breath, but denies any other symptoms, which is not reliable.   PHYSICAL EXAMINATION:  VITAL SIGNS: Temperature 99.2, blood pressure 95/69, pulse 83, O2 saturation 95% on oxygen. GENERAL: The patient is weak, alert, in no acute distress. HEENT: Pupils round, equal, reactive to light and accommodation. Moist oral mucosa. Clear oropharynx.   NECK: Supple. No JVD or carotid bruit. There is no lymphadenopathy. No thyromegaly.  CARDIOVASCULAR: S1, S2. Regular rate and rhythm.  No murmurs or gallops.  PULMONARY: Bilateral air entry. No wheezing or rales. No use of accessory muscles to breathe.  ABDOMEN: Soft. No distention or tenderness. No organomegaly. Bowel sounds are present.  EXTREMITIES: No edema, clubbing, or cyanosis. No calf tenderness. Bilateral pedal pulses  present.  SKIN: No rash or jaundice.  NEUROLOGY: The patient is alert, awake. He knows his name and the location, but not the time. He is demented, but followed commands. No focal deficit. Power 4/5. Sensation intact.   LABORATORY DATA: Venous ABG showed pCO2 of 42. Lactic acid 2.0. CBC in normal range. Glucose 131, BUN 33, creatinine 1.53. Electrolytes normal except potassium 3.4, magnesium 1.5. INR 1.1. Troponin less than 0.02. Digoxin level was 0.66.   Chest x-ray showed patchy left lower lobe opacity.   EKG showed atrial fibrillation with RVR at 104 bpm.   IMPRESSIONS:  1.  Pneumonia.  2.  Acute respiratory failure.  3.  Chronic obstructive pulmonary disease.  4.  Acute renal failure.  5.  Hypokalemia.  6.  Hypomagnesemia.  7.  Atrial fibrillation with rapid ventricular response.  8.  Hyperlipidemia.  9.  Dementia.  10.  History of benign prostatic hypertrophy.  11. History of systolic CHF.  PLAN OF TREATMENT:  1.  The patient will be admitted to medical floor. The patient was treated with Zithromax and Rocephin. We will start Levaquin. Follow up sputum culture, CBC, and blood culture. Give nebulizer treatment and oxygen by nasal cannula. The patient may need BiPAP.  2.  For atrial fibrillation, we will continue Lopressor and give  aspirin. The patient is demented and has a high risk of fall. It appears that he is not a candidate for anticoagulation.  3.  For history of systolic CHF, at this time is stable, we will continue Lasix and lisinopril, also continue digoxin.  4. For ARF, start NS,  follow up BMP. 5.  I discussed the patient's condition and plan of treatment with the patient. The  patient has a  DNR paper.   CODE STATUS: DNR.   TIME SPENT: About 52 minutes.    ____________________________ Shaune PollackQing Meia Emley, MD qc:ts D: 12/15/2013 20:53:13 ET T: 12/15/2013 21:32:40 ET JOB#: [id number]  cc: Shaune PollackQing Thomasene Dubow, MD, <Dictator> Shaune PollackQING Naim Murtha MD ELECTRONICALLY SIGNED 12/16/2013 13:36

## 2014-06-16 NOTE — H&P (Signed)
PATIENT NAME:  Darrell Mcconnell, Darrell Mcconnell MR#:  [id number] DATE OF BIRTH:  Feb 17, 1936  DATE OF ADMISSION:  09/03/2013  REFERRING PHYSICIAN: Charlestine Night. Scotty Court, MD  FAMILY PHYSICIAN: Lyndon Code, MD  REASON FOR ADMISSION: Chest pain, worrisome for unstable angina.   HISTORY OF PRESENT ILLNESS: The patient is a 79 year old male who resides at [facility] retirement home. The patient has a significant history of coronary artery disease with previous episode of congestive heart failure. He has chronic obstructive pulmonary disease with recent pneumonia. Also has hyperlipidemia, anxiety and early depression. Presents to the Emergency Room with chest pain and shortness of breath occurring at rest. Symptoms are relieved with sublingual nitroglycerin. Also having a lot of dyspepsia with burping and belching. In the Emergency Room, the patient's troponin was normal and his EKG was nonacute. He is currently comfortable and is admitted for further evaluation.   PAST MEDICAL HISTORY: 1.  atherosclerotic cardiovascular disease, status post coronary artery bypass graft.  2.  History of systolic congestive heart failure.  3.  Chronic obstructive pulmonary disease.  4.  Recent pneumonia.  5.  Hyperlipidemia.  6.  Osteoarthritis.  7.  Anxiety/depression.  8.  Early dementia.  9.  History of transient atrial fibrillation.  10.  Glaucoma.  11.  Benign prostatic hypertrophy.  12.  History of abdominal aortic aneurysm.   MEDICATIONS: 1.  Seroquel 25 mg p.o. t.i.d. and 50 mg p.o. at bedtime.  2.  ProAir 2 puffs every 4 hours p.r.n. shortness of breath.  3.  Multivitamin 1 p.o. daily.  4.  MiraLax 17 grams p.o. daily.  5.  Omeprazole 20 mg p.o. daily.  6.  Remeron 30 mg p.o. at bedtime.  7.  Lopressor 12.5 mg p.o. b.i.d.  8.  Melatonin 3 mg p.o. at bedtime.  9.  Lumigan eyedrops as directed.  10.  Lisinopril 2.5 mg p.o. daily.  11.  Synthroid 25 mcg p.o. daily.  12.  Lasix 40 mg p.o. daily.  13.  Proscar 5 mg  p.o. daily.  14.  Lexapro 15 mg p.o. daily.  15.  Colace 100 mg p.o. b.i.d.  16.  Lanoxin 0.125 mg p.o. daily.  17.  Aspirin 81 mg p.o. daily.  18.  Xanax 0.25 mg p.o. q.6 hours p.r.n.  19.  Advair 250/50 one puff b.i.d.  20.  Albuterol SVNs q.6 hours p.r.n. shortness of breath.   ALLERGIES: No known drug allergies.   SOCIAL HISTORY: The patient quit smoking 10 years ago. Denies alcohol abuse.   FAMILY HISTORY: Positive coronary artery disease and stroke. Negative for colon or prostate cancer.   REVIEW OF SYSTEMS:    CONSTITUTIONAL: No fever or change in weight.  EYES: No blurred or double vision. Positive glaucoma.  ENT: No tinnitus or hearing loss. No nasal discharge or bleeding. No difficulty swallowing.  RESPIRATORY: The patient has dry cough and wheezing. No hemoptysis. No painful respiration.  CARDIOVASCULAR: No orthopnea or palpitations. No syncope.  GASTROINTESTINAL: Some nausea but no vomiting or diarrhea. No abdominal pain.  GENITOURINARY: No dysuria or hematuria. No incontinence.  ENDOCRINE: No polyuria or polydipsia. No heat or cold intolerance.  HEMATOLOGIC: The patient denies anemia, easy bruising or bleeding.  LYMPHATIC: No swollen glands.  MUSCULOSKELETAL: The patient denies pain in his neck, back, shoulders, knees or hips. No gout.  NEUROLOGIC: No numbness or migraines. Denies stroke or seizures.  PSYCHIATRIC: The patient denies anxiety, insomnia or depression.   PHYSICAL EXAMINATION: GENERAL: The patient is in no acute  distress.  VITAL SIGNS: Currently remarkable for a blood pressure of 95/66 with a heart rate of 70, respiratory rate of 20, temperature of 98.2, saturation 96% on 2 liters.  HEENT: Normocephalic, atraumatic. Pupils equal, round, reactive to light and accommodation. Extraocular movements are intact. Sclerae are anicteric. Conjunctivae are clear. Oropharynx clear.  NECK: Supple without JVD. No adenopathy or thyromegaly is noted.  LUNGS: Revealed  decreased breath sounds with occasional basilar rhonchi. No wheezes or rales. No dullness. Respiratory effort is normal.  CARDIAC: Regular rate and rhythm with normal S1, S2. No significant rubs, murmurs or gallops. PMI is nondisplaced. Chest wall is nontender.  ABDOMEN: Soft, nontender, with good bowel sounds. No organomegaly or masses were appreciated. No hernias or bruits were noted.  EXTREMITIES: Without clubbing, cyanosis or edema. Pulses were 2+ bilaterally.  SKIN: Warm and dry without rash or lesions.  NEUROLOGIC: Cranial nerves II through XII grossly intact. Deep tendon reflexes were symmetric. Motor and sensory exam is nonfocal.  PSYCHIATRIC: Revealed a patient who was alert and oriented to person, place and time. He was cooperative and used good judgment.   LABORATORY, DIAGNOSTIC AND RADIOLOGICAL DATA:  EKG revealed sinus rhythm with no acute ischemic changes. Chest x-ray was unremarkable except for some chronic left basilar atelectasis. His glucose was 96 with a BUN of 28, creatinine 1.64 with a GFR of 40. Sodium was 138 with a potassium of 3.4. Digoxin level is 0.60. His total CK was 33 with a troponin of less than 0.02. White count 13.8 with a hemoglobin of 15.0.   ASSESSMENT: 1.  Chest pain, worrisome for unstable angina.  2.  Chronic obstructive pulmonary disease, on oxygen.  3.  Stage III chronic kidney disease.  4.  Hypokalemia.  5.  Anxiety/depression.  6.  History of coronary artery disease, status post coronary artery bypass graft.   PLAN: The patient will be observed on telemetry with subcutaneous heparin and aspirin therapy. Will continue his outpatient regimen and use morphine and sublingual nitroglycerin as needed for pain. Will follow serial cardiac enzymes and obtain an echocardiogram. We will also consult cardiology. We will continue his pulmonary regimen and wean oxygen as tolerated. Follow up routine labs in the morning including a renal panel after IV hydration.  Further treatment and evaluation will depend upon the patient's progress.   TOTAL TIME SPENT ON THIS PATIENT: 50 minutes.    ____________________________ Duane LopeJeffrey D. Judithann SheenSparks, MD jds:cs D: 09/03/2013 16:27:53 ET T: 09/03/2013 18:10:27 ET JOB#: [id number]  cc: Duane LopeJeffrey D. Judithann SheenSparks, MD, <Dictator> Lyndon CodeFozia M. Khan, MD Clarrisa Kaylor D Layal Javid MD ELECTRONICALLY SIGNED 09/10/2013 13:08
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
## 2014-06-16 NOTE — Discharge Summary (Signed)
PATIENT NAME:  Darrell Mcconnell, Darrell Mcconnell MR#:  [id number] DATE OF BIRTH:  [birth year]  DATE OF ADMISSION:  08/25/2013 DATE OF DISCHARGE:  08/28/2013  DISCHARGE DIAGNOSES: 1.  Chronic obstructive pulmonary disease exacerbation.  2.  Chronic diastolic heart failure.  3.  Anxiety. 4.  History of suicidal attempt.  CONSULTATION: Psychiatry consult.   DISCHARGE MEDICATIONS:  1.  Digoxin 125 mcg p.o. daily. 2.  Seroquel 25 mg p.o. t.i.d.  3.  Finasteride 5 mg p.o. daily. 4.  Omeprazole 20 mg p.o. daily.  5.  Coreg 100 mg p.o. b.i.d. 6.  Lovenox 20 mcg p.o. daily. 7.  Celexa 10 mg 1-1/2 tablets daily.  8.  Melatonin 3 mg at bedtime. 9.  ProAir 1 puff 4 times daily. 10.  Simethicone 80 mg every 4 hours. 11.  Bactrim 1 tablet p.o. b.i.d. The patient has prescriptions and I would like him to finish that.  12.  Lasix 40 mg p.o. daily.  13.  Metoprolol 25 mg half tablet daily.  14.  Aspirin daily.  15.  Mirtazapine 30 mg p.o. daily.  16.  Lisinopril 2.3 mg p.o. daily.  17.  Advair Diskus 250/50 one puff b.i.d.  18.  Seroquel 50 mg a day at bedtime in addition to the previous Seroquel.  19.  Triamcinolone topical ointment as needed for rash.  20.  Maalox 30 p.m. p.o. b.i.d. for gas.  21.  Xanax 0.25 mg every 6 hours as needed for anxiety.  This dose has increased per psychiatry. 22.  Azithromycin 5 mg p.o. daily for 3 days.  23.  Prednisone 20 mg 2 tablets daily for 2 day, 1 tablet daily for 2 days and then stop.  PRIMARY DOCTOR: Dr. Beverely Risen.   HOSPITAL COURSE:  1.  COPD exacerbation:  A 79 year old male patient who came in because of chest pain and trouble sleeping.  The patient also had low blood pressure when he came. The patient found to have some dry cough and trouble breathing at home, and he had been admitted for COPD exacerbation and started on IV Solu-Medrol, IV steroids and nebulizers and his symptoms improved and discharged home with antibiotics.  The patient also had a small blister on his  right thigh. He was given Bactrim by attending that and it is best to continue that.   2.  Anxiety and suicidal ideation. Patient tried to cut his stomach with a kitchen knife, and patient was made IVC and 1 to 1 sitter > placed, and patient seen by Dr. Guss Bunde .  This patient has history of depression and anxiety, and patient had his wife's death n 07/11/2012 after 56 years of marriage.  Since then, he feels like anxious and he is right now  living in a retirement home for 3 years and patient is seen by Dr. Guss Bunde.  He said that he made a mistake and he called the nurse immediately and told her,.pt  seen by Dr. Guss Bunde.  She said the patient can increase  Xanax to 0.25 mg 4 times daily and says that Xanax helps him, so we continued 1:1 sitter for a day, then discharged him the next day.  The patient's sitter was discontinued by Dr. Guss Bunde on  June 5. Patient's symptoms nicely improved and said he was controlled and we increased Xanax 0.25 mg every 6 hours, he was getting t.i.d.  The patient, otherwise, as noted his cognition was good, and also his memory and recall were also good.  The patient did not  have any suicidal or homicidal ideation. Discharged home in stable condition. Regarding his chronic diastolic heart failure, he was not in exacerbation, so we continued his aspirin, beta blockers, statins, ACE inhibitors, and Lasix.   3.  [facility], so [facility].    LABORATORY DATA: Showed LDL 129, but his troponins were negative. The patient's blood cultures have been negative.  Chest x-ray showed no acute abnormality.  CBC and BMP were all normal except creatinine of 1.54, but nothing major. The patient was discharged home in stable condition.   TIME SPENT ON DISCHARGING THE PATIENT: More than 30 minutes.    ____________________________ Katha HammingSnehalatha Lavella Myren, MD sk:ts D: 08/29/2013 11:36:17 ET T: 08/29/2013 14:31:59  ET JOB#: [id number]  cc: Katha HammingSnehalatha Trini Christiansen, MD, <Dictator> Lyndon CodeFozia M. Khan, MD  Katha HammingSNEHALATHA Reedy Biernat MD ELECTRONICALLY SIGNED 09/07/2013 20:12

## 2014-06-17 NOTE — Discharge Summary (Signed)
PATIENT NAME:  Darrell Mcconnell, Darrell Mcconnell MR#:  [id number] DATE OF BIRTH:  Oct 19, 1935  DATE OF ADMISSION:  02/12/2011 DATE OF DISCHARGE:  02/20/2011  PRIMARY CARE PHYSICIAN: Dorothey Baseman, MD at Rehab  DISCHARGE DIAGNOSES:   1. Clinical sepsis secondary to pneumonia. 2. Hypotension.  3. Respiratory failure.  4. Atrial fibrillation.  5. Cardiomyopathy.  6. Coronary artery disease.  7. Depression.  8. Glaucoma.  9. Hyperlipidemia.  10. Benign prostatic hypertrophy. 11. B12 deficiency.    NOTE: PPD placed on the right forearm, to be read 02/21/2011, area circled.  MEDICATIONS ON DISCHARGE:  1. Combigan 0.2 to 0.5, one drop each eye every 12 hours. 2. Lumigan 0.03%, one drop each eye at bedtime.  3. Lovastatin 40 mg at bedtime.  4. Flomax 0.4 mg daily.  5. Zoloft 100 mg daily.  6. Proscar 5 mg daily.  7. Aspirin 81 mg daily.  8. Vitamin B12 1000 mcg daily.  9. Nitrostat 0.4 mg sublingual tablet every 5 minutes up to 3 doses as needed for chest pain.  10. Risperdal of 0.5 mg b.i.d.   11. Metoprolol 12.5 mg p.o. b.i.d.   12. Digoxin 0.125 mg p.o. daily.  13. Augmentin 875 mg p.o. b.i.d. for two more days, then stop.  14. Oxygen 2 liters nasal cannula.  DIET: Low sodium diet.   ACTIVITY: Activity as tolerated.   FOLLOWUP: Follow up in 1 to 2 days with doctor at rehab.   HOSPITAL LABORATORY, DIAGNOSTIC AND RADIOLOGICAL DATA:  Salicylates and acetaminophen negative.  TSH normal range.  Ethanol level negative.  Glucose 109, BUN 25, creatinine 1.03, sodium 143, potassium 4.0, chloride 101, CO2 31.  Liver function tests normal.  White blood cell count 4.7, hemoglobin and hematocrit 11.0 and 35.0, platelet count of 182. Urine toxicology positive for benzodiazepine. Urinalysis 1+ ketones.  Chest x-ray: No acute changes.  EKG: Atrial flutter, 136 beats per minute.  Repeat chest x-ray: Possible atelectasis at the right costophrenic angle.  Influenza was negative.  ABG showed a pH of 7.44,  pCO2 48, pO2 47, bicarbonate 32.6, oxygen saturation 84.3. Lipase of 61.  Urinalysis: Trace ketones.  Blood cultures were negative.  CT scan of the chest for pulmonary embolism showed no acute pulmonary embolism, bibasilar infiltrates. Tiny right pleural effusion.  Pulse oximetry on room air 85% on the 25th.  Chest x-ray on the 28th showed improvement of the appearance of the left lung with earlier study. Trace of pleural fluid on the right, a little larger on the left. White count upon discharge 5.5.   HOSPITAL COURSE: The patient was in the ER 02/10/2011 to 02/12/2011 waiting for a psychiatric bed. The ER physician noted the patient was hypoxic. A CT scan of the chest was done which showed pneumonia, and Hospitalist Services were contacted on the 20th of December for pneumonia and hypoxia. He was admitted with clinical sepsis, given IV fluids and started on Zosyn for hospital-acquired pneumonia and his hypotension related to the sepsis. For his hypoxic respiratory failure he was given oxygen supplementation. For his history of atrial fibrillation and flutter, he is on aspirin. His blood pressure was initially low and his medications needed to be held.   HOSPITAL COURSE PER PROBLEM LIST:  1. Clinical sepsis and pneumonia: The patient has improved. He was started on IV Zosyn for hospital acquired. Zithromax was added to cover atypicals. He finished the Zithromax course while here, switched over to p.o. Augmentin for 2 more days.  2. Hypotension: This improved with IV  fluid hydration.  3. Respiratory failure: The patient was on oxygen during the entire hospital course. Pulse oximetry 85% on room air a couple of days ago. Currently, pulse oximetry is 89% on room air. Hopefully the patient will be able to come off of oxygen, but we will leave this at 2 liters nasal cannula for now.  4. Atrial fibrillation/atrial flutter: He is on aspirin only for anticoagulation with his fall risk. Rate is controlled with  digoxin and low-dose metoprolol.  5. Cardiomyopathy: His Lasix is held at this point. Blood pressure became too low with a combination of Lasix and metoprolol. No signs of congestive heart failure.  6. History of coronary artery disease: Aspirin and metoprolol given. The patient does complain of chest pain quite often, especially when he is upset with the situation that he is in. Most of the time this is not secondary to heart disease or heart attack.  7. Depression: Seen in consultation by Dr. Toni Amendlapacs. He is on Risperdal and  Zoloft.  8. Glaucoma: Continue his eye drops.  9. Hyperlipidemia: Continue pravastatin.  10. Benign prostatic hypertrophy: On Flomax and Proscar.  11. B12 deficiency: Continue B12 supplementation.  12. There was a possible exposure with a suspected tuberculosis patient, not sure if that patient has tuberculosis, but a PPD was placed on the right forearm on 12/27 and will be read on 12/29. Recommend a follow-up PPD in 6 weeks.   TIME SPENT ON DISCHARGE: 40 minutes.   ____________________________ Herschell Dimesichard J. Renae GlossWieting, MD rjw:cbb D: 02/20/2011 12:14:22 ET T: 02/20/2011 12:42:59 ET JOB#: [id number]  cc: Herschell Dimesichard J. Renae GlossWieting, MD, <Dictator> Teena Iraniavid M. Terance HartBronstein, MD at Rehab Salley ScarletICHARD J Arlenne Kimbley MD ELECTRONICALLY SIGNED 03/11/2011 16:02

## 2014-06-17 NOTE — Consult Note (Signed)
PATIENT NAME:  Darrell Mcconnell, Darrell Mcconnell MR#:  [id number] DATE OF BIRTH:  13-Mar-1935  DATE OF CONSULTATION:  01/04/2011  REFERRING PHYSICIAN:  Dr. Auburn BilberryShreyang Patel of the medicine service CONSULTING PHYSICIAN:  Marin OlpJay H. Bard Haupert, MD  PRIMARY CARE PHYSICIAN: Dr. Beverely RisenFozia Khan   REASON FOR CONSULTATION: Refractory bladder spasms.   HISTORY OF PRESENT ILLNESS: The patient is a 79 year old white male who is status post 5 vessel bypass [facility] on 12[facility]16/2010. Per the patient's wife, postoperatively the patient's course was notable for urinary retention with a urinary tract infection. [facility], however, the patient's wife opted to take the patient home. At home, the patient had periods of intermittent confusion and disorientation. He also complained of burning with a Foley catheter in place. He had also complained of intermittent severe suprapubic pain since his [facility]. The patient was [facility]/2011 for mental status changes and complicated urinary tract infection as well as generalized weakness and deconditioning with mild dehydration. Urology is being consulted to assist in the management of severe bladder spasms. The patient denies any past urologic history or having seen a urologist in the past although patient's chart comments that he is a patient of Dr. Achilles Dunkope and the medical record would suggest that he had a prior surgical procedure for benign prostatic hypertrophy. Presently, the patient denies any painful urgency or lower abdominal pain or discomfort. A 14 French Foley catheter is in place and draining dark urine. It is secured to the leg with a leg strap, the catheter was not draining well, however, when released and allowed to hang off the side prompt drainage of urine was appreciated with resolution of the suprapubic discomfort.   PAST MEDICAL HISTORY: 1. Coronary artery disease status post 5 vessel bypass on 12/16/2010  at [facility].  2. Hypertension. 3. Hyperlipidemia. 4. Depression. 5. Glaucoma.  6. Urinary retention. 7. Abdominal aortic aneurysm repair.  8. Per the patient's medical record the patient supposedly had prostate surgery due to benign prostatic hypertrophy, however, the patient denies any past urologic history.   CURRENT MEDICATIONS: 1. Flomax 0.5 mg p.o. daily. 2. Finasteride 5 mg p.o. daily. 3. Combigan eye solution 1 drop in each eye b.i.d.  4. Lovastatin 40 mg p.o. at bedtime.  5. Brimonidine 0.15% ophthalmic drops to both eyes q.12 hours. 6. Azopt 1 drop to both eyes q.12 hours. 7. Klonopin 0.25 mg q.a.Mcconnell. 8. Lasix 20 mg p.o. b.i.d.  9. Potassium chloride 20 mEq p.o. b.i.d.  10. Aspirin 81 mg p.o. daily. 11. Cipro 500 mg p.o. q.12 hours. 12. Zoloft 100 mg p.o. daily.  ALLERGIES: No known drug allergies.  SOCIAL HISTORY: Patient is married. Habits: No alcohol use. History of smoking which was discontinued five years ago (1 pack per day prior).   FAMILY HISTORY: Heart disease in mother who died in her 7070s.   REVIEW OF SYSTEMS: As per the history of present illness otherwise negative except for generalized weakness.  PHYSICAL EXAMINATION: VITAL SIGNS: Temperature 98 degrees Fahrenheit, pulse 91 regular, respirations 18, blood pressure 108/68, pulse ox 93% on 3 liters nasal cannula.   GENERAL: Well developed, frail appearing gentleman in no apparent distress, appearing fatigued, cooperative and alert and oriented x3.   HEENT: Normocephalic, atraumatic. Extraocular movements are intact. Anicteric.  NECK: No masses, no bruits.  CHEST: Clear to auscultation with normal respiratory effort.   CARDIOVASCULAR: Distant heart sounds. Regular rate and rhythm without  murmurs, gallops or rubs.   ABDOMEN: Soft and flat. Tender to palpation in the suprapubic region which resolved after repositioning of the Foley catheter resulting in increased drainage.   GENITALS:  Examination reveals an uncircumcised phallus with a 30 French Foley catheter in place with bilateral descended testes that were atrophic but nontender with a 1 cm soft nodule on the medial aspect of the left testis.   EXTREMITIES: No edema, nontender, no chords.   NEUROLOGICAL: Nonfocal.   SKIN: Without lesions about the head and neck.   LYMPHATICS: No cervical or supraclavicular adenopathy.   PSYCH: Alert and oriented x3.   LABORATORY, DIAGNOSTIC AND RADIOLOGICAL DATA: WBC 6.7, hematocrit 33.6, platelets 315,000, glucose 108, BUN 13, creatinine 0.78, sodium 138, potassium 4.4, chloride 101, carbon dioxide 31, calcium 8.6. Urine culture from the 10th is growing out greater than 100,000 colonies of a yeast. Blood cultures from the 10th are negative to date.   ASSESSMENT AND RECOMMENDATIONS:  1. Refractory bladder spasms-Currently clinically resolved. 2. Urinary retention. Although the patient is alert and oriented x3 and able to respond to questions, there is a discrepancy between his history and the medical record. He currently has a 14 [place] Foley catheter in place which apparently was placed at [facility]. This catheter should remain in place and should not be removed without urology approval in advance. The small caliber of the Foley catheter would suggest that there was some difficulty in placement of the Foley catheter at [facility]. Please request further information from [facility] Agree with continuing Flomax and finasteride and if indeed the patient is an active patient of Dr. Assunta Gambles would recommend continued follow up with Dr. Achilles Dunk.  3. Left testicular nodule. Would recommend a scrotal ultrasound to rule out an intratesticular mass.  4. Urinary tract infection. The most recent urine culture from the 10th is growing out yeast, however, the patient does not appear clinically infected and so would observe closely. If clinical signs of infection develop  would consider Diflucan.    ____________________________ Marin Olp, MD jhk:cms D: 01/04/2011 15:56:40 ET T: 01/05/2011 08:03:02 ET JOB#: [id number]  cc: Marin Olp, MD, <Dictator> Marin Olp MD ELECTRONICALLY SIGNED 04/10/2011 15:53

## 2014-06-18 LAB — SURGICAL PATHOLOGY

## 2014-06-23 DIAGNOSIS — J449 Chronic obstructive pulmonary disease, unspecified: Secondary | ICD-10-CM | POA: Diagnosis not present

## 2014-06-24 NOTE — Discharge Summary (Signed)
PATIENT NAME:  Darrell Mcconnell, Darrell Mcconnell MR#:  [id number] DATE OF BIRTH:  [birth year]  DATE OF ADMISSION:  03/29/2014 DATE OF DISCHARGE:  03/30/2014    For detailed note, please take a look at the history and physical done on admission.   DIAGNOSES AT DISCHARGE: Systemic inflammatory response syndrome suspected to be secondary to pneumonia/bronchitis, chronic obstructive pulmonary disease without acute exacerbation, anxiety with depression, dementia with agitation, hypothyroidism, hypertension.   DIET: The patient is being discharged on a low-sodium, low-fat diet.   ACTIVITY: As tolerated.   FOLLOWUP CARE: Follow-up with Dr. Clayborn Bigness in the next 1-2 weeks.   DISCHARGE MEDICATIONS: Digoxin 125 mcg daily, Seroquel 25 mg t.i.d., multivitamin daily, finasteride 5 mg daily, omeprazole 20 mg a day, Synthroid 25 mcg daily, Lexapro 15 mg daily, vitamin D3 400 international units daily, albuterol inhaler 1 puff q.i.d. as needed, simethicone 80 mg q. 4 hours as needed, lisinopril 2.5 mg daily, Remeron 30 mg daily, Advair 250/50 1 puff b.i.d., Seroquel 50 mg at bedtime, Lasix 40 mg daily, Maalox 30 mL b.i.d. as needed, Lumigan 0.01% ophthalmic solution daily in the evening, aspirin 81 mg daily, Colace 100 mg b.i.d., metoprolol tartrate 25 mg 1/2 tablet daily, melatonin 3 mg at bedtime, Tylenol 650 b.i.d. as needed DuoNeb q.i.d. as needed, MiraLax daily as needed, Zofran 4 mg q. 4 hours as needed, Tylenol with hydrocodone 1 tab q. 6 hours as needed, Xanax 0.25 mg q. 8 hours, and Xanax 0.2 mg q. 6 hours as needed, Levaquin 500 mg daily x 7 days, and dextromethorphan and guaifenesin 5 mL q. 4 hours as needed for cough and congestion.   PERTINENT STUDIES DONE DURING THE HOSPITAL COURSE: Chest x-ray done on admission showing no evidence of any acute infiltrate or any acute cardiopulmonary disease. The patient's blood cultures are noted to be negative.   BRIEF HOSPITAL COURSE: This is a 79 year old male with medical problems as  mentioned above, presented to the hospital with a fever of 101, tachycardia, and shortness of breath,  1. Systemic inflammatory response syndrome. The patient met criteria given his fever, tachycardia, and leukocytosis. The source of the systemic inflammatory response syndrome was thought to be probably bronchitis/pneumonia. His urinalysis was negative. His chest x-ray, although negative for any acute pneumonia, the patient was given one dose of IV vancomycin and Zosyn in the ER and started on IV Levaquin in the hospital. After getting IV antibiotics overnight, the patient's white cell count has normalized. He has had no fever. His tachycardia has resolved. His blood and urine cultures are negative. At this point, I will be discharging him back to his assisted living with some oral Levaquin for a few days.  2. Chronic obstructive pulmonary disease with no acute exacerbation. He will continue his Advair and his nebulizers as needed. He was discharged on oral Levaquin to treat for underlying bronchitis/pneumonia.  3. Leukocytosis. This was likely secondary to his SIRS. It has now significantly improved and resolved with IV antibiotics and fluids.  4. Anxiety/depression. The patient will continue his Xanax and Lexapro.  5. Dementia with agitation. He will continue his Seroquel and Remeron. He did require some p.r.n. doses of Haldol for some sundowning agitation, although his diet has improved and resolved now.  6. Benign prostatic hypertrophy. He was maintained on his finasteride. He will resume that.  7. Hypertension. The patient was maintained on his metoprolol and lisinopril. He will continue that.  8. Hypothyroidism. The patient was maintained on his Synthroid, and he  will also resume that upon discharge.   CODE STATUS: The patient is a DNI/DNR.   DISPOSITION: He is being discharged back to his assisted living.   TIME SPENT: 40 minutes.    ____________________________ Belia Heman. Verdell Carmine,  MD vjs:mw D: 03/30/2014 15:19:10 ET T: 03/30/2014 16:00:04 ET JOB#: [id number]  cc: Belia Heman. Verdell Carmine, MD, <Dictator> Lavera Guise, MD Henreitta Leber MD ELECTRONICALLY SIGNED 04/07/2014 12:55

## 2014-06-24 NOTE — H&P (Signed)
PATIENT NAME:  Darrell Mcconnell, Darrell Mcconnell MR#:  [id number] DATE OF BIRTH:  [birth year]  DATE OF ADMISSION:  03/29/2014  PRIMARY CARE PHYSICIAN: Beverely Risen, MD   CHIEF COMPLAINT: Fever, cough, shortness of breath.   HISTORY OF PRESENT ILLNESS: This is a 79 year old male who presents from assisted-living due to fever, shortness of breath and cough. The patient himself is a very poor historian therefore most of the history is obtained from the chart and from the ER physician. The patient apparently presented to the ER with a fever of 101, was also having some shortness of breath and a cough. He was also very very anxious. He received some Ativan, got on some IV antibiotics and antipyretics. His clinical symptoms have improved. He denies any chest pain. Admits to some mild nausea and vomiting, but no abdominal pain, no diarrhea and no other associated symptoms. The patient was noted to have a leukocytosis of 17,000 and a fever. The patient is being admitted for systemic inflammatory response syndrome and suspected sepsis.   REVIEW OF SYSTEMS: CONSTITUTIONAL: Positive documented fever of 101. No weight gain or weight loss.  EYES: No blurred or double vision.  ENT: No tinnitus. No postnasal drip. No redness of the oropharynx.  RESPIRATORY: Positive cough. No wheeze. No hemoptysis. Positive COPD. CARDIOVASCULAR: No chest pain. No orthopnea. No palpitations. No syncope.  GASTROINTESTINAL: Positive nausea. No vomiting. No diarrhea. No abdominal pain. No melena or hematochezia.  GENITOURINARY: No dysuria or hematuria.  ENDOCRINE: No polyuria, nocturia, heat or cold intolerance.  HEMATOLOGIC: No anemia. No bruising. No bleeding.  INTEGUMENTARY: No rashes. No lesions.  MUSCULOSKELETAL: No arthritis. No swelling. No gout.  NEUROLOGIC: No numbness or tingling. No ataxia. No seizure-type activity.  PSYCHIATRIC: Positive anxiety and depression. No ADD.   PAST MEDICAL HISTORY: Consistent with COPD on home oxygen,  hypertension, anxiety/depression, dementia, BPH, CHF with EF of 35%, aortic aneurysm, hypothyroidism and glaucoma.   ALLERGIES: No known drug allergies.   SOCIAL HISTORY: Used to be a smoker, quit about 10 years ago. Does have a 40 pack-year smoking history. No alcohol abuse. No illicit drug abuse. Resides in assisted living.   FAMILY HISTORY: Mother and father are both deceased. Father died from complications of heart disease.   He cannot recall what his mother died from.   CURRENT MEDICATIONS: Tylenol with hydrocodone 5/325 one tab q. 6 hours as needed, Advair 250/50 one puff b.i.d., DuoNebs 4 times daily as needed, Xanax 0.25 mg q. 6 hours as needed , Xanax 0.25 mg q. 8 hours scheduled, aspirin 81 mg a day, digoxin 125 mcg daily, Colace 100 mg b.i.d., Lexapro 15 mg daily, Eucerin cream to be applied to the arms b.i.d., finasteride 5 mg daily, Lasix 40 mg daily, Synthroid 25 mcg daily, lisinopril 2.5 mg daily, Lumigan 0.01% ophthalmic solution daily in the evening, Maalox 30 mL b.i.d., melatonin 3 mg at bedtime, metoprolol tartrate 12.5 mg daily, Remeron 30 mg at bedtime, omeprazole 20 mg daily, Zofran 4 mg q. 4 hours as needed, MiraLax daily as needed, prenatal multivitamins daily, albuterol inhaler 2 puffs 4 times daily as needed, Seroquel 25 mg t.i.d., Seroquel 50 mg at bedtime, simethicone 80 mg q. 4 hours as needed, Tylenol arthritis 650 b.i.d. as needed, vitamin D3 400 international units daily.   PHYSICAL EXAMINATION: Presently is as follows:  VITAL SIGNS: Temperature 100.1, pulse 95, respirations 20, blood pressure 123/67, sats 99% on 2 liters nasal cannula.  GENERAL: He is a pleasant-appearing male, anxious, but in no  apparent distress.  HEAD, EYES, EARS, NOSE AND THROAT: Atraumatic, normocephalic. Extraocular muscles are intact. Pupils are equal and reactive to light. Sclerae anicteric. No conjunctival injection. No pharyngeal erythema.  NECK: Supple. There is no jugular venous  distention. No bruits, lymphadenopathy or thyromegaly. HEART: Tachycardic, regular. No murmurs, no rubs, no clicks.  LUNGS: He has some rhonchi and wheezing, end-expiratory, and positive use of accessory muscles. No dullness to percussion.  ABDOMEN: Soft, flat, nontender, nondistended. Has good bowel sounds. No hepatosplenomegaly appreciated.  EXTREMITIES: No evidence of any cyanosis, clubbing, or peripheral edema. Has +2 pedal and radial pulses bilaterally.  NEUROLOGICAL: The patient is alert, awake, and oriented x1. Moves all extremities spontaneously. No focal motor or sensory deficits appreciated bilaterally.  SKIN: Moist and warm with no rashes.  LYMPHATIC: There is no cervical or axillary lymphadenopathy.   DIAGNOSTIC DATA: Serum glucose 106, BUN 14, creatinine 1.2, sodium 140, potassium 3.7, chloride 102, bicarb 30. The patient's LFTs are within normal limits. Troponin less than 0.02. White cell count 17, hemoglobin 13.7, hematocrit 41.8, platelet count 217,000. INR 1. Urinalysis within normal limits.   ABG showed a pH of 7.46, pCO2 of 36 and pO2 of 57.  Chest x-ray done showing upper normal size of cardiac silhouette, post CABG changes, no acute disease.   ASSESSMENT AND PLAN: This is a 79 year old male with history of chronic obstructive pulmonary disease, on home oxygen, hypertension, history of anxiety and depression, benign prostatic hypertrophy, congestive heart failure, aortic aneurysm, hypothyroidism and glaucoma who presents to the hospital, from assisted living, due to cough, shortness of breath and fever.  1.  Systemic inflammatory response syndrome. The patient meets criteria with ever, tachycardia, and leukocytosis, although the source is unclear. His chest x-ray is negative for any acute pulmonary source. His urinalysis is negative. He does have a history of chronic obstructive pulmonary disease; therefore, I do suspect that the most likely cause is probably pulmonary in nature.  The patient has received IV vancomycin and Zosyn in the ER, but I will place the patient on just IV Levaquin for now, follow blood and urine cultures, follow fever curve, follow hemodynamics. 2.  Chronic obstructive pulmonary disease. The patient has no acute chronic obstructive pulmonary disease exacerbation. I will continue his Advair, place him on around-the-clock nebs, continue Levaquin for now.  3.  Leukocytosis, likely secondary to the systemic antiinflammatory response syndrome. I will follow white cell count after treatment with IV antibiotic therapy.  4.  Anxiety and depression. Continue Xanax and Lexapro.  5.  Dementia with agitation. Continue Seroquel and Remeron.  6.  Benign prostatic hypertrophy. Continue finasteride. 7.  Hypertension. Presently hemodynamically stable. Continue metoprolol and lisinopril.  8.  Hypothyroidism. Continue Synthroid.  9.  Glaucoma. Continue with Lumigan eye drops.   CODE STATUS: DNI/DNR.   TIME SPENT: 50 minutes.    ____________________________ Rolly PancakeVivek J. Cherlynn KaiserSainani, MD vjs:sb D: 03/29/2014 09:32:22 ET T: 03/29/2014 09:47:31 ET JOB#: [id number]  cc: Rolly PancakeVivek J. Cherlynn KaiserSainani, MD, <Dictator> Houston SirenVIVEK J Wendelyn Kiesling MD ELECTRONICALLY SIGNED 04/07/2014 12:54

## 2014-07-04 ENCOUNTER — Encounter: Payer: Self-pay | Admitting: Emergency Medicine

## 2014-07-04 ENCOUNTER — Inpatient Hospital Stay
Admission: EM | Admit: 2014-07-04 | Discharge: 2014-07-05 | DRG: 194 | Payer: Medicare Other | Attending: Internal Medicine | Admitting: Internal Medicine

## 2014-07-04 ENCOUNTER — Emergency Department: Payer: Medicare Other

## 2014-07-04 DIAGNOSIS — I10 Essential (primary) hypertension: Secondary | ICD-10-CM

## 2014-07-04 DIAGNOSIS — R079 Chest pain, unspecified: Secondary | ICD-10-CM | POA: Diagnosis not present

## 2014-07-04 DIAGNOSIS — F329 Major depressive disorder, single episode, unspecified: Secondary | ICD-10-CM | POA: Diagnosis not present

## 2014-07-04 DIAGNOSIS — I2510 Atherosclerotic heart disease of native coronary artery without angina pectoris: Secondary | ICD-10-CM | POA: Diagnosis not present

## 2014-07-04 DIAGNOSIS — I5022 Chronic systolic (congestive) heart failure: Secondary | ICD-10-CM | POA: Diagnosis not present

## 2014-07-04 DIAGNOSIS — H547 Unspecified visual loss: Secondary | ICD-10-CM | POA: Diagnosis present

## 2014-07-04 DIAGNOSIS — J9610 Chronic respiratory failure, unspecified whether with hypoxia or hypercapnia: Secondary | ICD-10-CM | POA: Diagnosis not present

## 2014-07-04 DIAGNOSIS — F0280 Dementia in other diseases classified elsewhere without behavioral disturbance: Secondary | ICD-10-CM | POA: Diagnosis not present

## 2014-07-04 DIAGNOSIS — F0391 Unspecified dementia with behavioral disturbance: Secondary | ICD-10-CM | POA: Diagnosis not present

## 2014-07-04 DIAGNOSIS — I719 Aortic aneurysm of unspecified site, without rupture: Secondary | ICD-10-CM | POA: Diagnosis present

## 2014-07-04 DIAGNOSIS — R1084 Generalized abdominal pain: Secondary | ICD-10-CM

## 2014-07-04 DIAGNOSIS — R109 Unspecified abdominal pain: Secondary | ICD-10-CM | POA: Diagnosis not present

## 2014-07-04 DIAGNOSIS — Z79899 Other long term (current) drug therapy: Secondary | ICD-10-CM

## 2014-07-04 DIAGNOSIS — E039 Hypothyroidism, unspecified: Secondary | ICD-10-CM | POA: Diagnosis not present

## 2014-07-04 DIAGNOSIS — J449 Chronic obstructive pulmonary disease, unspecified: Secondary | ICD-10-CM

## 2014-07-04 DIAGNOSIS — Z8673 Personal history of transient ischemic attack (TIA), and cerebral infarction without residual deficits: Secondary | ICD-10-CM

## 2014-07-04 DIAGNOSIS — J45909 Unspecified asthma, uncomplicated: Secondary | ICD-10-CM | POA: Diagnosis present

## 2014-07-04 DIAGNOSIS — I714 Abdominal aortic aneurysm, without rupture, unspecified: Secondary | ICD-10-CM

## 2014-07-04 DIAGNOSIS — Z87440 Personal history of urinary (tract) infections: Secondary | ICD-10-CM | POA: Diagnosis not present

## 2014-07-04 DIAGNOSIS — Z720 Tobacco use: Secondary | ICD-10-CM | POA: Diagnosis not present

## 2014-07-04 DIAGNOSIS — J189 Pneumonia, unspecified organism: Principal | ICD-10-CM | POA: Diagnosis present

## 2014-07-04 DIAGNOSIS — I509 Heart failure, unspecified: Secondary | ICD-10-CM | POA: Diagnosis present

## 2014-07-04 DIAGNOSIS — Z9981 Dependence on supplemental oxygen: Secondary | ICD-10-CM

## 2014-07-04 DIAGNOSIS — J441 Chronic obstructive pulmonary disease with (acute) exacerbation: Secondary | ICD-10-CM | POA: Diagnosis not present

## 2014-07-04 DIAGNOSIS — I4891 Unspecified atrial fibrillation: Secondary | ICD-10-CM | POA: Diagnosis present

## 2014-07-04 DIAGNOSIS — Z66 Do not resuscitate: Secondary | ICD-10-CM | POA: Diagnosis not present

## 2014-07-04 DIAGNOSIS — N400 Enlarged prostate without lower urinary tract symptoms: Secondary | ICD-10-CM | POA: Diagnosis present

## 2014-07-04 DIAGNOSIS — Z951 Presence of aortocoronary bypass graft: Secondary | ICD-10-CM | POA: Diagnosis not present

## 2014-07-04 DIAGNOSIS — Z515 Encounter for palliative care: Secondary | ICD-10-CM

## 2014-07-04 DIAGNOSIS — E785 Hyperlipidemia, unspecified: Secondary | ICD-10-CM | POA: Diagnosis not present

## 2014-07-04 DIAGNOSIS — H409 Unspecified glaucoma: Secondary | ICD-10-CM | POA: Diagnosis present

## 2014-07-04 DIAGNOSIS — F17210 Nicotine dependence, cigarettes, uncomplicated: Secondary | ICD-10-CM | POA: Diagnosis present

## 2014-07-04 DIAGNOSIS — E538 Deficiency of other specified B group vitamins: Secondary | ICD-10-CM | POA: Diagnosis present

## 2014-07-04 DIAGNOSIS — I713 Abdominal aortic aneurysm, ruptured: Secondary | ICD-10-CM | POA: Diagnosis not present

## 2014-07-04 HISTORY — DX: Chronic obstructive pulmonary disease, unspecified: J44.9

## 2014-07-04 HISTORY — DX: Unspecified asthma, uncomplicated: J45.909

## 2014-07-04 HISTORY — DX: Aortic aneurysm of unspecified site, without rupture: I71.9

## 2014-07-04 HISTORY — DX: Essential (primary) hypertension: I10

## 2014-07-04 LAB — COMPREHENSIVE METABOLIC PANEL
ALK PHOS: 76 U/L (ref 38–126)
ALT: 16 U/L — ABNORMAL LOW (ref 17–63)
AST: 29 U/L (ref 15–41)
Albumin: 3.7 g/dL (ref 3.5–5.0)
Anion gap: 11 (ref 5–15)
BUN: 15 mg/dL (ref 6–20)
CHLORIDE: 107 mmol/L (ref 101–111)
CO2: 26 mmol/L (ref 22–32)
Calcium: 9 mg/dL (ref 8.9–10.3)
Creatinine, Ser: 0.93 mg/dL (ref 0.61–1.24)
GLUCOSE: 111 mg/dL — AB (ref 65–99)
POTASSIUM: 3.8 mmol/L (ref 3.5–5.1)
Sodium: 144 mmol/L (ref 135–145)
Total Bilirubin: 0.7 mg/dL (ref 0.3–1.2)
Total Protein: 7.1 g/dL (ref 6.5–8.1)

## 2014-07-04 LAB — LACTIC ACID, PLASMA
LACTIC ACID, VENOUS: 2 mmol/L (ref 0.5–2.0)
Lactic Acid, Venous: 1.3 mmol/L (ref 0.5–2.0)

## 2014-07-04 LAB — CBC
HCT: 41.5 % (ref 40.0–52.0)
HEMOGLOBIN: 13.5 g/dL (ref 13.0–18.0)
MCH: 27.3 pg (ref 26.0–34.0)
MCHC: 32.5 g/dL (ref 32.0–36.0)
MCV: 84.1 fL (ref 80.0–100.0)
Platelets: 159 10*3/uL (ref 150–440)
RBC: 4.94 MIL/uL (ref 4.40–5.90)
RDW: 16.9 % — ABNORMAL HIGH (ref 11.5–14.5)
WBC: 12.5 10*3/uL — AB (ref 3.8–10.6)

## 2014-07-04 LAB — URINALYSIS COMPLETE WITH MICROSCOPIC (ARMC ONLY)
BACTERIA UA: NONE SEEN
Bilirubin Urine: NEGATIVE
Glucose, UA: NEGATIVE mg/dL
HGB URINE DIPSTICK: NEGATIVE
KETONES UR: NEGATIVE mg/dL
Leukocytes, UA: NEGATIVE
NITRITE: NEGATIVE
Protein, ur: NEGATIVE mg/dL
SPECIFIC GRAVITY, URINE: 1.014 (ref 1.005–1.030)
pH: 5 (ref 5.0–8.0)

## 2014-07-04 LAB — LIPASE, BLOOD: Lipase: 30 U/L (ref 22–51)

## 2014-07-04 LAB — TROPONIN I: Troponin I: <0.03 ng/mL (ref <0.031)

## 2014-07-04 MED ORDER — MORPHINE SULFATE 2 MG/ML IJ SOLN
2.0000 mg | Freq: Once | INTRAMUSCULAR | Status: AC
  Administered 2014-07-04: 2 mg via INTRAVENOUS

## 2014-07-04 MED ORDER — MIRTAZAPINE 30 MG PO TABS
30.0000 mg | ORAL_TABLET | Freq: Every day | ORAL | Status: DC
Start: 2014-07-04 — End: 2014-07-05
  Administered 2014-07-04: 30 mg via ORAL
  Filled 2014-07-04: qty 1

## 2014-07-04 MED ORDER — ALPRAZOLAM 0.25 MG PO TABS
0.2500 mg | ORAL_TABLET | Freq: Three times a day (TID) | ORAL | Status: DC | PRN
  Administered 2014-07-04: 12:00:00 0.25 mg via ORAL
  Filled 2014-07-04: qty 1

## 2014-07-04 MED ORDER — FINASTERIDE 5 MG PO TABS
5.0000 mg | ORAL_TABLET | Freq: Every day | ORAL | Status: DC
  Administered 2014-07-04 – 2014-07-05 (×2): 5 mg via ORAL
  Filled 2014-07-04 (×2): qty 1

## 2014-07-04 MED ORDER — METHYLPREDNISOLONE SODIUM SUCC 125 MG IJ SOLR
60.0000 mg | Freq: Four times a day (QID) | INTRAMUSCULAR | Status: DC
  Administered 2014-07-04 – 2014-07-05 (×5): 60 mg via INTRAVENOUS
  Filled 2014-07-04 (×4): qty 2

## 2014-07-04 MED ORDER — IOHEXOL 300 MG/ML  SOLN
100.0000 mL | Freq: Once | INTRAMUSCULAR | Status: AC | PRN
  Administered 2014-07-04: 100 mL via INTRAVENOUS

## 2014-07-04 MED ORDER — ESCITALOPRAM OXALATE 10 MG PO TABS
15.0000 mg | ORAL_TABLET | Freq: Every day | ORAL | Status: DC
  Administered 2014-07-04: 11:00:00 via ORAL
  Administered 2014-07-05: 15 mg via ORAL
  Filled 2014-07-04: qty 1
  Filled 2014-07-04: qty 2

## 2014-07-04 MED ORDER — LEVOFLOXACIN IN D5W 750 MG/150ML IV SOLN
INTRAVENOUS | Status: AC
  Administered 2014-07-04: 750 mg via INTRAVENOUS
  Filled 2014-07-04: qty 150

## 2014-07-04 MED ORDER — BUDESONIDE 0.5 MG/2ML IN SUSP
0.2500 mg | Freq: Two times a day (BID) | RESPIRATORY_TRACT | Status: DC
  Administered 2014-07-04: 0.5 mg via RESPIRATORY_TRACT
  Administered 2014-07-04: 0.25 mg via RESPIRATORY_TRACT
  Filled 2014-07-04 (×2): qty 2

## 2014-07-04 MED ORDER — DIGOXIN 125 MCG PO TABS
0.1250 mg | ORAL_TABLET | Freq: Every day | ORAL | Status: DC
  Administered 2014-07-04 – 2014-07-05 (×2): 0.125 mg via ORAL
  Filled 2014-07-04 (×2): qty 1

## 2014-07-04 MED ORDER — LISINOPRIL 5 MG PO TABS
2.5000 mg | ORAL_TABLET | Freq: Every day | ORAL | Status: DC
  Administered 2014-07-04: 11:00:00 via ORAL
  Administered 2014-07-05: 12:00:00 2.5 mg via ORAL
  Filled 2014-07-04 (×2): qty 1

## 2014-07-04 MED ORDER — ONDANSETRON HCL 4 MG/2ML IJ SOLN
INTRAMUSCULAR | Status: AC
  Administered 2014-07-04: 4 mg via INTRAVENOUS
  Filled 2014-07-04: qty 2

## 2014-07-04 MED ORDER — NYSTATIN 100000 UNIT/ML MT SUSP
5.0000 mL | OROMUCOSAL | Status: DC
  Administered 2014-07-04 – 2014-07-05 (×6): 500000 [IU] via ORAL
  Filled 2014-07-04 (×6): qty 5

## 2014-07-04 MED ORDER — IPRATROPIUM-ALBUTEROL 0.5-2.5 (3) MG/3ML IN SOLN
3.0000 mL | RESPIRATORY_TRACT | Status: DC
  Administered 2014-07-04 – 2014-07-05 (×4): 3 mL via RESPIRATORY_TRACT
  Filled 2014-07-04 (×6): qty 3

## 2014-07-04 MED ORDER — LEVOFLOXACIN IN D5W 750 MG/150ML IV SOLN
750.0000 mg | Freq: Once | INTRAVENOUS | Status: AC
  Administered 2014-07-04: 750 mg via INTRAVENOUS

## 2014-07-04 MED ORDER — IPRATROPIUM-ALBUTEROL 0.5-2.5 (3) MG/3ML IN SOLN
RESPIRATORY_TRACT | Status: AC
Start: 2014-07-04 — End: 2014-07-04
  Filled 2014-07-04: qty 3

## 2014-07-04 MED ORDER — ONDANSETRON HCL 4 MG/2ML IJ SOLN
4.0000 mg | Freq: Once | INTRAMUSCULAR | Status: AC
  Administered 2014-07-04: 4 mg via INTRAVENOUS

## 2014-07-04 MED ORDER — DEXTROSE 5 % IV SOLN
500.0000 mg | INTRAVENOUS | Status: DC
  Administered 2014-07-04: 500 mg via INTRAVENOUS
  Filled 2014-07-04 (×3): qty 500

## 2014-07-04 MED ORDER — MOMETASONE FURO-FORMOTEROL FUM 100-5 MCG/ACT IN AERO
2.0000 | INHALATION_SPRAY | Freq: Two times a day (BID) | RESPIRATORY_TRACT | Status: DC
  Administered 2014-07-04 – 2014-07-05 (×3): 2 via RESPIRATORY_TRACT
  Filled 2014-07-04 (×2): qty 8.8

## 2014-07-04 MED ORDER — IOHEXOL 240 MG/ML SOLN
25.0000 mL | Freq: Once | INTRAMUSCULAR | Status: AC | PRN
  Administered 2014-07-04: 25 mL via ORAL

## 2014-07-04 MED ORDER — IPRATROPIUM-ALBUTEROL 0.5-2.5 (3) MG/3ML IN SOLN
RESPIRATORY_TRACT | Status: AC
  Administered 2014-07-04: 3 mL via RESPIRATORY_TRACT
  Filled 2014-07-04: qty 3

## 2014-07-04 MED ORDER — IPRATROPIUM-ALBUTEROL 0.5-2.5 (3) MG/3ML IN SOLN
3.0000 mL | Freq: Once | RESPIRATORY_TRACT | Status: AC
  Administered 2014-07-04: 3 mL via RESPIRATORY_TRACT

## 2014-07-04 MED ORDER — METHYLPREDNISOLONE SODIUM SUCC 125 MG IJ SOLR
INTRAMUSCULAR | Status: AC
  Administered 2014-07-04: 60 mg via INTRAVENOUS
  Filled 2014-07-04: qty 2

## 2014-07-04 MED ORDER — LORAZEPAM 1 MG PO TABS
1.0000 mg | ORAL_TABLET | Freq: Once | ORAL | Status: AC
  Administered 2014-07-04: 1 mg via ORAL

## 2014-07-04 MED ORDER — CEFTRIAXONE SODIUM IN DEXTROSE 20 MG/ML IV SOLN
1.0000 g | INTRAVENOUS | Status: DC
  Administered 2014-07-04 – 2014-07-05 (×2): 1 g via INTRAVENOUS
  Filled 2014-07-04 (×3): qty 50

## 2014-07-04 MED ORDER — MORPHINE SULFATE 2 MG/ML IJ SOLN
INTRAMUSCULAR | Status: AC
  Administered 2014-07-04: 2 mg via INTRAVENOUS
  Filled 2014-07-04: qty 1

## 2014-07-04 MED ORDER — ACETAMINOPHEN 325 MG PO TABS
650.0000 mg | ORAL_TABLET | ORAL | Status: DC | PRN
Start: 2014-07-04 — End: 2014-07-05
  Administered 2014-07-04 – 2014-07-05 (×2): 650 mg via ORAL
  Filled 2014-07-04 (×2): qty 2

## 2014-07-04 MED ORDER — BUDESONIDE 0.5 MG/2ML IN SUSP
RESPIRATORY_TRACT | Status: AC
  Filled 2014-07-04: qty 2

## 2014-07-04 MED ORDER — DOCUSATE SODIUM 100 MG PO CAPS
100.0000 mg | ORAL_CAPSULE | Freq: Two times a day (BID) | ORAL | Status: DC
  Administered 2014-07-04 – 2014-07-05 (×3): 100 mg via ORAL
  Filled 2014-07-04 (×3): qty 1

## 2014-07-04 MED ORDER — LEVOTHYROXINE SODIUM 25 MCG PO TABS
25.0000 ug | ORAL_TABLET | Freq: Every day | ORAL | Status: DC
  Administered 2014-07-05: 09:00:00 25 ug via ORAL
  Filled 2014-07-04: qty 1

## 2014-07-04 MED ORDER — LORAZEPAM 2 MG/ML IJ SOLN
INTRAMUSCULAR | Status: AC
  Filled 2014-07-04: qty 1

## 2014-07-04 MED ORDER — LATANOPROST 0.005 % OP SOLN
1.0000 [drp] | Freq: Every day | OPHTHALMIC | Status: DC
  Administered 2014-07-04: 21:00:00 1 [drp] via OPHTHALMIC
  Filled 2014-07-04: qty 2.5

## 2014-07-04 NOTE — H&P (Signed)
[facility] [place] [facility]   PATIENT NAME: Darrell Mcconnell    MR#:  [id number]  DATE OF BIRTH:  1935/04/16  DATE OF ADMISSION:  07/04/2014  PRIMARY CARE PHYSICIAN: No primary care provider on file.   REQUESTING/REFERRING PHYSICIAN: Lucrezia EuropeAllison Webster.  CHIEF COMPLAINT:   Chief Complaint  Patient presents with  . Abdominal Pain       Chills  HISTORY OF PRESENT ILLNESS:  Pt have baseline dementia and lives in a assisted living facility. He has order of DNR. Called the facility and they gave number of his sonDeberah Castle- HCPOA- DARRYOL Barmore- [phone], I spoke to him.   Darrell Mcconnell  is a 79 y.o. male with a known history of COPD on home oxygen, hypertension, anxiety/depression, dementia, BPH, CHF with EF of 35%, aortic aneurysm, hypothyroidism and glaucoma. From assisted living facility he is sent to the emergency room because he is more confused for last 1-2 days. As per the nurse at an assisted living facility he had complained of abdominal pain and chills since yesterday.  In ER he is found to have an enlargement of his abdominal aortic aneurysm in CAT scan and had some evidence of pneumonia. I spoke to his son on the phone about these findings, as per him patient is a DO NOT RESUSCITATE and he would not like to have the surgery for aortic aneurysm. But he agreed to admit him for pneumonia.  PAST MEDICAL HISTORY:   Past Medical History  Diagnosis Date  . CHF (congestive heart failure)- EF 35%   . COPD (chronic obstructive pulmonary disease)   . Aorta aneurysm   . Asthma   . Hypertension         Dementia.  PAST SURGICAL HISTORY: History reviewed. No pertinent past surgical history.  SOCIAL HISTORY:  History  Substance Use Topics  . Smoking status: Ex- smoker- Quit- 10 yrs ago.  . Smokeless tobacco: Not on file  . Alcohol Use: No    FAMILY HISTORY: Father died from complications of heart disease.   DRUG ALLERGIES: No Known Allergies  REVIEW OF SYSTEMS:    Pt is confused and have baseline dementia, so not able to tell me anything.  MEDICATIONS AT HOME:    Prior to Admission medications   Medication Sig Start Date End Date Taking? Authorizing Provider  ALPRAZolam (XANAX) 0.25 MG tablet Take 0.25 mg by mouth every 8 (eight) hours as needed for anxiety.   Yes Historical Provider, MD  aspirin EC 81 MG tablet Take 81 mg by mouth daily.   Yes Historical Provider, MD  bimatoprost (LUMIGAN) 0.01 % SOLN Place 1 drop into both eyes daily. At 7pm   Yes Historical Provider, MD  dicyclomine (BENTYL) 20 MG tablet Take 20 mg by mouth every 8 (eight) hours as needed (diarrhea).   Yes Historical Provider, MD  digoxin (LANOXIN) 0.125 MG tablet Take 0.125 mg by mouth daily.   Yes Historical Provider, MD  docusate sodium (COLACE) 100 MG capsule Take 100 mg by mouth 2 (two) times daily.   Yes Historical Provider, MD  escitalopram (LEXAPRO) 10 MG tablet Take 15 mg by mouth daily.   Yes Historical Provider, MD  finasteride (PROSCAR) 5 MG tablet Take 5 mg by mouth daily.   Yes Historical Provider, MD  Fluticasone-Salmeterol (ADVAIR) 250-50 MCG/DOSE AEPB Inhale 1 puff into the lungs every 12 (twelve) hours.   Yes Historical Provider, MD  furosemide (LASIX) 40 MG tablet Take 40 mg by mouth  daily.   Yes Historical Provider, MD  hydrocortisone cream 1 % Apply 1 application topically as directed. Apply as needed to rash   Yes Historical Provider, MD  ipratropium-albuterol (DUONEB) 0.5-2.5 (3) MG/3ML SOLN Take 3 mLs by nebulization 3 (three) times daily as needed (shortness of breath/wheezing).   Yes Historical Provider, MD  levothyroxine (SYNTHROID, LEVOTHROID) 25 MCG tablet Take 25 mcg by mouth daily before breakfast.   Yes Historical Provider, MD  lisinopril (PRINIVIL,ZESTRIL) 2.5 MG tablet Take 2.5 mg by mouth daily.   Yes Historical Provider, MD  Melatonin 3 MG TABS Take 1 tablet by mouth at bedtime.   Yes Historical Provider, MD  meloxicam (MOBIC) 7.5 MG tablet Take  7.5 mg by mouth daily.   Yes Historical Provider, MD  metoprolol tartrate (LOPRESSOR) 25 MG tablet Take 12.5 mg by mouth 2 (two) times daily.   Yes Historical Provider, MD  mirtazapine (REMERON) 30 MG tablet Take 30 mg by mouth at bedtime.   Yes Historical Provider, MD  nystatin (MYCOSTATIN) 100000 UNIT/ML suspension Take 5 mLs by mouth every 4 (four) hours while awake. Swish and spit every 4-6 hours   Yes Historical Provider, MD  omeprazole (PRILOSEC) 20 MG capsule Take 20 mg by mouth daily.   Yes Historical Provider, MD  OVER THE COUNTER MEDICATION Take 1 tablet by mouth daily. PrePlus CA-FE 27 mg- FA 1   Yes Historical Provider, MD  polyethylene glycol (MIRALAX / GLYCOLAX) packet Take 17 g by mouth daily as needed for moderate constipation.   Yes Historical Provider, MD  QUEtiapine (SEROQUEL) 25 MG tablet Take 25 mg by mouth 3 (three) times daily.   Yes Historical Provider, MD  QUEtiapine (SEROQUEL) 50 MG tablet Take 50 mg by mouth at bedtime.   Yes Historical Provider, MD  triamcinolone cream (KENALOG) 0.1 % Apply 1 application topically 2 (two) times daily as needed (rash).   Yes Historical Provider, MD  Vitamin D, Cholecalciferol, 400 UNITS CAPS Take 1 tablet by mouth daily.   Yes Historical Provider, MD    PHYSICAL EXAMINATION:   VITAL SIGNS: Blood pressure 116/84, pulse 98, temperature 100 F (37.8 C), temperature source Oral, resp. rate 20, height 5\' 9"  (1.753 m), weight 73.029 kg (161 lb), SpO2 94 %.  GENERAL:  79 y.o.-year-old patient lying in the bed with acute respi distress. EYES: Pupils equal, round, reactive to light and accommodation. No scleral icterus. Extraocular muscles intact.  HEENT: Head atraumatic, normocephalic. Oropharynx and nasopharynx clear. Dry mucosa,. NECK:  Supple, no jugular venous distention. No thyroid enlargement, no tenderness.  LUNGS: Normal breath sounds bilaterally,mildwheezing, some crepitation. + use of accessory muscles of respiration.   CARDIOVASCULAR: S1, S2 normal. No murmurs, rubs, or gallops.  ABDOMEN: Soft, nontender, nondistended. Bowel sounds present. No organomegaly or mass.  EXTREMITIES: No pedal edema, cyanosis, or clubbing.  NEUROLOGIC: Cranial nerves II through XII are intact. Muscle strength 4/5 all extremities. Sensation intact. Gait not checked. Not following commands due to confusion. PSYCHIATRIC: The patient is alert but not oriented. SKIN: No obvious rash, lesion, or ulcer.   LABORATORY PANEL:   CBC  Recent Labs Lab 07/04/14 0456  WBC 12.5*  HGB 13.5  HCT 41.5  PLT 159  MCV 84.1  MCH 27.3  MCHC 32.5  RDW 16.9*   ------------------------------------------------------------------------------------------------------------------  Chemistries   Recent Labs Lab 07/04/14 0456  NA 144  K 3.8  CL 107  CO2 26  GLUCOSE 111*  BUN 15  CREATININE 0.93  CALCIUM 9.0  AST  29  ALT 16*  ALKPHOS 76  BILITOT 0.7   ------------------------------------------------------------------------------------------------------------------ estimated creatinine clearance is 67.6 mL/min (by C-G formula based on Cr of 0.93). ------------------------------------------------------------------------------------------------------------------ No results for input(s): TSH, T4TOTAL, T3FREE, THYROIDAB in the last 72 hours.  Invalid input(s): FREET3   Cardiac Enzymes  Recent Labs Lab 07/04/14 0456  TROPONINI <0.03   ---------------------------------------------------------------------------------------------------------------  Urinalysis    Component Value Date/Time   COLORURINE YELLOW* 07/04/2014 0623   APPEARANCEUR CLEAR* 07/04/2014 0623   LABSPEC 1.014 07/04/2014 0623   PHURINE 5.0 07/04/2014 0623   GLUCOSEU NEGATIVE 07/04/2014 0623   HGBUR NEGATIVE 07/04/2014 0623   BILIRUBINUR NEGATIVE 07/04/2014 0623   KETONESUR NEGATIVE 07/04/2014 0623   PROTEINUR NEGATIVE 07/04/2014 0623   NITRITE NEGATIVE  07/04/2014 0623   LEUKOCYTESUR NEGATIVE 07/04/2014 0623     RADIOLOGY: Dg Chest 2 View  07/04/2014   CLINICAL DATA:  Pain  EXAM: CHEST  2 VIEW  COMPARISON:  04/20/2014  FINDINGS: Cardiac shadow is enlarged. Postsurgical changes are again noted. No focal infiltrate or sizable effusion is seen. Elevation of the left hemidiaphragm is again noted with minimal compressive atelectasis. The upper abdomen shows the stomach to be well distended.  IMPRESSION: No acute abnormality noted.   Electronically Signed   By: Alcide Clever M.D.   On: 07/04/2014 08:21   Ct Abdomen Pelvis W Contrast  07/04/2014   CLINICAL DATA:  Abdominal pain beginning today. History of aortic aneurysm repair, COPD, CHF.  EXAM: CT ABDOMEN AND PELVIS WITH CONTRAST  TECHNIQUE: Multidetector CT imaging of the abdomen and pelvis was performed using the standard protocol following bolus administration of intravenous contrast.  CONTRAST:  OMNIPAQUE IOHEXOL 300 MG/ML  SOLN  COMPARISON:  CT of the abdomen and pelvis February 27, 2013  FINDINGS: LUNG BASES: Patchy consolidation/ tree-in-bud infiltrates in the lung bases bilaterally. Status post median sternotomy. Heart is mildly enlarged, no pericardial fluid collections.  SOLID ORGANS: Enhancing 16 mm lesion in RIGHT lobe of the liver with early washout likely represents a hemangioma, unchanged. The liver is otherwise unremarkable. Multiple tiny layering gallstones versus gallbladder sludge without CT findings of acute cholecystitis. Predominately atrophic pancreas. Adrenal glands are unremarkable.  GASTROINTESTINAL TRACT: The stomach, small and large bowel are normal in course and caliber without inflammatory changes. 5 mm appendicolith, appendix is otherwise unremarkable. Moderate colonic diverticulosis.  KIDNEYS/ URINARY TRACT: Kidneys are orthotopic, worsening RIGHT renal atrophy. Delayed RIGHT nephrogram consistent with dysfunction. Too small to characterize hypodensities in the kidneys  bilaterally. Decompressed RIGHT ureter, improved. Urinary bladder is partially distended and unremarkable.  PERITONEUM/RETROPERITONEUM: Enlarging infrarenal saccular aortic aneurysm is 6 .4 x 7.3 cm, previously 5.8 x 7 cm. Linear densities within the aorta are relatively unchanged between arterial and delayed phase. Occluded RIGHT internal at artery. No lymphadenopathy by CT size criteria. Prostate is mildly enlarged. Trace amount of ascites in the pelvis.  SOFT TISSUE/OSSEOUS STRUCTURES: Non-suspicious. Small RIGHT, moderate LEFT fat containing inguinal hernia. Small fat containing ventral hernia.  IMPRESSION: Enlarging saccular infrarenal aortic aneurysm, with central linear densities favoring septations without CT findings of active extravasation though not tailored for evaluation. Consider vascular consultation.  Progressively atrophic RIGHT kidney without obstructive uropathy.  Trace amount of ascites in the pelvis.  Increasing consolidation within the lung bases concerning for pneumonia.  Acute findings discussed with and reconfirmed by Dr.ALLISON WEBSTER on 07/04/2014 at 6:59 am.   Electronically Signed   By: Awilda Metro   On: 07/04/2014 07:01    IMPRESSION AND PLAN:  *  Pneumonia  We will give Rocephin and azithromycin IV and follow blood and sputum cultures.   Currently appears hemodynamically stable.  * COPD exacerbation   Using accessory muscles of respiration and requiring supplemental oxygen.   give IV steroid,nebulizers and antibiotics .  * Abdominal aortic aneurysm  There is some enlargement in his aneurysm. I discussed about this finding with his son who is healthcare power of attorney. He had repeat the patient's decision of not going for any surgery. We will avoid chemical anticoagulation, and call palliative care consult.  * CHF chronic systolic  No active exacerbation, continue monitoring.  * Hypothyroidism  Continue levothyroxine  * Hypertension  As patient appears  somewhat dehydrated and has pneumonia, I will hold metoprolol but continue with lisinopril.     All the records are reviewed and case discussed with ED provider. Management plans discussed with the patient, family and they are in agreement.  CODE STATUS: DNR   TOTAL TIME TAKING CARE OF THIS PATIENT: 50 min.  Altamese Dilling M.D on 07/04/2014   Between 7am to 6pm - Pager - [phone]  After 6pm go to [email or web address] - password [email or web address] [facility]  [facility]  Office  [phone]  CC: Primary care physician; No primary care provider on file.

## 2014-07-04 NOTE — Evaluation (Signed)
Physical Therapy Evaluation Patient Details Name: Darrell Mcconnell MRN: [id number] DOB: [birth year] Today's Date: 07/04/2014   History of Present Illness  79 yo male with onset of PNA, SOB another admission for chronic health issues, has potential hospice consultation.  PMHx:  COPD on home oxygen, hypertension, anxiety/depression, dementia, BPH, CHF with EF of 35%, aortic aneurysm, hypothyroidism and glaucoma  Clinical Impression  Pt was seen for recovery of his functional status after being in ALF and having an episode of abd pain, chills, PNA.  Pt will need to be sent to SNF as he is unsafe on walker, cannot manage his walker and O2 safely, cannot tolerate distances to walk and will be unsupervised with ALF staff for much of his mobility.  PT to focus on strengthening and balance on various assistive devices to help with deciding his level pre-discharge    Follow Up Recommendations SNF    Equipment Recommendations  None recommended by PT    Recommendations for Other Services       Precautions / Restrictions Precautions Precautions: Fall Precaution Comments: Limited vision Restrictions Weight Bearing Restrictions: No      Mobility  Bed Mobility Overal bed mobility: Needs Assistance Bed Mobility: Supine to Sit;Sit to Supine     Supine to sit: Min assist Sit to supine: Min assist   General bed mobility comments: Uses HOB up and bedrails to assist the effort  Transfers Overall transfer level: Needs assistance Equipment used: Rolling walker (2 wheeled);1 person hand held assist Transfers: Sit to/from [employer]GI CorporationStand;Stand Pivot Transfers Sit to Stand: Min assist Stand pivot transfers: Min assist       General transfer comment: remidners for hand placement and for safety with transitions  Ambulation/Gait Ambulation/Gait assistance: Min assist Ambulation Distance (Feet): 30 Feet Assistive device: Rolling walker (2 wheeled);1 person hand held assist Gait Pattern/deviations: Step-through  pattern;Wide base of support;Drifts right/left;Decreased dorsiflexion - right;Decreased dorsiflexion - left Gait velocity: reduced Gait velocity interpretation: Below normal speed for age/gender General Gait Details: Slow progression with gait usign O2 and RW with pt having SOB and fatigue after making the effort.  He is ALF resident but will need SNF care to succeed at home.  Stairs            Wheelchair Mobility    Modified Rankin (Stroke Patients Only)       Balance Overall balance assessment: Needs assistance Sitting-balance support: Feet supported Sitting balance-Leahy Scale: Fair   Postural control: Posterior lean Standing balance support: Bilateral upper extremity supported Standing balance-Leahy Scale: Poor                               Pertinent Vitals/Pain Pain Assessment: No/denies pain    Home Living Family/patient expects to be discharged to:: Assisted living               Home Equipment: Walker - 2 wheels;Shower seat;Bedside commode;Cane - single point      Prior Function Level of Independence: Needs assistance   Gait / Transfers Assistance Needed: Superivison due to low vision on walking devices  ADL's / Homemaking Assistance Needed: ALF so does not have housework        Hand Dominance        Extremity/Trunk Assessment   Upper Extremity Assessment: Generalized weakness           Lower Extremity Assessment: Generalized weakness      Cervical / Trunk Assessment: Normal  Communication  Communication: No difficulties  Cognition Arousal/Alertness: Awake/alert Behavior During Therapy: WFL for tasks assessed/performed Overall Cognitive Status: Within Functional Limits for tasks assessed                      General Comments General comments (skin integrity, edema, etc.): Pt has been in hosp for many admissions over the last several months.  His plan is to go back to ALF but will send to SNF for recovery to  PLOF first    Exercises        Assessment/Plan    PT Assessment Patient needs continued PT services  PT Diagnosis Generalized weakness;Difficulty walking   PT Problem List Decreased strength;Decreased range of motion;Decreased activity tolerance;Decreased balance;Decreased mobility;Decreased coordination;Decreased cognition;Decreased knowledge of use of DME;Decreased safety awareness  PT Treatment Interventions DME instruction;Gait training;Functional mobility training;Therapeutic activities;Therapeutic exercise;Balance training;Neuromuscular re-education;Patient/family education   PT Goals (Current goals can be found in the Care Plan section) Acute Rehab PT Goals Patient Stated Goal: to walk again PT Goal Formulation: With patient Time For Goal Achievement: 07/18/14 Potential to Achieve Goals: Good    Frequency Min 2X/week   Barriers to discharge Other (comment);Decreased caregiver support (Higher fall risk [facility]      Co-evaluation               End of Session Equipment Utilized During Treatment: Gait belt;Oxygen Activity Tolerance: Patient tolerated treatment well;Patient limited by fatigue;Other (comment) (SOB) Patient left: in bed;with call bell/phone within reach;with bed alarm set Nurse Communication: Mobility status         Time: 1430-1450 PT Time Calculation (min) (ACUTE ONLY): 20 min   Charges:   PT Evaluation $Initial PT Evaluation Tier I: 1 Procedure     PT G CodesIvar Mcconnell:        Darrell Mcconnell 07/04/2014, 5:57 PM   Darrell Mcconnell, PT MS Acute Rehab Dept. Number: [facility] [id number] and [facility] [phone]

## 2014-07-04 NOTE — Consult Note (Signed)
Palliative Medicine Inpatient Consult Note   Name: Darrell StandardClyde Chiasson Date: 07/04/2014 MRN: [id number]  DOB: [birth year]  Referring Physician: Altamese DillingVaibhavkumar Vachhani, MD  Palliative Care consult requested for this 79 y.o. male for goals of medical therapy in patient with dementia, AAA, CAD, COPD, MDD, admitted with abd pain   [name] is a 79 yo man with PMH of dementia (probably Alzheimer's) with behavioral disturbance (psych consult 02/12/2011), AAA, AAA repair 2001, CAD s/p CABG (11/2010), CM with EF 35-40% (ECHO 09/04/13), mod-severe MR/TR, COPD, h/o tobacco use, HTN, hyperlipidemia, recurrent UTI's, h/o SP cath, R.hydronephrosis/hydroureter, [facility], PUD, vision loss due to glaucoma, CVA, a.fib, B-12 deficiency. He was admitted 07/04/14 with abd pain. CT shows enlarging AAA (7.3 x 6.4 cm) but no evidence of rupture. CT also shows probable BLL pneumonia. At present, pt is lying in bed. Answers some questions appropriately. Family not present.    REVIEW OF SYSTEMS:  Pain: None Dyspnea:  No Nausea/Vomiting:  No Diarrhea:  No Constipation:   Yes Depression:   No Anxiety:   No Fatigue:   Yes   SOCIAL HISTORY: Pt is a resident of ALF. He is widowed from his wife of 55 yrs. Wife died 01/2013. He has a son who is his Education officer, environmentaldecision-maker. 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  reports that he has been smoking.  He does not have any smokeless tobacco history on file. He reports that he does not drink alcohol.  CODE STATUS: DNR  PAST MEDICAL HISTORY: Past Medical History  Diagnosis Date  . CHF (congestive heart failure)   . COPD (chronic obstructive pulmonary disease)   . Aorta aneurysm   . Asthma   . Hypertension     PAST SURGICAL HISTORY: History reviewed. No pertinent past surgical history.  ALLERGIES:  has No Known Allergies.  MEDICATIONS:  Current Facility-Administered Medications  Medication Dose Route Frequency Provider Last Rate Last Dose  .  ALPRAZolam (XANAX) tablet 0.25 mg  0.25 mg Oral Q8H PRN Altamese DillingVaibhavkumar Vachhani, MD      . azithromycin (ZITHROMAX) 500 mg in dextrose 5 % 250 mL IVPB  500 mg Intravenous Q24H Altamese DillingVaibhavkumar Vachhani, MD      . budesonide (PULMICORT) nebulizer solution 0.25 mg  0.25 mg Nebulization BID Altamese DillingVaibhavkumar Vachhani, MD   0.25 mg at 07/04/14 0916  . cefTRIAXone (ROCEPHIN) 1 g in dextrose 5 % 50 mL IVPB - Premix  1 g Intravenous Q24H Altamese DillingVaibhavkumar Vachhani, MD      . digoxin (LANOXIN) tablet 0.125 mg  0.125 mg Oral Daily Altamese DillingVaibhavkumar Vachhani, MD      . docusate sodium (COLACE) capsule 100 mg  100 mg Oral BID Altamese DillingVaibhavkumar Vachhani, MD      . escitalopram (LEXAPRO) tablet 15 mg  15 mg Oral Daily Altamese DillingVaibhavkumar Vachhani, MD      . finasteride (PROSCAR) tablet 5 mg  5 mg Oral Daily Altamese DillingVaibhavkumar Vachhani, MD      . ipratropium-albuterol (DUONEB) 0.5-2.5 (3) MG/3ML nebulizer solution 3 mL  3 mL Nebulization Q4H Altamese DillingVaibhavkumar Vachhani, MD   3 mL at 07/04/14 0917  . latanoprost (XALATAN) 0.005 % ophthalmic solution 1 drop  1 drop Both Eyes QHS Altamese DillingVaibhavkumar Vachhani, MD      . Melene Muller[START ON 07/05/2014] levothyroxine (SYNTHROID, LEVOTHROID) tablet 25 mcg  25 mcg Oral QAC breakfast Altamese DillingVaibhavkumar Vachhani, MD      . lisinopril (PRINIVIL,ZESTRIL) tablet 2.5 mg  2.5 mg Oral Daily Altamese DillingVaibhavkumar Vachhani, MD      .  methylPREDNISolone sodium succinate (SOLU-MEDROL) 125 mg/2 mL injection 60 mg  60 mg Intravenous Q6H Altamese DillingVaibhavkumar Vachhani, MD   60 mg at 07/04/14 0919  . mirtazapine (REMERON) tablet 30 mg  30 mg Oral QHS Altamese DillingVaibhavkumar Vachhani, MD      . mometasone-formoterol (DULERA) 100-5 MCG/ACT inhaler 2 puff  2 puff Inhalation BID Altamese DillingVaibhavkumar Vachhani, MD      . nystatin (MYCOSTATIN) 100000 UNIT/ML suspension 500,000 Units  5 mL Oral Q4H while awake Altamese DillingVaibhavkumar Vachhani, MD        Vital Signs: BP 111/69 mmHg  Pulse 97  Temp(Src) 98.8 F (37.1 C) (Oral)  Resp 20  Ht 5\' 9"  (1.753 m)  Wt 72.349 kg (159 lb 8 oz)  BMI 23.54 kg/m2   SpO2 94% Filed Weights   07/04/14 0528 07/04/14 0958  Weight: 73.029 kg (161 lb) 72.349 kg (159 lb 8 oz)    Estimated body mass index is 23.54 kg/(m^2) as calculated from the following:   Height as of this encounter: 5\' 9"  (1.753 m).   Weight as of this encounter: 72.349 kg (159 lb 8 oz).   PHYSICAL EXAM:  Generall: chronically ill-appearing HEENT: OP clear, poor dentition, decreased vision and hearing Neck: Trachea midline, no LAN Cardiovascular: regular rate and rhythm Pulmonary/Chest: fair air movement ant fields, no audible wheeze Abdominal: Soft, NTTP, + bowel sounds GU: No SP tenderness Extremities: No edema  Neurological: moves extremities, follows simple commands Skin: no rashes Psychiatric: oriented to person, not time, city or name of ALF  LABS: CBC:  Recent Labs Lab 07/04/14 0456  WBC 12.5*  HGB 13.5  HCT 41.5  PLT 159   Comprehensive Metabolic Panel:  Recent Labs Lab 07/04/14 0456  NA 144  K 3.8  CL 107  CO2 26  GLUCOSE 111*  BUN 15  CREATININE 0.93  CALCIUM 9.0  AST 29  ALT 16*  ALKPHOS 76  BILITOT 0.7    IMPRESSION:  [name] is a 79 yo man with PMH of dementia (probably Alzheimer's) with behavioral disturbance (psych consult 02/12/2011), AAA, AAA repair 2001, CAD s/p CABG (11/2010), CM with EF 35-40% (ECHO 09/04/13), mod-severe MR/TR, COPD, h/o tobacco use, HTN, hyperlipidemia, recurrent UTI's, h/o SP cath, R.hydronephrosis/hydroureter, MDD with BH admission for suicide ideation, PUD, vision loss due to glaucoma, CVA, a.fib, B-12 deficiency. He was admitted 07/04/14 with abd pain. CT shows enlarging AAA (7.3 x 6.4 cm) but no evidence of rupture. CT also shows probable BLL pneumonia.   Pt known to me from previous hospitalization. He was to be followed by hospice at one time but I am not sure that he was. In light of his significant medical problems, it seems appropriate to involve hospice when pt returns to ALF. I called son ([phone]) but was  unable to reach him. Will continue to try to contact him. RN to page me if he arrives.     PLAN: Speak with son about hospice referral   More than 50% of the visit was spent in counseling/coordination of care: YES  Time spent: 70 minutes

## 2014-07-04 NOTE — Plan of Care (Signed)
Problem: Consults Goal: Nutrition Consult-if indicated Outcome: Progressing Patient [facility] with complaints of abdominal pain and shortness of breath.  Patient with a history of dementia, very hard of hearing and legally blind.  Patient with dx with pneumonia on 2 liters of oxygen not usually at home.  Using urinal with one assist, high risk for falls, bed alarm on.  DNR in place

## 2014-07-04 NOTE — Consult Note (Signed)
I spoke with pt's son by phone. Updated him on pt's current condition. Suggested the involvement of hospice when pt returns to ALF. Son is in agreement. Hospice screen placed.

## 2014-07-04 NOTE — Progress Notes (Signed)
ANTIBIOTIC CONSULT NOTE - INITIAL  [facility] Ind[facility]: pneumonia  No Known Allergies  Patient Measurements: Height: 5\' 9"  (175.3 cm) Weight: 159 lb 8 oz (72.349 kg) IBW/kg (Calculated) : 70.7    Vital Signs: Temp: 97.7 F (36.5 C) (05/11 1436) Temp Source: Oral (05/11 1436) BP: 117/76 mmHg (05/11 1436) Pulse Rate: 84 (05/11 1436) Intake/Output from previous day:   Intake/Output from this shift:    Labs:  Recent Labs  07/04/14 0456  WBC 12.5*  HGB 13.5  PLT 159  CREATININE 0.93   Estimated Creatinine Clearance: 67.6 mL/min (by C-G formula based on Cr of 0.93). No results for input(s): VANCOTROUGH, VANCOPEAK, VANCORANDOM, GENTTROUGH, GENTPEAK, GENTRANDOM, TOBRATROUGH, TOBRAPEAK, TOBRARND, AMIKACINPEAK, AMIKACINTROU, AMIKACIN in the last 72 hours.   Microbiology: Recent Results (from the past 720 hour(s))  Culture, blood (routine x 2)     Status: None (Preliminary result)   Collection Time: 07/04/14  8:37 AM  Result Value Ref Range Status   Specimen Description BLOOD  Final   Special Requests Normal  Final   Culture NO GROWTH < 12 HOURS  Final   Report Status PENDING  Incomplete  Culture, blood (routine x 2)     Status: None (Preliminary result)   Collection Time: 07/04/14  8:38 AM  Result Value Ref Range Status   Specimen Description BLOOD  Final   Special Requests Normal  Final   Culture NO GROWTH < 12 HOURS  Final   Report Status PENDING  Incomplete    Medical History: Past Medical History  Diagnosis Date   CHF (congestive heart failure)    COPD (chronic obstructive pulmonary disease)    Aorta aneurysm    Asthma    Hypertension     Medications:  Anti-infectives    Start     Dose/Rate Route Frequency Ordered Stop   07/04/14 1130  azithromycin (ZITHROMAX) 500 mg in dextrose 5 % 250 mL IVPB     500 mg 250 mL/hr over 60 Minutes Intravenous Every 24 hours 07/04/14 1003 07/11/14 1129   07/04/14 1015  cefTRIAXone (ROCEPHIN) 1  g in dextrose 5 % 50 mL IVPB - Premix     1 g 100 mL/hr over 30 Minutes Intravenous Every 24 hours 07/04/14 1003 07/11/14 1014   07/04/14 0730  levofloxacin (LEVAQUIN) IVPB 750 mg     750 mg 100 mL/hr over 90 Minutes Intravenous  Once 07/04/14 0725 07/04/14 1019     Assessment: 78 YO facility resident with evidence of pneumonia on CAT scan. Doses ordered are appropriate for current renal function.  Goal of Therapy:  Resolution of pneumonia.   Plan:  Expected duration 7 days with resolution of temperature and/or normalization of WBC Continue current therapy.  Ananias Pilgrimndrew S Norman 07/04/2014,4:46 PM

## 2014-07-04 NOTE — ED Notes (Signed)
Patient up walking around. Assisted back to stretcher in the hallway. Patient continues to ask for water or something to drink. Does not accept no for an answer. Attempts at explaining a possible surgical encounter are not understood. Patient assisted with CT contrast. Patient is short of breath from walking around. Placed back on oxygen at 2L/min. MD notified, no new orders received.

## 2014-07-04 NOTE — ED Provider Notes (Signed)
[facility] Emergency Department Provider Note  ____________________________________________  Time seen: Approximately 448 AM  I have reviewed the triage vital signs and the nursing notes.   HISTORY  Chief Complaint Abdominal Pain  Patient with a history of dementia  HPI Darrell Mcconnell is a 79 y.o. male comes in tonight with abdominal pain. The patient reports that he did have some pain yesterday that went away but returned tonight at approximately 2 AM. The patient reports that the pain is all over his stomach. He denies any vomiting but reports that he did feel nauseous and had some dry heaving. The patient reports that he has a history of a AAA and he is unsure if that may be causing his pain again. The patient reports that he wanted some water and thinks that that may improve his pain. The patient reports that he did have a fever to 101 yesterday and a temperature of 100 here in the emergency department. The patient reports that he did have some constipation last week but no sick contacts. The patient reports that he does have some dizziness no chest pain or shortness of breath. The patient is here for evaluation.   Past Medical History  Diagnosis Date  . CHF (congestive heart failure)   . COPD (chronic obstructive pulmonary disease)   . Aorta aneurysm   . Asthma   . Hypertension     There are no active problems to display for this patient.   History reviewed. No pertinent past surgical history.  No current outpatient prescriptions on file.  Allergies Review of patient's allergies indicates no known allergies.  History reviewed. No pertinent family history.  Social History History  Substance Use Topics  . Smoking status: Smoker, Current Status Unknown  . Smokeless tobacco: Not on file  . Alcohol Use: No    Review of Systems Constitutional: Fever Eyes: No visual changes. ENT: No sore throat. Cardiovascular: Denies chest pain. Respiratory:  Denies shortness of breath. Gastrointestinal: abdominal pain and nausea, constipation. Genitourinary: Negative for dysuria. Musculoskeletal: Negative for back pain. Skin: Negative for rash. Neurological: headache, 10-point ROS otherwise negative.  ____________________________________________   PHYSICAL EXAM:  VITAL SIGNS: ED Triage Vitals  Enc Vitals Group     BP 07/04/14 0501 118/78 mmHg     Pulse Rate 07/04/14 0501 100     Resp 07/04/14 0501 18     Temp 07/04/14 0501 100 F (37.8 C)     Temp Source 07/04/14 0501 Oral     SpO2 07/04/14 0501 96 %     Weight --      Height --      Head Cir --      Peak Flow --      Pain Score 07/04/14 0501 10     Pain Loc --      Pain Edu? --      Excl. in GC? --     Constitutional: Alert and oriented. In moderate distress. Eyes: Conjunctivae are normal. PERRL. EOMI. Head: Atraumatic. Nose: No congestion/rhinnorhea. Mouth/Throat: Mucous membranes are dry.  Oropharynx non-erythematous. Cardiovascular: Normal rate, regular rhythm. Grossly normal heart sounds.  Good peripheral circulation. Respiratory: Increased work of breathing with mild wheezes throughout lung fields  Gastrointestinal: Soft, patient denies pain No distention.  Genitourinary: Deferred Musculoskeletal: No lower extremity tenderness nor edema.  No joint effusions. Neurologic:  Normal speech and language. No gross focal neurologic deficits are appreciated. Speech is normal.  Skin:  Skin is warm, dry and intact. No  rash noted. Psychiatric: Patient continually asking about water.  ____________________________________________   LABS (all labs ordered are listed, but only abnormal results are displayed)  Labs Reviewed  COMPREHENSIVE METABOLIC PANEL - Abnormal; Notable for the following:    Glucose, Bld 111 (*)    ALT 16 (*)    All other components within normal limits  CBC - Abnormal; Notable for the following:    WBC 12.5 (*)    RDW 16.9 (*)    All other components  within normal limits  TROPONIN I  LIPASE, BLOOD  LACTIC ACID, PLASMA  URINALYSIS COMPLETEWITH MICROSCOPIC ([facility])   LACTIC ACID, PLASMA   ____________________________________________  EKG  ED ECG REPORT   Date: 07/04/2014  EKG Time: 446  Rate: 108  Rhythm: normal EKG, normal sinus rhythm, unchanged from previous tracings, atrial fibrillation, rate 108  Axis: Left axis deviation  Intervals:none  ST&T Change: None  ____________________________________________  RADIOLOGY  CT abdomen and pelvis shows an enlarging saccular infrarenal aortic aneurysm with central linear densities favoring septations without CT findings of active extravasation although not tailored for evaluation. Progressively atrophic right kidney without obstructive uropathy, trace ascites in the pelvis, increasing consolidation within the lung bases concerning for pneumonia. ____________________________________________   PROCEDURES  Procedure(s) performed: None  Critical Care performed: No  ____________________________________________   INITIAL IMPRESSION / ASSESSMENT AND PLAN / ED COURSE  Pertinent labs & imaging results that were available during my care of the patient were reviewed by me and considered in my medical decision making (see chart for details).  Patient is a [age] her old male who comes in with acute abdominal pain and fever tonight. We will check the patient's blood work as well as a CT scan to evaluate for possible causes of the patient's pain. And currently is saying that he is in no pain although he appears to be in some discomfort. He reports that he wants water and that he feels better and thinks he should be able to have water.  ----------------------------------------- 7:30 AM on 07/04/2014 -----------------------------------------  I was contacted by the radiologist to discuss the results of the patient's CT scan. The patient while he has been in the ER has been agitated because he  has wanted water. The patient has had some episodes of desaturation and difficulty breathing which is likely due to the pneumonia found on his CT scan. Although the patient does need to be seen and evaluated by vascular surgery I will admit the patient to the hospitalist service. His pneumonia and agitation. The patient will receive a dose of level for Floxin IV to treat the pneumonia and some Ativan 1 mg by mouth given his agitation. The patient will also receive 1 DuoNeb as he does have some increased work of breathing. ____________________________________________   FINAL CLINICAL IMPRESSION(S) / ED DIAGNOSES  Final diagnoses:  None  Abdominal pain Pneumonia AAA    Rebecka ApleyAllison P Webster, MD 07/04/14 [phone]

## 2014-07-04 NOTE — Progress Notes (Signed)
RD Assessment  Admitted with: abd pain, pneumonia PMHx: CHF, COPD, HTN  Current Diet: heart healthy, carb modified Typical Food/ Fluid Intake: No intake recorded, pt lunch tray at bedside- about 50%-75% eaten Meal/ Snack Patterns: patient reports eating a regular, low salt diet PTA. Reports a good appetite and intake PTA.  Supplements: None  Food Allergies: NKFA Food Preferences: Reviewed  Ht: 69" Current weight: 159# BMI: 23.6 Weight Changes: pt reports a UBW of 160-163#. Current weight is 99% of UBW range. Per MST, pt reports a weight loss of 2-13#, but patient reports only losing "a couple of pounds"- which is not significant.  UOP: Reviewed Digestive: Reviewed Gastrointestinal: Reviewed Skin: Reviewed, no concerns Physical Findings: n/a  Labs: Electrolyte and Renal Profile:    Recent Labs Lab 07/04/14 0456  BUN 15  CREATININE 0.93  NA 144  K 3.8   Protein Profile:  Recent Labs Lab 07/04/14 0456  ALBUMIN 3.7    Meds: Colace, Remeron  PES Statement: No nutrition concerns at this time.  Intervention: Meals and Snacks: Cater to patient preferences  Monitoring/ Evaluation: Energy Intake: goal for patient to meet >90% of estimated needs.  Joeseph Amorracey L. Gaines, RDN Pager: [phone] Office: [phone]   LOW Care Level

## 2014-07-04 NOTE — ED Notes (Signed)
Report called to unit RN..transported via stretcher with O2 and pump..Marland Kitchen

## 2014-07-04 NOTE — Clinical Social Work Note (Signed)
Clinical Social Work Assessment  Patient Details  Name: Darrell Mcconnell MRN: [id number] Date of Birth: Sep 25, 1935  Date of referral:  07/04/14               Reason for consult:  Discharge Planning, Other (Comment Required) (Pt admitted from a facility)                Permission sought to share information with:  Other Field seismologist(Coordinator of group home) Permission granted to share information::  Yes, Verbal Permission Granted  Name::      Lavonne Chick([facility])  Agency::     Relationship::     Contact Information:     Housing/Transportation Living arrangements for the past 2 months:  [facility] Source of Information:  Adult Children Patient Interpreter Needed:  None Criminal Activity/Legal Involvement Pertinent to Current Situation/Hospitalization:  No - Comment as needed Significant Relationships:  Adult Children Lives with:  Facility Resident Do you feel safe going back to the place where you live?  Yes Need for family participation in patient care:     Care giving concerns:   PT has recommended SNF, however Pt's [facility] He has chosen [facility]. RNCM has made referral    Social Worker assessment / plan: CSW spoke with pt's son, Darrell Mcconnell [facility](HCPOA) to address consult. CSW introduced herself and explained role of social work. PT is recommending SNF, however pt's son shared that pt has been to several SNF in the county and has not done well. Pt's son shared that [facility] is [facility] to [facility]. Pt's son feels that Hospice services would be helpful for pt to return home.   FL2 is completed and on chart for MD's signature. CSW will continue to follow.   Employment status:  Retired Health and safety inspectornsurance information:    PT Recommendations:  Skilled Teacher, early years/preursing Facility Information / Referral to community resources:   [employer](Hospice Services )  Patient/Family's Response to care:  Pt's son expressed what he felt would be the best  discharge plan for pt. Pt's son understands PT recommendations, however he shared that pt will only last a few days and "raise cane." He would like [facility].   Patient/Family's Understanding of and Emotional Response to Diagnosis, Current Treatment, and Prognosis:  Pt's son is aware of pt's prognosis and is agreeable to discharge plan.   Emotional Assessment Appearance:  Appears stated age Attitude/Demeanor/Rapport:  Other (Agreeable) Affect (typically observed):  Adaptable Orientation:  Fluctuating Orientation (Suspected and/or reported Sundowners) Alcohol / Substance use:  Never Used Psych involvement (Current and /or in the community):  No (Comment)  Discharge Needs  Concerns to be addressed:  No discharge needs identified Readmission within the last 30 days:  No Current discharge risk:  None Barriers to Discharge:  No Barriers Identified   Dede QuerySarah Alyzabeth Pontillo, LCSW 07/04/2014, 4:12 PM

## 2014-07-04 NOTE — Care Management (Signed)
Dr. Marty HeckPhifner spoke with Laban Emperorarrell, son of Mr. Piedad ClimesDix. [phone]). Discussed [facility]. The son would like [name], [facility] to make this decision. Hospice screening ordered. Telephone call to Ms [name] [phone] Would like [facility]. Will update Dayna BarkerKaren Robertson RN representative [facility]. Gwenette GreetBrenda S Holland RN MSN Care Management [phone]

## 2014-07-04 NOTE — ED Notes (Signed)
Pt presents to ED via EMS with c/o of abdominal pain. EMS states pt has had these sx since today. EMS states pt has had last bowel movement sine yesterday. EMS states pt has a hx of aortic aneurysm, COPD, and CHF.   VS per EMS listed below:   94% O2 RA  100.6 temperature

## 2014-07-05 ENCOUNTER — Inpatient Hospital Stay: Payer: Medicare Other

## 2014-07-05 LAB — CBC
HEMATOCRIT: 38.9 % — AB (ref 40.0–52.0)
Hemoglobin: 13 g/dL (ref 13.0–18.0)
MCH: 27.8 pg (ref 26.0–34.0)
MCHC: 33.4 g/dL (ref 32.0–36.0)
MCV: 83.2 fL (ref 80.0–100.0)
PLATELETS: 143 10*3/uL — AB (ref 150–440)
RBC: 4.67 MIL/uL (ref 4.40–5.90)
RDW: 17.2 % — ABNORMAL HIGH (ref 11.5–14.5)
WBC: 10 10*3/uL (ref 3.8–10.6)

## 2014-07-05 LAB — BASIC METABOLIC PANEL
Anion gap: 9 (ref 5–15)
BUN: 23 mg/dL — AB (ref 6–20)
CHLORIDE: 104 mmol/L (ref 101–111)
CO2: 27 mmol/L (ref 22–32)
Calcium: 8.9 mg/dL (ref 8.9–10.3)
Creatinine, Ser: 1.08 mg/dL (ref 0.61–1.24)
GFR calc non Af Amer: >60 mL/min (ref >60)
Glucose, Bld: 178 mg/dL — ABNORMAL HIGH (ref 65–99)
POTASSIUM: 4 mmol/L (ref 3.5–5.1)
SODIUM: 140 mmol/L (ref 135–145)

## 2014-07-05 LAB — HIV ANTIBODY (ROUTINE TESTING W REFLEX): HIV Screen 4th Generation wRfx: NONREACTIVE

## 2014-07-05 MED ORDER — AZITHROMYCIN 500 MG PO TABS: 500.0000 mg | ORAL_TABLET | Freq: Every day | ORAL | Status: DC

## 2014-07-05 MED ORDER — PREDNISONE 50 MG PO TABS: ORAL_TABLET | ORAL | Status: DC

## 2014-07-05 NOTE — Discharge Instructions (Signed)
°  DIET:  Regular diet  DISCHARGE CONDITION:  Stable  ACTIVITY:  Activity as tolerated  OXYGEN:  Home Oxygen: Yes.     Oxygen Delivery: 2 liters/min via Patient connected to nasal cannula oxygen  DISCHARGE LOCATION:  Assisted living Facility   If you experience worsening of your admission symptoms, develop shortness of breath, life threatening emergency, suicidal or homicidal thoughts you must seek medical attention immediately by calling 911 or calling your MD immediately  if symptoms less severe.  You Must read complete instructions/literature along with all the possible adverse reactions/side effects for all the Medicines you take and that have been prescribed to you. Take any new Medicines after you have completely understood and accpet all the possible adverse reactions/side effects.   Please note  You were cared for by a hospitalist during your hospital stay. If you have any questions about your discharge medications or the care you received while you were in the hospital after you are discharged, you can call the unit and asked to speak with the hospitalist on call if the hospitalist that took care of you is not available. Once you are discharged, your primary care physician will handle any further medical issues. Please note that NO REFILLS for any discharge medications will be authorized once you are discharged, as it is imperative that you return to your primary care physician (or establish a relationship with a primary care physician if you do not have one) for your aftercare needs so that they can reassess your need for medications and monitor your lab values.

## 2014-07-05 NOTE — Clinical Social Work Note (Signed)
Pt is [facility] for [facility]. FL2 has been updated with medication. CSW [facility] and she will be picking pt up by 3:30 PM. CSW left a message for pt's son. Pt is agreeable to discharge plan. CSW is signing off as no further needs identified.   Dede QuerySarah Lucerito Rosinski, MSW, LCSW Clinical Social Worker  [phone]

## 2014-07-05 NOTE — Discharge Summary (Signed)
[facility] [place] [facility]  DISCHARGE SUMMARY   PATIENT NAME: Darrell Mcconnell    MR#:  [id number]  DATE OF BIRTH:  [birth year]  DATE OF ADMISSION:  07/04/2014 ADMITTING PHYSICIAN: Altamese DillingVaibhavkumar Vachhani, MD  DATE OF DISCHARGE: 07/05/2014  PRIMARY CARE PHYSICIAN: No primary care provider on file.    ADMISSION DIAGNOSIS:  AAA (abdominal aortic aneurysm) [I71.4] Pneumonia [J18.9] Generalized abdominal pain [R10.84] Community acquired pneumonia [J18.9]  DISCHARGE DIAGNOSIS:  Principal Problem:   Pneumonia Active Problems:   COPD exacerbation   Aneurysm of aorta   SECONDARY DIAGNOSIS:   Past Medical History  Diagnosis Date  . CHF (congestive heart failure)   . COPD (chronic obstructive pulmonary disease)   . Aorta aneurysm   . Asthma   . Hypertension     HOSPITAL COURSE:   * COPD exacerbation: Patient with advanced COPD on chronic oxygen. He presented in respiratory distress. Was treated with IV Solu-Medrol nebulizers and antibiotics. 24 hours after admission he is breathing very comfortably no wheezing or distress. He continues on 2 L via nasal cannula. He is being discharged on 50 mg of prednisone 5 days, continue nebulizers inhalers and azithromycin. There is no pneumonia on chest x-ray, but CT scan shows possible consolidation in both bases. Blood cultures are negative.  *Community-acquired pneumonia: CT scan showing possible bilateral lower lobe pneumonias. Treating with azithromycin. No fever leukocytosis. Blood cultures negative.  * Chronic respiratory failure: Stable on 2 L via nasal cannula as he has been prior to this admission   * Abdominal aortic aneurysm: There is some enlargement in his aneurysm. This was discussed on admission with his son who is healthcare power of attorney. The patient and his son have decided not to pursue surgery.  * CHF chronic systolic No active exacerbation, continue monitoring.  *  Hypothyroidism Continue levothyroxine  * Hypertension Initially slightly hypotensive, metoprolol was held. Now blood pressure and heart rate have returned to normal will resume metoprolol and lisinopril  *Goals of care: Palliative care saw this patient in hospital. He is a DO NOT RESUSCITATE. He will be followed by hospice in the outpatient setting while he is at assisted living.    DISCHARGE CONDITIONS:   Fair  CONSULTS OBTAINED:  Treatment Team:  Steele BergNancy W Phifer, MD  DRUG ALLERGIES:  No Known Allergies  DISCHARGE MEDICATIONS:   Current Discharge Medication List    START taking these medications   Details  azithromycin (ZITHROMAX) 500 MG tablet Take 1 tablet (500 mg total) by mouth daily. Qty: 4 tablet, Refills: 0    predniSONE (DELTASONE) 50 MG tablet One table daily for 4 days Qty: 4 tablet, Refills: 0      CONTINUE these medications which have NOT CHANGED   Details  ALPRAZolam (XANAX) 0.25 MG tablet Take 0.25 mg by mouth every 8 (eight) hours as needed for anxiety.    aspirin EC 81 MG tablet Take 81 mg by mouth daily.    bimatoprost (LUMIGAN) 0.01 % SOLN Place 1 drop into both eyes daily. At 7pm    dicyclomine (BENTYL) 20 MG tablet Take 20 mg by mouth every 8 (eight) hours as needed (diarrhea).    digoxin (LANOXIN) 0.125 MG tablet Take 0.125 mg by mouth daily.    docusate sodium (COLACE) 100 MG capsule Take 100 mg by mouth 2 (two) times daily.    escitalopram (LEXAPRO) 10 MG tablet Take 15 mg by mouth daily.    finasteride (PROSCAR) 5 MG tablet Take 5  mg by mouth daily.    Fluticasone-Salmeterol (ADVAIR) 250-50 MCG/DOSE AEPB Inhale 1 puff into the lungs every 12 (twelve) hours.    furosemide (LASIX) 40 MG tablet Take 40 mg by mouth daily.    hydrocortisone cream 1 % Apply 1 application topically as directed. Apply as needed to rash    ipratropium-albuterol (DUONEB) 0.5-2.5 (3) MG/3ML SOLN Take 3 mLs by nebulization 3 (three) times daily as needed  (shortness of breath/wheezing).    levothyroxine (SYNTHROID, LEVOTHROID) 25 MCG tablet Take 25 mcg by mouth daily before breakfast.    lisinopril (PRINIVIL,ZESTRIL) 2.5 MG tablet Take 2.5 mg by mouth daily.    Melatonin 3 MG TABS Take 1 tablet by mouth at bedtime.    meloxicam (MOBIC) 7.5 MG tablet Take 7.5 mg by mouth daily.    metoprolol tartrate (LOPRESSOR) 25 MG tablet Take 12.5 mg by mouth 2 (two) times daily.    mirtazapine (REMERON) 30 MG tablet Take 30 mg by mouth at bedtime.    nystatin (MYCOSTATIN) 100000 UNIT/ML suspension Take 5 mLs by mouth every 4 (four) hours while awake. Swish and spit every 4-6 hours    omeprazole (PRILOSEC) 20 MG capsule Take 20 mg by mouth daily.    OVER THE COUNTER MEDICATION Take 1 tablet by mouth daily. PrePlus CA-FE 27 mg- FA 1    polyethylene glycol (MIRALAX / GLYCOLAX) packet Take 17 g by mouth daily as needed for moderate constipation.    !! QUEtiapine (SEROQUEL) 25 MG tablet Take 25 mg by mouth 3 (three) times daily.    !! QUEtiapine (SEROQUEL) 50 MG tablet Take 50 mg by mouth at bedtime.    triamcinolone cream (KENALOG) 0.1 % Apply 1 application topically 2 (two) times daily as needed (rash).    Vitamin D, Cholecalciferol, 400 UNITS CAPS Take 1 tablet by mouth daily.     !! - Potential duplicate medications found. Please discuss with provider.       DISCHARGE INSTRUCTIONS:   DIET:  Regular diet  DISCHARGE CONDITION:  Stable  ACTIVITY:  Activity as tolerated  OXYGEN:  Home Oxygen: Yes.     Oxygen Delivery: 2 liters/min via Patient connected to nasal cannula oxygen  DISCHARGE LOCATION:  Assisted living Facility   If you experience worsening of your admission symptoms, develop shortness of breath, life threatening emergency, suicidal or homicidal thoughts you must seek medical attention immediately by calling 911 or calling your MD immediately  if symptoms less severe.  You Must read complete instructions/literature  along with all the possible adverse reactions/side effects for all the Medicines you take and that have been prescribed to you. Take any new Medicines after you have completely understood and accpet all the possible adverse reactions/side effects.   Please note  You were cared for by a hospitalist during your hospital stay. If you have any questions about your discharge medications or the care you received while you were in the hospital after you are discharged, you can call the unit and asked to speak with the hospitalist on call if the hospitalist that took care of you is not available. Once you are discharged, your primary care physician will handle any further medical issues. Please note that NO REFILLS for any discharge medications will be authorized once you are discharged, as it is imperative that you return to your primary care physician (or establish a relationship with a primary care physician if you do not have one) for your aftercare needs so that they can reassess your  need for medications and monitor your lab values.    Today   CHIEF COMPLAINT:   No complaints today. Breathing easily. Comfortable.  HISTORY OF PRESENT ILLNESS:   Pt have baseline dementia and lives in a assisted living facility. He has order of DNR. Called the facility and they gave number of his sonDeberah Castle- HCPOA- DARRYOL Carbary- [phone], I spoke to him.  Darrell Mcconnell is a 79 y.o. male with a known history of COPD on home oxygen, hypertension, anxiety/depression, dementia, BPH, CHF with EF of 35%, aortic aneurysm, hypothyroidism and glaucoma. From assisted living facility he is sent to the emergency room because he is more confused for last 1-2 days. As per the nurse at an assisted living facility he had complained of abdominal pain and chills since yesterday.  In ER he is found to have an enlargement of his abdominal aortic aneurysm in CAT scan and had some evidence of pneumonia. I spoke to his son on the phone about  these findings, as per him patient is a DO NOT RESUSCITATE and he would not like to have the surgery for aortic aneurysm. But he agreed to admit him for pneumonia.    VITAL SIGNS:  Blood pressure 127/75, pulse 78, temperature 98.2 F (36.8 C), temperature source Oral, resp. rate 18, height 5\' 9"  (1.753 m), weight 72.349 kg (159 lb 8 oz), SpO2 94 %.  I/O:   Intake/Output Summary (Last 24 hours) at 07/05/14 1207 Last data filed at 07/05/14 0900  Gross per 24 hour  Intake    360 ml  Output      0 ml  Net    360 ml    PHYSICAL EXAMINATION:  GENERAL:  79 y.o.-year-old patient lying in the bed with no acute distress.  EYES: Pupils equal, round, reactive to light and accommodation. No scleral icterus. Extraocular muscles intact.  HEENT: Head atraumatic, normocephalic. Oropharynx and nasopharynx clear.  NECK:  Supple, no jugular venous distention. No thyroid enlargement, no tenderness.  LUNGS: Normal breath sounds bilaterally, no wheezing, rales,rhonchi or crepitation. No use of accessory muscles of respiration. Nasal cannula in place at 2 L  CARDIOVASCULAR: S1, S2 normal. No murmurs, rubs, or gallops.  ABDOMEN: Soft, non-tender, non-distended. Bowel sounds present. No organomegaly or mass.  EXTREMITIES: No pedal edema, cyanosis, or clubbing.  NEUROLOGIC: Cranial nerves II through XII are intact. Muscle strength 5/5 in all extremities. Sensation intact. Gait not checked.  PSYCHIATRIC: The patient is alert and oriented x 3.  SKIN: No obvious rash, lesion, or ulcer.   DATA REVIEW:   CBC  Recent Labs Lab 07/05/14 0512  WBC 10.0  HGB 13.0  HCT 38.9*  PLT 143*    Chemistries   Recent Labs Lab 07/04/14 0456 07/05/14 0512  NA 144 140  K 3.8 4.0  CL 107 104  CO2 26 27  GLUCOSE 111* 178*  BUN 15 23*  CREATININE 0.93 1.08  CALCIUM 9.0 8.9  AST 29  --   ALT 16*  --   ALKPHOS 76  --   BILITOT 0.7  --     Cardiac Enzymes  Recent Labs Lab 07/04/14 0456  TROPONINI <0.03     Microbiology Results  Results for orders placed or performed during the hospital encounter of 07/04/14  Culture, blood (routine x 2)     Status: None (Preliminary result)   Collection Time: 07/04/14  8:37 AM  Result Value Ref Range Status   Specimen Description BLOOD  Final   Special  Requests Normal  Final   Culture NO GROWTH 1 DAY  Final   Report Status PENDING  Incomplete  Culture, blood (routine x 2)     Status: None (Preliminary result)   Collection Time: 07/04/14  8:38 AM  Result Value Ref Range Status   Specimen Description BLOOD  Final   Special Requests Normal  Final   Culture NO GROWTH 1 DAY  Final   Report Status PENDING  Incomplete    RADIOLOGY:  Dg Chest 2 View  07/05/2014   CLINICAL DATA:  Pneumonia congestive heart failure COPD  EXAM: CHEST  2 VIEW  COMPARISON:  07/04/2014  FINDINGS: Stable cardiac enlargement and uncoiling of the aorta. Status post CABG. Vascular pattern normal. No consolidation or pneumothorax. Mild discoid atelectasis right lower lobe with mild atelectasis or scarring left lung base. Tiny bilateral effusions possible. Two small right apical nodular opacities and 1 nodular opacity lateral left midlung zone all measuring about 4-5 mm. These are not clearly seen on the prior study. A CT thorax was performed 12/20/2013 and demonstrated an 8 mm nodular opacity in the right apex.  IMPRESSION: No findings to suggest pneumonia. Bilateral nodular opacities suspected. Further evaluation with CT thorax is suggested.   Electronically Signed   By: Esperanza Heir M.D.   On: 07/05/2014 11:31   Dg Chest 2 View  07/04/2014   CLINICAL DATA:  Pain  EXAM: CHEST  2 VIEW  COMPARISON:  04/20/2014  FINDINGS: Cardiac shadow is enlarged. Postsurgical changes are again noted. No focal infiltrate or sizable effusion is seen. Elevation of the left hemidiaphragm is again noted with minimal compressive atelectasis. The upper abdomen shows the stomach to be well distended.   IMPRESSION: No acute abnormality noted.   Electronically Signed   By: Alcide Clever M.D.   On: 07/04/2014 08:21   Ct Abdomen Pelvis W Contrast  07/04/2014   CLINICAL DATA:  Abdominal pain beginning today. History of aortic aneurysm repair, COPD, CHF.  EXAM: CT ABDOMEN AND PELVIS WITH CONTRAST  TECHNIQUE: Multidetector CT imaging of the abdomen and pelvis was performed using the standard protocol following bolus administration of intravenous contrast.  CONTRAST:  OMNIPAQUE IOHEXOL 300 MG/ML  SOLN  COMPARISON:  CT of the abdomen and pelvis February 27, 2013  FINDINGS: LUNG BASES: Patchy consolidation/ tree-in-bud infiltrates in the lung bases bilaterally. Status post median sternotomy. Heart is mildly enlarged, no pericardial fluid collections.  SOLID ORGANS: Enhancing 16 mm lesion in RIGHT lobe of the liver with early washout likely represents a hemangioma, unchanged. The liver is otherwise unremarkable. Multiple tiny layering gallstones versus gallbladder sludge without CT findings of acute cholecystitis. Predominately atrophic pancreas. Adrenal glands are unremarkable.  GASTROINTESTINAL TRACT: The stomach, small and large bowel are normal in course and caliber without inflammatory changes. 5 mm appendicolith, appendix is otherwise unremarkable. Moderate colonic diverticulosis.  KIDNEYS/ URINARY TRACT: Kidneys are orthotopic, worsening RIGHT renal atrophy. Delayed RIGHT nephrogram consistent with dysfunction. Too small to characterize hypodensities in the kidneys bilaterally. Decompressed RIGHT ureter, improved. Urinary bladder is partially distended and unremarkable.  PERITONEUM/RETROPERITONEUM: Enlarging infrarenal saccular aortic aneurysm is 6 .4 x 7.3 cm, previously 5.8 x 7 cm. Linear densities within the aorta are relatively unchanged between arterial and delayed phase. Occluded RIGHT internal at artery. No lymphadenopathy by CT size criteria. Prostate is mildly enlarged. Trace amount of ascites in the  pelvis.  SOFT TISSUE/OSSEOUS STRUCTURES: Non-suspicious. Small RIGHT, moderate LEFT fat containing inguinal hernia. Small fat containing ventral hernia.  IMPRESSION: Enlarging saccular infrarenal aortic aneurysm, with central linear densities favoring septations without CT findings of active extravasation though not tailored for evaluation. Consider vascular consultation.  Progressively atrophic RIGHT kidney without obstructive uropathy.  Trace amount of ascites in the pelvis.  Increasing consolidation within the lung bases concerning for pneumonia.  Acute findings discussed with and reconfirmed by Dr.ALLISON WEBSTER on 07/04/2014 at 6:59 am.   Electronically Signed   By: Awilda Metro   On: 07/04/2014 07:01    EKG:   Orders placed or performed during the hospital encounter of 07/04/14  . EKG 12-Lead  . EKG 12-Lead      Management plans discussed with the patient, family and they are in agreement.  CODE STATUS:     Code Status Orders        Start     Ordered   07/04/14 1004  Do not attempt resuscitation (DNR)   Continuous    Question Answer Comment  In the event of cardiac or respiratory ARREST Do not call a "code blue"   In the event of cardiac or respiratory ARREST Do not perform Intubation, CPR, defibrillation or ACLS   In the event of cardiac or respiratory ARREST Use medication by any route, position, wound care, and other measures to relive pain and suffering. May use oxygen, suction and manual treatment of airway obstruction as needed for comfort.   Comments confirmed with son on phone, pt have a paper on chart at ALF where he lives.      07/04/14 1003      TOTAL TIME TAKING CARE OF THIS PATIENT: 40 minutes.    Elby Showers M.D on 07/05/2014 at 12:07 PM  Between 7am to 6pm - Pager - [phone]  After 6pm go to [email or web address] - password [email or web address] [facility]  [facility]  Office  [phone]  CC: Primary care physician; No primary care provider on  file.

## 2014-07-05 NOTE — Progress Notes (Addendum)
Referral received for services of [facility] [place] [facility] a [facility] consult. Mr. Darrell Mcconnell is a 79 year old man who [facility] where he has been a resident for the past 3 years. [facility] through the ED for treatment of a COPD exacerbation probable,PNA and abdominal pain. He has a PMH of dementia (probably Alzheimer's) with behavioral disturbance (psych consult 02/12/2011), AAA, AAA repair 2001, CAD s/p CABG (11/2010), CM with EF 35-40% (ECHO 09/04/13), mod-severe MR/TR, COPD, h/o tobacco use, HTN, hyperlipidemia, recurrent UTI's, h/o SP cath, R.hydronephrosis/hydroureter, MDD with BH admission for suicide ideation, PUD, vision loss due to glaucoma, CVA, a.fib, B-12 deficiency. He was admitted 07/04/14 with abd pain. CT shows enarging AAA (7.3 x 6.4 cm) but no evidence of rupture.  Patient seen at bedside, alert and oriented x 3, very pleasant and interactive with Clinical research associatewriter. Oxygen via nasal cannula at 2 Liters, no shortness of breath noted. Due to patient's hx of dementia services were explained via phone to patient's son [name] [phone]) who voiced understanding and agreement. Call [facility] to [facility], Clinical research associatewriter spoke with owner Mrs. McCormick who verified patient does have oxygen and a walker in his room. She had several questions regarding discharge medications/prescriptions/timing, writer advised her that hospital CSW would be in contact with her regarding specific discharge instructions/timing. Discharge instructions and patient information faxed to referral intake. Out of facility DNR and discharge prescriptions in place on hospital chart. CSW Maralyn SagoSarah made aware of plan.Thank you for the opportunity to serve Mr. Darrell Mcconnell and his family. Dayna BarkerKaren Robertson RN, [facility] Center[facility],CHPN [facility] [place], [facility] L[facility]ison [phone]

## 2014-07-06 LAB — MISC LABCORP TEST (SEND OUT): LABCORP TEST CODE: 182246

## 2014-07-09 LAB — CULTURE, BLOOD (ROUTINE X 2)
CULTURE: NO GROWTH
Culture: NO GROWTH
SPECIAL REQUESTS: NORMAL
SPECIAL REQUESTS: NORMAL

## 2014-07-10 DIAGNOSIS — H4011X3 Primary open-angle glaucoma, severe stage: Secondary | ICD-10-CM | POA: Diagnosis not present

## 2014-07-12 DIAGNOSIS — Z9981 Dependence on supplemental oxygen: Secondary | ICD-10-CM | POA: Diagnosis not present

## 2014-07-12 DIAGNOSIS — R10817 Generalized abdominal tenderness: Secondary | ICD-10-CM | POA: Diagnosis not present

## 2014-07-12 DIAGNOSIS — R0602 Shortness of breath: Secondary | ICD-10-CM | POA: Diagnosis not present

## 2014-07-12 DIAGNOSIS — Z0001 Encounter for general adult medical examination with abnormal findings: Secondary | ICD-10-CM | POA: Diagnosis not present

## 2014-07-12 DIAGNOSIS — J449 Chronic obstructive pulmonary disease, unspecified: Secondary | ICD-10-CM | POA: Diagnosis not present

## 2014-07-12 DIAGNOSIS — I714 Abdominal aortic aneurysm, without rupture: Secondary | ICD-10-CM | POA: Diagnosis not present

## 2014-07-25 LAB — CULTURE, BLOOD (ROUTINE X 2)

## 2014-08-08 DIAGNOSIS — F331 Major depressive disorder, recurrent, moderate: Secondary | ICD-10-CM | POA: Diagnosis not present

## 2014-08-17 IMAGING — CT CT HEAD WITHOUT CONTRAST
1 series · 16 of 30 positions shown, 20 images · non-contrast
Comparison: 06/25/2013.

CLINICAL DATA: Headache.  Stroke.  Short of breath.

EXAM:
CT HEAD WITHOUT CONTRAST
TECHNIQUE: Contiguous axial images were obtained from the base of the skull
through the vertex without intravenous contrast.

[Series 2: soft tissue · axial · 0.47mm/px · z∈[+257,+392]mm · 16 of 30 slices shown, 20 images]
[im 2/30  brain]
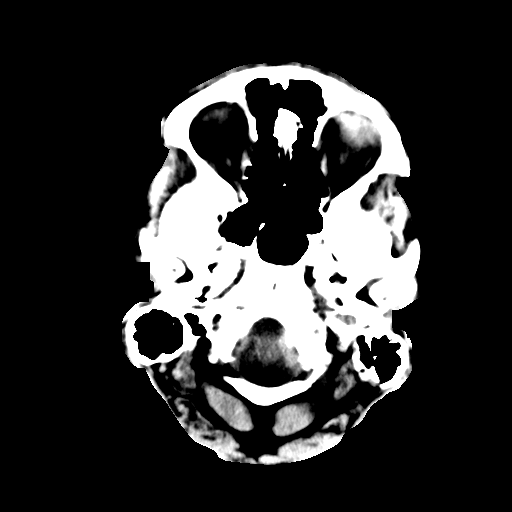
[im 2/30  bone]
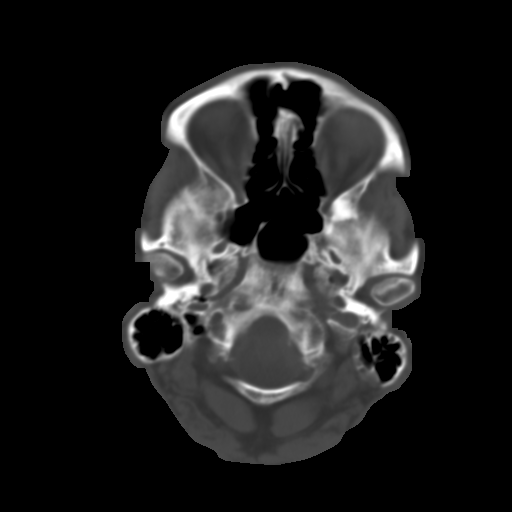
[im 4/30  brain]
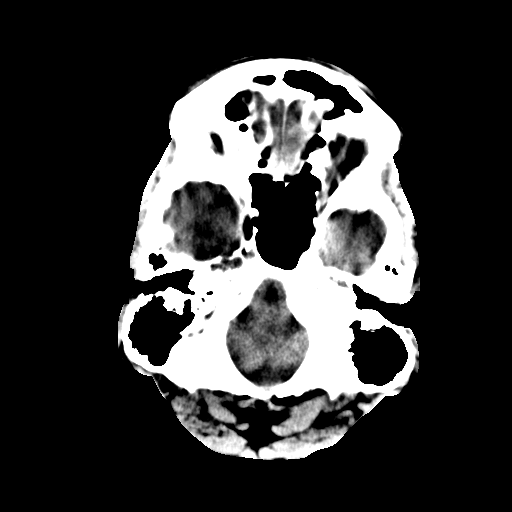
[im 6/30  brain]
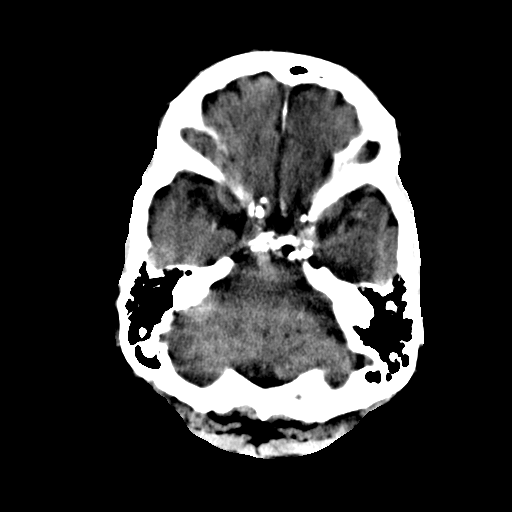
[im 8/30  brain]
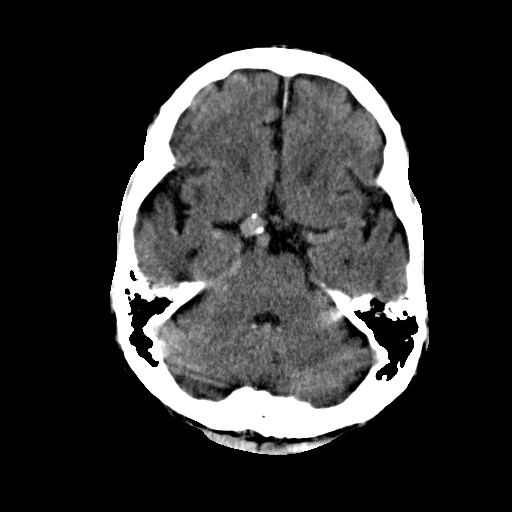
[im 9/30  brain]
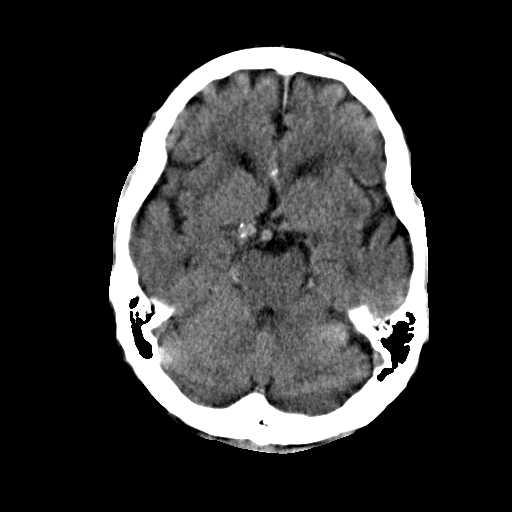
[im 9/30  bone]
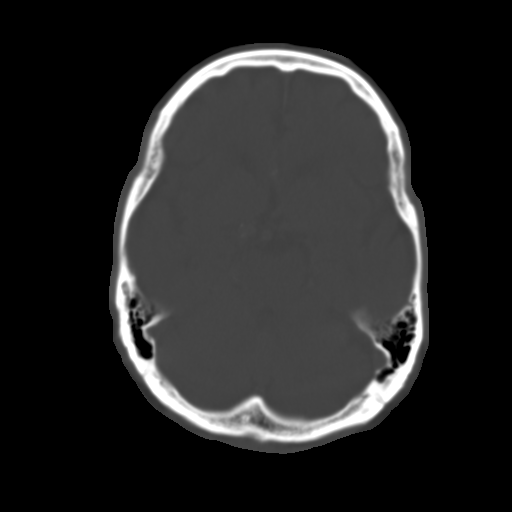
[im 11/30  brain]
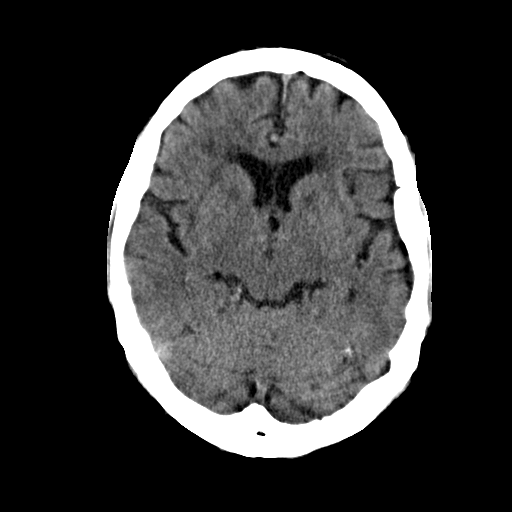
[im 13/30  brain]
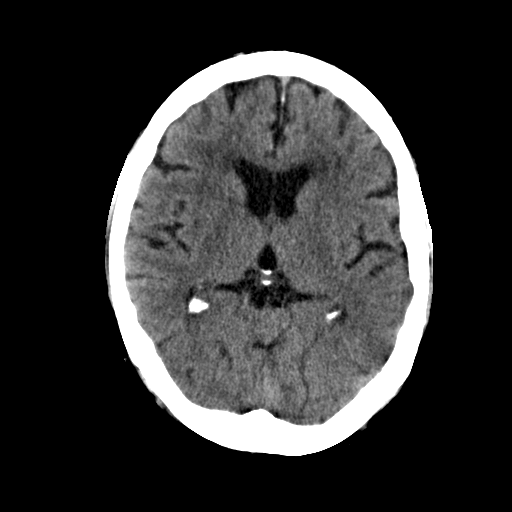
[im 15/30  brain]
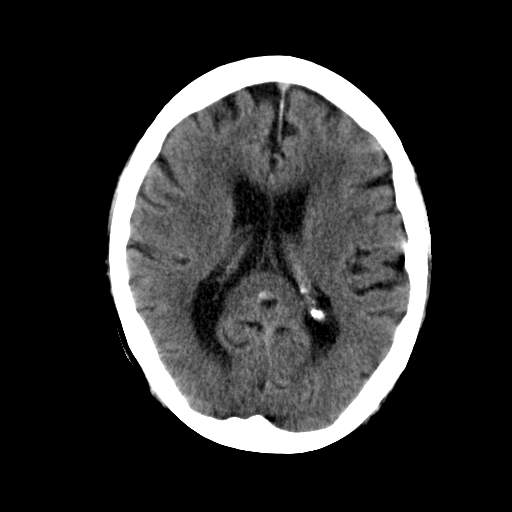
[im 16/30  brain]
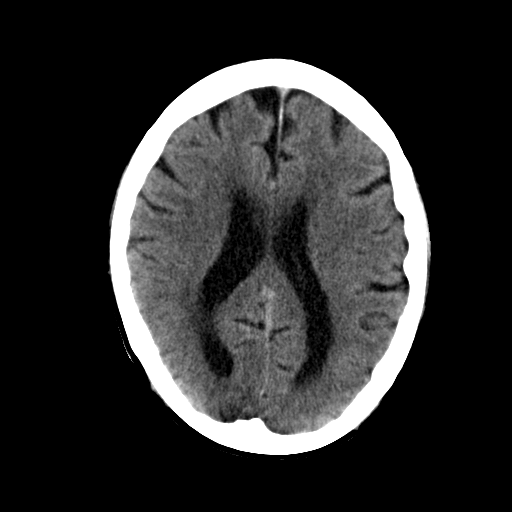
[im 16/30  bone]
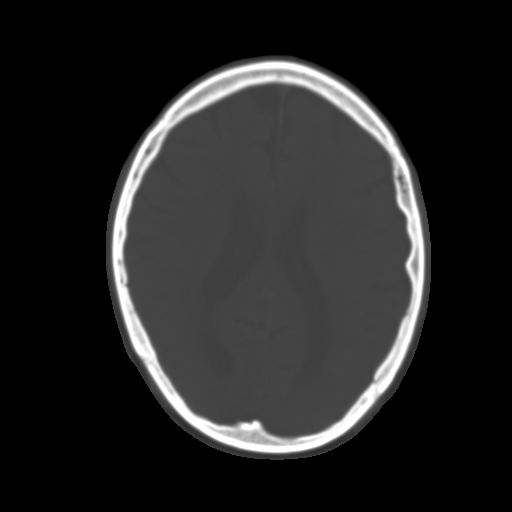
[im 18/30  brain]
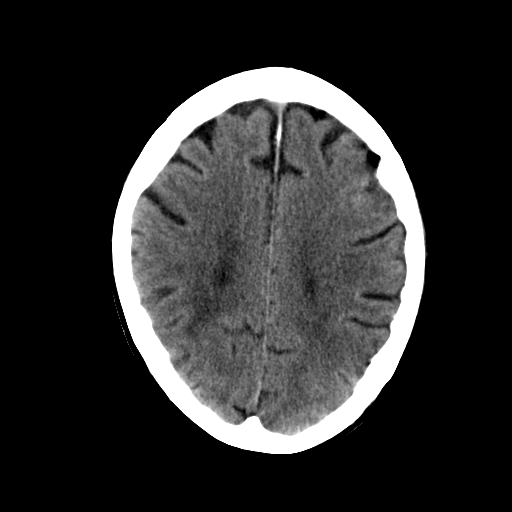
[im 20/30  brain]
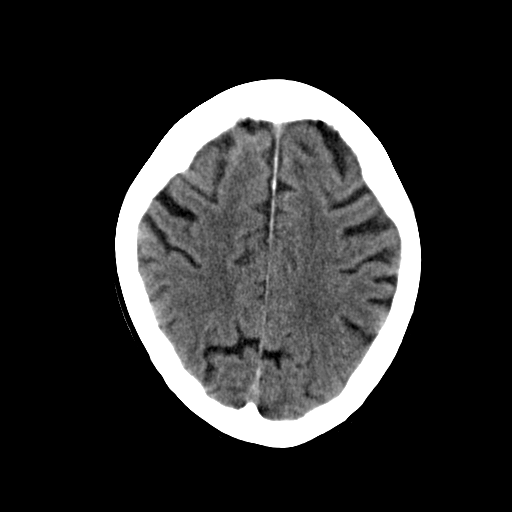
[im 22/30  brain]
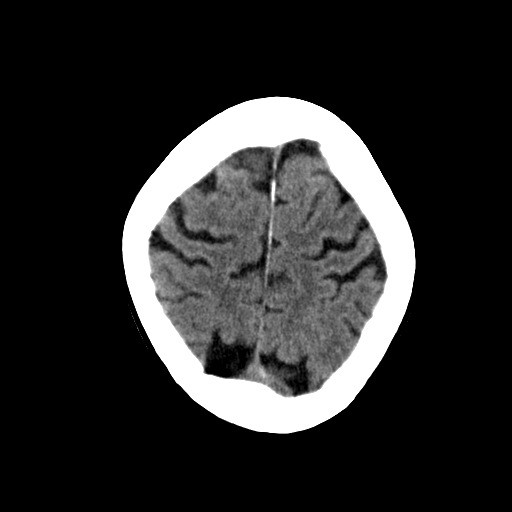
[im 23/30  brain]
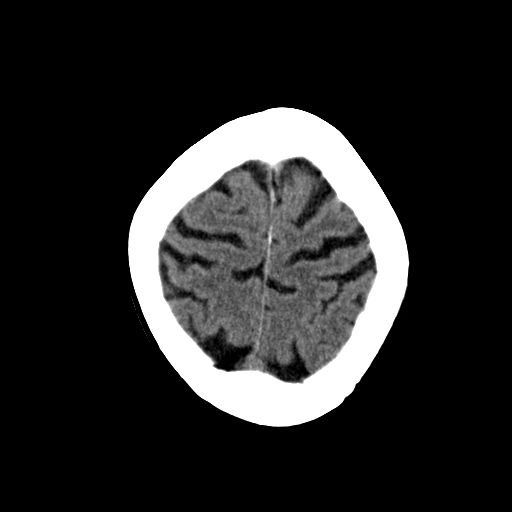
[im 23/30  bone]
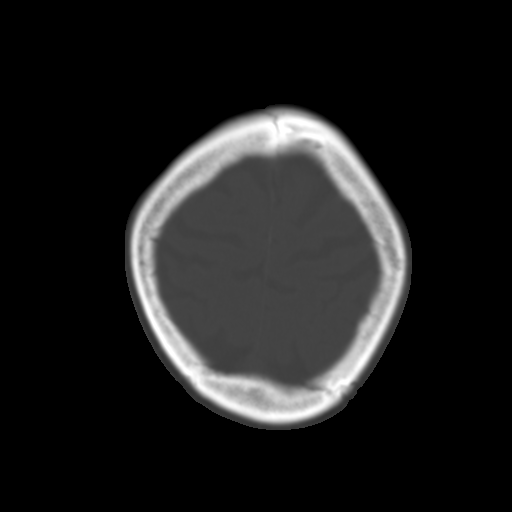
[im 25/30  brain]
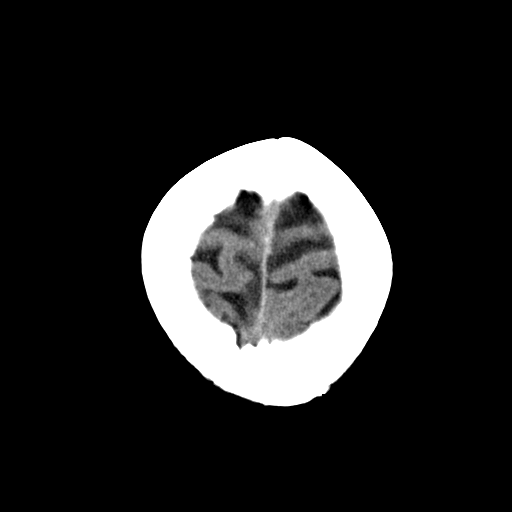
[im 27/30  brain]
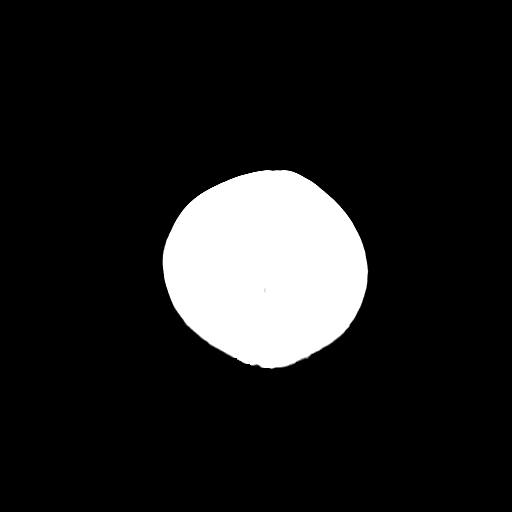
[im 29/30  brain]
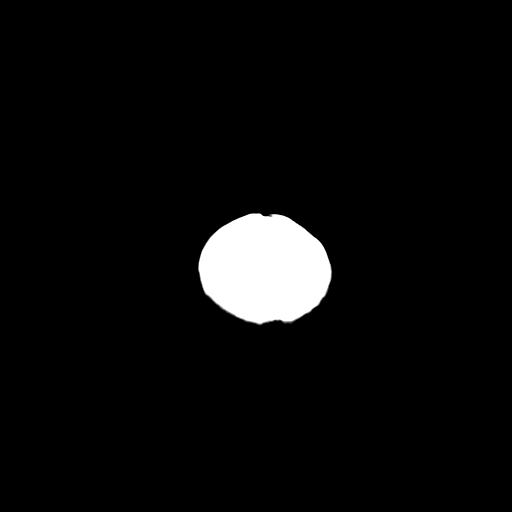

[16 of 30 positions shown; findings below may reference images not displayed]

FINDINGS: No mass lesion, mass effect, midline shift, hydrocephalus,
hemorrhage. No acute territorial cortical ischemia/infarct. Atrophy
and chronic ischemic white matter disease is present. Old right
caudate head lacunar infarct. Intracranial atherosclerosis. Ectatic
right middle cerebral artery is a chronic finding. Calvarium intact.
Paranasal sinuses and mastoid air cells are within normal limits.
Scout images appear normal. Vertebrobasilar dolichoectasia.
IMPRESSION: Atrophy, chronic ischemic white matter disease and small lacunar
infarcts without acute intracranial abnormality. No interval change.

## 2014-08-28 DIAGNOSIS — H4011X3 Primary open-angle glaucoma, severe stage: Secondary | ICD-10-CM | POA: Diagnosis not present

## 2014-09-10 DIAGNOSIS — H4011X3 Primary open-angle glaucoma, severe stage: Secondary | ICD-10-CM | POA: Diagnosis not present

## 2014-09-24 ENCOUNTER — Other Ambulatory Visit: Payer: Self-pay | Admitting: Internal Medicine

## 2014-09-24 ENCOUNTER — Ambulatory Visit
Admission: RE | Admit: 2014-09-24 | Discharge: 2014-09-24 | Disposition: A | Discharge status: Home / Self Care | Payer: Medicare Other | Source: Ambulatory Visit | Attending: Internal Medicine | Admitting: Internal Medicine

## 2014-09-24 DIAGNOSIS — Z029 Encounter for administrative examinations, unspecified: Secondary | ICD-10-CM | POA: Diagnosis not present

## 2014-09-24 DIAGNOSIS — M25551 Pain in right hip: Secondary | ICD-10-CM

## 2014-09-24 DIAGNOSIS — S7001XA Contusion of right hip, initial encounter: Secondary | ICD-10-CM | POA: Diagnosis not present

## 2014-10-04 DIAGNOSIS — J449 Chronic obstructive pulmonary disease, unspecified: Secondary | ICD-10-CM | POA: Diagnosis not present

## 2014-10-05 ENCOUNTER — Other Ambulatory Visit: Payer: Self-pay

## 2014-10-05 ENCOUNTER — Emergency Department
Admission: EM | Admit: 2014-10-05 | Discharge: 2014-10-05 | Disposition: A | Discharge status: Skilled Nursing Facility | Payer: Medicare Other | Attending: Emergency Medicine | Admitting: Emergency Medicine

## 2014-10-05 DIAGNOSIS — Z7982 Long term (current) use of aspirin: Secondary | ICD-10-CM | POA: Insufficient documentation

## 2014-10-05 DIAGNOSIS — Z79899 Other long term (current) drug therapy: Secondary | ICD-10-CM | POA: Insufficient documentation

## 2014-10-05 DIAGNOSIS — I10 Essential (primary) hypertension: Secondary | ICD-10-CM | POA: Diagnosis not present

## 2014-10-05 DIAGNOSIS — R079 Chest pain, unspecified: Secondary | ICD-10-CM | POA: Diagnosis not present

## 2014-10-05 DIAGNOSIS — Z720 Tobacco use: Secondary | ICD-10-CM | POA: Diagnosis not present

## 2014-10-05 LAB — COMPREHENSIVE METABOLIC PANEL
ALK PHOS: 70 U/L (ref 38–126)
ALT: 13 U/L — ABNORMAL LOW (ref 17–63)
AST: 23 U/L (ref 15–41)
Albumin: 3.4 g/dL — ABNORMAL LOW (ref 3.5–5.0)
Anion gap: 7 (ref 5–15)
BILIRUBIN TOTAL: 0.5 mg/dL (ref 0.3–1.2)
BUN: 17 mg/dL (ref 6–20)
CHLORIDE: 103 mmol/L (ref 101–111)
CO2: 30 mmol/L (ref 22–32)
CREATININE: 1.18 mg/dL (ref 0.61–1.24)
Calcium: 8.8 mg/dL — ABNORMAL LOW (ref 8.9–10.3)
GFR calc non Af Amer: 58 mL/min — ABNORMAL LOW (ref >60)
GLUCOSE: 90 mg/dL (ref 65–99)
Potassium: 3.6 mmol/L (ref 3.5–5.1)
Sodium: 140 mmol/L (ref 135–145)
TOTAL PROTEIN: 6.5 g/dL (ref 6.5–8.1)

## 2014-10-05 LAB — CBC
HCT: 37.2 % — ABNORMAL LOW (ref 40.0–52.0)
Hemoglobin: 12.1 g/dL — ABNORMAL LOW (ref 13.0–18.0)
MCH: 27 pg (ref 26.0–34.0)
MCHC: 32.5 g/dL (ref 32.0–36.0)
MCV: 82.9 fL (ref 80.0–100.0)
PLATELETS: 127 10*3/uL — AB (ref 150–440)
RBC: 4.48 MIL/uL (ref 4.40–5.90)
RDW: 16.7 % — ABNORMAL HIGH (ref 11.5–14.5)
WBC: 12.9 10*3/uL — ABNORMAL HIGH (ref 3.8–10.6)

## 2014-10-05 LAB — TROPONIN I: Troponin I: 0.03 ng/mL (ref <0.031)

## 2014-10-05 MED ORDER — GI COCKTAIL ~~LOC~~
30.0000 mL | Freq: Once | ORAL | Status: AC
  Administered 2014-10-05: 30 mL via ORAL

## 2014-10-05 MED ORDER — GI COCKTAIL ~~LOC~~
ORAL | Status: AC
  Administered 2014-10-05: 30 mL via ORAL
  Filled 2014-10-05: qty 30

## 2014-10-05 NOTE — ED Notes (Signed)
Pt requested something to eat.   He was given a cup of vanilla ice cream and he tolerated with no difficulty and no pain after eating.   He is sitting in chair right in his door way waiting for a ride.

## 2014-10-05 NOTE — ED Notes (Signed)
Talked with Haywood Lasso, Production designer, theatre/television/film from [facility] nursing facility.  She was given report on the patient and she states that she will be here to pick him up by 1630.  Pt was assisted OOB to use the commode in the room.   He was also changed back into his clothes and is sitting in the chair.  Call bell is attached.

## 2014-10-05 NOTE — Discharge Instructions (Signed)

## 2014-10-05 NOTE — ED Provider Notes (Signed)
[facility] Emergency Department Provider Note  ____________________________________________  Time seen: On arrival  I have reviewed the triage vital signs and the nursing notes.   HISTORY  Chief Complaint Chest Pain  Patient with changing story, possible dementia?  HPI Darrell Mcconnell is a 79 y.o. male who presents with complaints of mild to moderate burning in his chest which he blames on acid reflux. He denies abdominal pain to me. The pain does not travel anywhere. He does have an extensive coronary artery disease history including history of AAA's which he has opted not to have surgery on. He continues asking for coffee from the nurse     Past Medical History  Diagnosis Date  . CHF (congestive heart failure)   . COPD (chronic obstructive pulmonary disease)   . Aorta aneurysm   . Asthma   . Hypertension     Patient Active Problem List   Diagnosis Date Noted  . Pneumonia 07/04/2014  . COPD exacerbation 07/04/2014  . Aneurysm of aorta 07/04/2014    No past surgical history on file.  Current Outpatient Rx  Name  Route  Sig  Dispense  Refill  . acetaminophen (TYLENOL) 650 MG CR tablet   Oral   Take 650 mg by mouth 2 (two) times daily as needed for pain.         Marland Kitchen albuterol (PROVENTIL HFA;VENTOLIN HFA) 108 (90 BASE) MCG/ACT inhaler   Inhalation   Inhale 1 puff into the lungs 4 (four) times daily as needed for wheezing or shortness of breath.         . ALPRAZolam (XANAX) 0.25 MG tablet   Oral   Take 0.25-0.5 mg by mouth 3 (three) times daily as needed for anxiety.          Marland Kitchen alum & mag hydroxide-simeth (MAALOX PLUS) 400-400-40 MG/5ML suspension   Oral   Take 30 mLs by mouth every 6 (six) hours as needed for indigestion.         Marland Kitchen aspirin EC 81 MG tablet   Oral   Take 81 mg by mouth every evening.          . bimatoprost (LUMIGAN) 0.01 % SOLN   Both Eyes   Place 1 drop into both eyes every evening.          .  brimonidine-timolol (COMBIGAN) 0.2-0.5 % ophthalmic solution   Both Eyes   Place 1 drop into both eyes 2 (two) times daily.         . cholecalciferol (VITAMIN D) 400 UNITS TABS tablet   Oral   Take 400 Units by mouth daily.         Marland Kitchen dicyclomine (BENTYL) 20 MG tablet   Oral   Take 20 mg by mouth every 8 (eight) hours as needed (for diarrhea).          . digoxin (LANOXIN) 0.125 MG tablet   Oral   Take 0.125 mg by mouth daily.         Marland Kitchen docusate sodium (COLACE) 100 MG capsule   Oral   Take 100 mg by mouth 2 (two) times daily.         . dorzolamide (TRUSOPT) 2 % ophthalmic solution   Both Eyes   Place 1 drop into both eyes 2 (two) times daily.         Marland Kitchen escitalopram (LEXAPRO) 10 MG tablet   Oral   Take 15 mg by mouth daily.         Marland Kitchen  finasteride (PROSCAR) 5 MG tablet   Oral   Take 5 mg by mouth daily.         . furosemide (LASIX) 40 MG tablet   Oral   Take 40 mg by mouth daily.         . hydrocortisone cream 1 %   Topical   Apply 1 application topically as needed for itching (and rash).          Marland Kitchen ipratropium-albuterol (DUONEB) 0.5-2.5 (3) MG/3ML SOLN   Nebulization   Take 3 mLs by nebulization 3 (three) times daily as needed (for shortness of breath/wheezing).          Marland Kitchen levothyroxine (SYNTHROID, LEVOTHROID) 25 MCG tablet   Oral   Take 25 mcg by mouth daily before breakfast.         . lisinopril (PRINIVIL,ZESTRIL) 2.5 MG tablet   Oral   Take 2.5 mg by mouth daily.         . Melatonin 3 MG TABS   Oral   Take 1 tablet by mouth at bedtime.         . meloxicam (MOBIC) 7.5 MG tablet   Oral   Take 7.5 mg by mouth daily.         . metoprolol tartrate (LOPRESSOR) 25 MG tablet   Oral   Take 12.5 mg by mouth daily.          . mirtazapine (REMERON) 30 MG tablet   Oral   Take 30 mg by mouth at bedtime.         . ondansetron (ZOFRAN) 8 MG tablet   Oral   Take 8 mg by mouth every 6 (six) hours as needed for nausea or vomiting.          . pantoprazole (PROTONIX) 40 MG tablet   Oral   Take 40 mg by mouth daily.         . polyethylene glycol (MIRALAX / GLYCOLAX) packet   Oral   Take 17 g by mouth daily as needed for moderate constipation.         . Prenatal Vit-Fe Fumarate-FA (PREPLUS) 27-1 MG TABS   Oral   Take 1 tablet by mouth daily.         . QUEtiapine (SEROQUEL) 25 MG tablet   Oral   Take 25 mg by mouth 3 (three) times daily.         . QUEtiapine (SEROQUEL) 50 MG tablet   Oral   Take 50 mg by mouth at bedtime.         . simethicone (MYLICON) 80 MG chewable tablet   Oral   Chew 80 mg by mouth every 4 (four) hours as needed for flatulence.         . Skin Protectants, Misc. (EUCERIN) cream   Topical   Apply 1 application topically 2 (two) times daily. Pt applies to arms and legs.         . traMADol (ULTRAM) 50 MG tablet   Oral   Take 50 mg by mouth 2 (two) times daily as needed for moderate pain.         Marland Kitchen triamcinolone cream (KENALOG) 0.1 %   Topical   Apply 1 application topically 2 (two) times daily as needed (for rash).          Marland Kitchen azithromycin (ZITHROMAX) 500 MG tablet   Oral   Take 1 tablet (500 mg total) by mouth daily. Patient not taking: Reported on 10/05/2014  4 tablet   0   . predniSONE (DELTASONE) 50 MG tablet      One table daily for 4 days Patient not taking: Reported on 10/05/2014   4 tablet   0     Allergies Review of patient's allergies indicates no known allergies.  No family history on file.  Social History Social History  Substance Use Topics  . Smoking status: Smoker, Current Status Unknown  . Smokeless tobacco: Not on file  . Alcohol Use: No    Review of Systems  Constitutional: Negative for fever. Eyes: Negative for visual changes. ENT: Negative for sore throat Cardiovascular: Negative for chest pain. Respiratory: Negative for shortness of breath. Gastrointestinal: Negative for abdominal pain, vomiting and  diarrhea. Genitourinary: Negative for dysuria. Musculoskeletal: Negative for back pain. Skin: Negative for rash. Neurological: Negative for headaches or focal weakness Psychiatric: No anxiety    ____________________________________________   PHYSICAL EXAM:  VITAL SIGNS: ED Triage Vitals  Enc Vitals Group     BP 10/05/14 1331 113/87 mmHg     Pulse Rate 10/05/14 1331 59     Resp --      Temp 10/05/14 1331 98.6 F (37 C)     Temp Source 10/05/14 1331 Oral     SpO2 10/05/14 1331 92 %     Weight --      Height --      Head Cir --      Peak Flow --      Pain Score --      Pain Loc --      Pain Edu? --      Excl. in GC? --      Constitutional:  Well appearing and in no distress. Eyes: Conjunctivae are normal.  ENT   Head: Normocephalic and atraumatic.   Mouth/Throat: Mucous membranes are moist. Cardiovascular: Normal rate, regular rhythm. Normal and symmetric distal pulses are present in all extremities. No murmurs, rubs, or gallops. Respiratory: Normal respiratory effort without tachypnea nor retractions. Breath sounds are clear and equal bilaterally.  Gastrointestinal: Soft and non-tender in all quadrants. No distention. There is no CVA tenderness. Genitourinary: deferred Musculoskeletal: Nontender with normal range of motion in all extremities. No lower extremity tenderness nor edema. Neurologic:  Normal speech and language. No gross focal neurologic deficits are appreciated. Skin:  Skin is warm, dry and intact. No rash noted. Psychiatric: Mood and affect are normal. Patient exhibits appropriate insight and judgment.  ____________________________________________    LABS (pertinent positives/negatives)  Labs Reviewed  CBC - Abnormal; Notable for the following:    WBC 12.9 (*)    Hemoglobin 12.1 (*)    HCT 37.2 (*)    RDW 16.7 (*)    Platelets 127 (*)    All other components within normal limits  COMPREHENSIVE METABOLIC PANEL - Abnormal; Notable for the  following:    Calcium 8.8 (*)    Albumin 3.4 (*)    ALT 13 (*)    GFR calc non Af Amer 58 (*)    All other components within normal limits  TROPONIN I    ____________________________________________   EKG  ED ECG REPORT I, Jene Every, the attending physician, personally viewed and interpreted this ECG.   Date: 10/05/2014  EKG Time: 1:34 PM  Rate: 64  Rhythm: atrial fibrillation, rate 64  Axis: Left axis deviation  Intervals:Atrial fibrillation  ST&T Change: Nonspecific   ____________________________________________    RADIOLOGY I have personally reviewed any xrays that were ordered on this patient:  None  ____________________________________________   PROCEDURES  Procedure(s) performed: none  Critical Care performed: none  ____________________________________________   INITIAL IMPRESSION / ASSESSMENT AND PLAN / ED COURSE  Pertinent labs & imaging results that were available during my care of the patient were reviewed by me and considered in my medical decision making (see chart for details).  Patient is very well-appearing. His EKG is nonischemic. His labs are benign. After receiving a GI cocktail he reports his pain is resolved and he is now asking for coffee. I believe he is safe for discharge he agrees to return if any change in his symptoms  ____________________________________________   FINAL CLINICAL IMPRESSION(S) / ED DIAGNOSES  Final diagnoses:  Chest pain, unspecified chest pain type     Jene Every, MD 10/05/14 1534

## 2014-10-05 NOTE — ED Notes (Signed)
Pt reports chest pain and abdominal pain starting this am

## 2014-10-22 DIAGNOSIS — I10 Essential (primary) hypertension: Secondary | ICD-10-CM | POA: Diagnosis not present

## 2014-10-22 DIAGNOSIS — R141 Gas pain: Secondary | ICD-10-CM | POA: Diagnosis not present

## 2014-10-22 DIAGNOSIS — R10817 Generalized abdominal tenderness: Secondary | ICD-10-CM | POA: Diagnosis not present

## 2014-10-22 DIAGNOSIS — Z9981 Dependence on supplemental oxygen: Secondary | ICD-10-CM | POA: Diagnosis not present

## 2014-10-22 DIAGNOSIS — I499 Cardiac arrhythmia, unspecified: Secondary | ICD-10-CM | POA: Diagnosis not present

## 2014-11-04 DIAGNOSIS — J449 Chronic obstructive pulmonary disease, unspecified: Secondary | ICD-10-CM | POA: Diagnosis not present

## 2014-11-14 DIAGNOSIS — R10817 Generalized abdominal tenderness: Secondary | ICD-10-CM | POA: Diagnosis not present

## 2014-11-14 DIAGNOSIS — R141 Gas pain: Secondary | ICD-10-CM | POA: Diagnosis not present

## 2014-11-19 ENCOUNTER — Ambulatory Visit
Admission: RE | Admit: 2014-11-19 | Discharge: 2014-11-19 | Disposition: A | Discharge status: Home / Self Care | Payer: Medicare Other | Source: Ambulatory Visit | Attending: Internal Medicine | Admitting: Internal Medicine

## 2014-11-19 ENCOUNTER — Other Ambulatory Visit: Payer: Self-pay | Admitting: Internal Medicine

## 2014-11-19 DIAGNOSIS — J449 Chronic obstructive pulmonary disease, unspecified: Secondary | ICD-10-CM | POA: Insufficient documentation

## 2014-11-19 DIAGNOSIS — R0602 Shortness of breath: Secondary | ICD-10-CM | POA: Diagnosis not present

## 2014-11-19 DIAGNOSIS — J90 Pleural effusion, not elsewhere classified: Secondary | ICD-10-CM | POA: Diagnosis not present

## 2014-11-19 DIAGNOSIS — K8020 Calculus of gallbladder without cholecystitis without obstruction: Secondary | ICD-10-CM | POA: Diagnosis not present

## 2014-11-19 DIAGNOSIS — N1339 Other hydronephrosis: Secondary | ICD-10-CM | POA: Diagnosis not present

## 2014-11-19 DIAGNOSIS — I517 Cardiomegaly: Secondary | ICD-10-CM | POA: Insufficient documentation

## 2014-11-19 DIAGNOSIS — Z9981 Dependence on supplemental oxygen: Secondary | ICD-10-CM | POA: Diagnosis not present

## 2014-11-20 ENCOUNTER — Encounter: Payer: Self-pay | Admitting: *Deleted

## 2014-11-28 ENCOUNTER — Encounter: Payer: Self-pay | Admitting: General Surgery

## 2014-11-28 ENCOUNTER — Ambulatory Visit (INDEPENDENT_AMBULATORY_CARE_PROVIDER_SITE_OTHER): Payer: Medicare Other | Admitting: General Surgery

## 2014-11-28 VITALS — BP 120/60 | HR 64 | Resp 16

## 2014-11-28 DIAGNOSIS — K8020 Calculus of gallbladder without cholecystitis without obstruction: Secondary | ICD-10-CM | POA: Diagnosis not present

## 2014-11-28 NOTE — Patient Instructions (Signed)
The patient is aware to call back for any questions or concerns.  

## 2014-11-28 NOTE — Progress Notes (Addendum)
Patient ID: Darrell Mcconnell, male   DOB: 05-24-1935, 79 y.o.   MRN: [id number]  Chief Complaint  Patient presents with  . Other    gallstones    HPI Darrell Mcconnell is a 79 y.o. male here today for a evaluation of gallstones. Patient was seen by in May 2016. Patient had a chest x-ray perform on 11/19/14. He denies pain.  HPI  Past Medical History  Diagnosis Date  . CHF (congestive heart failure) (HCC)   . COPD (chronic obstructive pulmonary disease) (HCC)   . Aorta aneurysm (HCC)   . Asthma   . Hypertension     History reviewed. No pertinent past surgical history.  History reviewed. No pertinent family history.  Social History Social History  Substance Use Topics  . Smoking status: Former Games developer  . Smokeless tobacco: None  . Alcohol Use: No    No Known Allergies  Current Outpatient Prescriptions  Medication Sig Dispense Refill  . acetaminophen (TYLENOL) 650 MG CR tablet Take 650 mg by mouth 2 (two) times daily as needed for pain.    Marland Kitchen albuterol (PROVENTIL HFA;VENTOLIN HFA) 108 (90 BASE) MCG/ACT inhaler Inhale 1 puff into the lungs 4 (four) times daily as needed for wheezing or shortness of breath.    . ALPRAZolam (XANAX) 0.25 MG tablet Take 0.25-0.5 mg by mouth 3 (three) times daily as needed for anxiety.     Marland Kitchen alum & mag hydroxide-simeth (MAALOX PLUS) 400-400-40 MG/5ML suspension Take 30 mLs by mouth every 6 (six) hours as needed for indigestion.    Marland Kitchen aspirin EC 81 MG tablet Take 81 mg by mouth every evening.     . bimatoprost (LUMIGAN) 0.01 % SOLN Place 1 drop into both eyes every evening.     . brimonidine-timolol (COMBIGAN) 0.2-0.5 % ophthalmic solution Place 1 drop into both eyes 2 (two) times daily.    . cholecalciferol (VITAMIN D) 400 UNITS TABS tablet Take 400 Units by mouth daily.    Marland Kitchen dicyclomine (BENTYL) 20 MG tablet Take 20 mg by mouth every 8 (eight) hours as needed (for diarrhea).     . digoxin (LANOXIN) 0.125 MG tablet Take 0.125 mg by mouth daily.    Marland Kitchen docusate  sodium (COLACE) 100 MG capsule Take 100 mg by mouth 2 (two) times daily.    . dorzolamide (TRUSOPT) 2 % ophthalmic solution Place 1 drop into both eyes 2 (two) times daily.    Marland Kitchen escitalopram (LEXAPRO) 10 MG tablet Take 15 mg by mouth daily.    . finasteride (PROSCAR) 5 MG tablet Take 5 mg by mouth daily.    . furosemide (LASIX) 40 MG tablet Take 40 mg by mouth daily.    . hydrocortisone cream 1 % Apply 1 application topically as needed for itching (and rash).     Marland Kitchen ipratropium-albuterol (DUONEB) 0.5-2.5 (3) MG/3ML SOLN Take 3 mLs by nebulization 3 (three) times daily as needed (for shortness of breath/wheezing).     Marland Kitchen levothyroxine (SYNTHROID, LEVOTHROID) 25 MCG tablet Take 25 mcg by mouth daily before breakfast.    . lisinopril (PRINIVIL,ZESTRIL) 2.5 MG tablet Take 2.5 mg by mouth daily.    . Melatonin 3 MG TABS Take 1 tablet by mouth at bedtime.    . meloxicam (MOBIC) 7.5 MG tablet Take 7.5 mg by mouth daily.    . metoprolol tartrate (LOPRESSOR) 25 MG tablet Take 12.5 mg by mouth daily.     . mirtazapine (REMERON) 30 MG tablet Take 30 mg by mouth at bedtime.    Marland Kitchen  ondansetron (ZOFRAN) 8 MG tablet Take 8 mg by mouth every 6 (six) hours as needed for nausea or vomiting.    . OXYGEN Inhale 2 L into the lungs daily.    . pantoprazole (PROTONIX) 40 MG tablet Take 40 mg by mouth daily.    . polyethylene glycol (MIRALAX / GLYCOLAX) packet Take 17 g by mouth daily as needed for moderate constipation.    . predniSONE (DELTASONE) 50 MG tablet One table daily for 4 days 4 tablet 0  . Prenatal Vit-Fe Fumarate-FA (PREPLUS) 27-1 MG TABS Take 1 tablet by mouth daily.    . QUEtiapine (SEROQUEL) 25 MG tablet Take 25 mg by mouth 3 (three) times daily.    . QUEtiapine (SEROQUEL) 50 MG tablet Take 50 mg by mouth at bedtime.    . simethicone (MYLICON) 80 MG chewable tablet Chew 80 mg by mouth every 4 (four) hours as needed for flatulence.    . Skin Protectants, Misc. (EUCERIN) cream Apply 1 application topically 2  (two) times daily. Pt applies to arms and legs.    . traMADol (ULTRAM) 50 MG tablet Take 50 mg by mouth 2 (two) times daily as needed for moderate pain.    Marland Kitchen triamcinolone cream (KENALOG) 0.1 % Apply 1 application topically 2 (two) times daily as needed (for rash).     Marland Kitchen azithromycin (ZITHROMAX) 500 MG tablet Take 1 tablet (500 mg total) by mouth daily. (Patient not taking: Reported on 10/05/2014) 4 tablet 0   No current facility-administered medications for this visit.    Review of Systems Review of Systems  Constitutional: Negative.   Respiratory: Negative.   Cardiovascular: Negative.   Gastrointestinal: Negative.     Blood pressure 120/60, pulse 64, resp. rate 16.  Physical Exam Physical Exam  Constitutional: He is oriented to person, place, and time. He appears well-developed and well-nourished.  HENT:  Mouth/Throat: Oropharynx is clear and moist.  Eyes: Conjunctivae are normal. No scleral icterus.  Neck: Neck supple.  Cardiovascular: Normal rate, regular rhythm and normal heart sounds.   Pulmonary/Chest: Effort normal and breath sounds normal.  Abdominal: Soft. There is no tenderness.  Lymphadenopathy:    He has no cervical adenopathy.  Neurological: He is alert and oriented to person, place, and time.  Skin: Skin is warm and dry.  Psychiatric: His behavior is normal.    Data Reviewed Progress notes, Ultrasound, and CT scan.  Assessment    Gall stones. Asymptomatic. No indication for surgical intervention    Plan    Observation for now. The patient is aware to call back for any questions or concerns.       PCP:  Roberts Gaudy G 12/27/2014, 9:08 AM

## 2014-11-29 ENCOUNTER — Encounter: Payer: Self-pay | Admitting: General Surgery

## 2014-12-04 DIAGNOSIS — J449 Chronic obstructive pulmonary disease, unspecified: Secondary | ICD-10-CM | POA: Diagnosis not present

## 2014-12-05 DIAGNOSIS — F331 Major depressive disorder, recurrent, moderate: Secondary | ICD-10-CM | POA: Diagnosis not present

## 2014-12-06 NOTE — Patient Outreach (Signed)
[facility]) Care Management  12/06/2014  Darrell StandardClyde Mcconnell 1935-03-26 [id number]   Referral from [facility], assigned George Inaavina Green, RN to outreach for [facility] Care [facility] services.  Thanks, Corrie MckusickLisa O. Sharlee BlewMoore, AABA [facility] Care Management [facility] CM Assistant Phone: [phone] Fax: [phone]

## 2014-12-07 ENCOUNTER — Other Ambulatory Visit: Payer: Self-pay

## 2014-12-07 NOTE — Patient Outreach (Signed)
[facility]) Care Management  12/07/2014  Revonda StandardClyde Tarleton September 19, 1935 [id number]  Subjective:   Telephone call to patient.  Patient requested that RNCM speak to [name] ( caregiver) regarding his medical information.     RNCM informed that Ms. McCormick would return call.     Plan:  RNCM will await call from caregive.   If no return call RNCM will attempt 2nd telephone outreach to patient/ caregiver within 3 business days.  George InaDavina Malini Flemings RN,BSN,CCM [facility] Telephonic Care Coordinator [phone]

## 2014-12-10 ENCOUNTER — Other Ambulatory Visit: Payer: Self-pay

## 2014-12-10 ENCOUNTER — Other Ambulatory Visit

## 2014-12-10 NOTE — Patient Outreach (Signed)
[facility]) Care Management  12/10/2014  Darrell Mcconnell 10/14/1935 [id number]   Telephone call to patient's caregiver[facility] [name].   Staff answering phone states Ms. McCormick was not available.    Plan: RNCM will attempt 3rd telephone outreach within 3 business days.  [name] RN,BSN,CCM [facility] Telephonic Care Coordinator [phone]

## 2014-12-10 NOTE — Patient Outreach (Signed)
[facility]) Care Management  12/10/2014  Revonda StandardClyde Mcconnell 11-08-1935 [id number]   Subjective: Telephone call from patient's caregiver Darrell Mcconnell.   HIPAA verified by Ms. Mcconnell for patient.   Discussed and offered [facility] Care [facility] services.    Caregiver declined services for patient at this time.     Assessment: [employer] Risk Analysis Referral.    Plan:  RNCM will notify  Darrell Mcconnell to close patient's case due to refusal of [facility] Care [facility] services.   RNCM will notify patient's primary MD regarding refusal [facility] services.   Darrell InaDavina Johannah Rozas RN,BSN,CCM [facility] Telephonic Care Coordinator [phone]

## 2014-12-10 NOTE — Patient Outreach (Signed)
[facility]) Care Management  12/10/2014  Darrell StandardClyde Mcconnell Sep 20, 1935 [id number]   Notification from George Inaavina Green, RN to close case due to patient refused [facility] services.  Thanks, Darrell MckusickLisa O. Darrell Mcconnell, AABA [facility] Care Management [facility] CM Assistant Phone: [phone] Fax: [phone]

## 2014-12-11 ENCOUNTER — Ambulatory Visit

## 2014-12-25 DIAGNOSIS — J9611 Chronic respiratory failure with hypoxia: Secondary | ICD-10-CM | POA: Diagnosis not present

## 2014-12-25 DIAGNOSIS — J449 Chronic obstructive pulmonary disease, unspecified: Secondary | ICD-10-CM | POA: Diagnosis not present

## 2014-12-25 DIAGNOSIS — N1339 Other hydronephrosis: Secondary | ICD-10-CM | POA: Diagnosis not present

## 2014-12-25 DIAGNOSIS — R0602 Shortness of breath: Secondary | ICD-10-CM | POA: Diagnosis not present

## 2014-12-25 DIAGNOSIS — J90 Pleural effusion, not elsewhere classified: Secondary | ICD-10-CM | POA: Diagnosis not present

## 2015-01-04 ENCOUNTER — Inpatient Hospital Stay
Admission: EM | Admit: 2015-01-04 | Discharge: 2015-01-09 | DRG: 194 | Disposition: A | Discharge status: Skilled Nursing Facility | Payer: Medicare Other | Attending: Internal Medicine | Admitting: Internal Medicine

## 2015-01-04 ENCOUNTER — Emergency Department: Payer: Medicare Other

## 2015-01-04 DIAGNOSIS — N400 Enlarged prostate without lower urinary tract symptoms: Secondary | ICD-10-CM | POA: Diagnosis present

## 2015-01-04 DIAGNOSIS — E785 Hyperlipidemia, unspecified: Secondary | ICD-10-CM | POA: Diagnosis not present

## 2015-01-04 DIAGNOSIS — R45851 Suicidal ideations: Secondary | ICD-10-CM | POA: Diagnosis present

## 2015-01-04 DIAGNOSIS — R109 Unspecified abdominal pain: Secondary | ICD-10-CM | POA: Diagnosis not present

## 2015-01-04 DIAGNOSIS — J189 Pneumonia, unspecified organism: Secondary | ICD-10-CM | POA: Diagnosis not present

## 2015-01-04 DIAGNOSIS — K219 Gastro-esophageal reflux disease without esophagitis: Secondary | ICD-10-CM | POA: Diagnosis not present

## 2015-01-04 DIAGNOSIS — Z8679 Personal history of other diseases of the circulatory system: Secondary | ICD-10-CM | POA: Diagnosis not present

## 2015-01-04 DIAGNOSIS — J449 Chronic obstructive pulmonary disease, unspecified: Secondary | ICD-10-CM | POA: Diagnosis present

## 2015-01-04 DIAGNOSIS — R509 Fever, unspecified: Secondary | ICD-10-CM | POA: Diagnosis not present

## 2015-01-04 DIAGNOSIS — I5022 Chronic systolic (congestive) heart failure: Secondary | ICD-10-CM | POA: Diagnosis not present

## 2015-01-04 DIAGNOSIS — R0602 Shortness of breath: Secondary | ICD-10-CM

## 2015-01-04 DIAGNOSIS — F331 Major depressive disorder, recurrent, moderate: Secondary | ICD-10-CM | POA: Diagnosis present

## 2015-01-04 DIAGNOSIS — J45909 Unspecified asthma, uncomplicated: Secondary | ICD-10-CM | POA: Diagnosis not present

## 2015-01-04 DIAGNOSIS — F419 Anxiety disorder, unspecified: Secondary | ICD-10-CM | POA: Diagnosis present

## 2015-01-04 DIAGNOSIS — G4700 Insomnia, unspecified: Secondary | ICD-10-CM | POA: Diagnosis not present

## 2015-01-04 DIAGNOSIS — Z87891 Personal history of nicotine dependence: Secondary | ICD-10-CM | POA: Diagnosis not present

## 2015-01-04 DIAGNOSIS — I482 Chronic atrial fibrillation, unspecified: Secondary | ICD-10-CM | POA: Diagnosis present

## 2015-01-04 DIAGNOSIS — Z8711 Personal history of peptic ulcer disease: Secondary | ICD-10-CM | POA: Diagnosis not present

## 2015-01-04 DIAGNOSIS — Y95 Nosocomial condition: Secondary | ICD-10-CM | POA: Diagnosis present

## 2015-01-04 DIAGNOSIS — F0280 Dementia in other diseases classified elsewhere without behavioral disturbance: Secondary | ICD-10-CM | POA: Diagnosis not present

## 2015-01-04 DIAGNOSIS — I110 Hypertensive heart disease with heart failure: Secondary | ICD-10-CM | POA: Diagnosis present

## 2015-01-04 DIAGNOSIS — F0390 Unspecified dementia without behavioral disturbance: Secondary | ICD-10-CM | POA: Diagnosis present

## 2015-01-04 DIAGNOSIS — R222 Localized swelling, mass and lump, trunk: Secondary | ICD-10-CM

## 2015-01-04 DIAGNOSIS — E039 Hypothyroidism, unspecified: Secondary | ICD-10-CM | POA: Diagnosis present

## 2015-01-04 DIAGNOSIS — H409 Unspecified glaucoma: Secondary | ICD-10-CM | POA: Diagnosis present

## 2015-01-04 DIAGNOSIS — G309 Alzheimer's disease, unspecified: Secondary | ICD-10-CM | POA: Diagnosis present

## 2015-01-04 DIAGNOSIS — I2510 Atherosclerotic heart disease of native coronary artery without angina pectoris: Secondary | ICD-10-CM | POA: Diagnosis not present

## 2015-01-04 DIAGNOSIS — Z8249 Family history of ischemic heart disease and other diseases of the circulatory system: Secondary | ICD-10-CM | POA: Diagnosis not present

## 2015-01-04 DIAGNOSIS — R1084 Generalized abdominal pain: Secondary | ICD-10-CM | POA: Diagnosis not present

## 2015-01-04 DIAGNOSIS — I10 Essential (primary) hypertension: Secondary | ICD-10-CM | POA: Diagnosis not present

## 2015-01-04 DIAGNOSIS — Z951 Presence of aortocoronary bypass graft: Secondary | ICD-10-CM

## 2015-01-04 DIAGNOSIS — Z8673 Personal history of transient ischemic attack (TIA), and cerebral infarction without residual deficits: Secondary | ICD-10-CM | POA: Diagnosis not present

## 2015-01-04 DIAGNOSIS — Z66 Do not resuscitate: Secondary | ICD-10-CM | POA: Diagnosis not present

## 2015-01-04 DIAGNOSIS — M6281 Muscle weakness (generalized): Secondary | ICD-10-CM | POA: Diagnosis not present

## 2015-01-04 HISTORY — DX: Unspecified dementia, unspecified severity, without behavioral disturbance, psychotic disturbance, mood disturbance, and anxiety: F03.90

## 2015-01-04 HISTORY — DX: Gastro-esophageal reflux disease without esophagitis: K21.9

## 2015-01-04 HISTORY — DX: Depression, unspecified: F32.A

## 2015-01-04 HISTORY — DX: Major depressive disorder, single episode, unspecified: F32.9

## 2015-01-04 HISTORY — DX: Hypothyroidism, unspecified: E03.9

## 2015-01-04 HISTORY — DX: Anxiety disorder, unspecified: F41.9

## 2015-01-04 HISTORY — DX: Chronic systolic (congestive) heart failure: I50.22

## 2015-01-04 HISTORY — DX: Unspecified atrial fibrillation: I48.91

## 2015-01-04 HISTORY — DX: Benign prostatic hyperplasia without lower urinary tract symptoms: N40.0

## 2015-01-04 LAB — COMPREHENSIVE METABOLIC PANEL
ALT: 13 U/L — AB (ref 17–63)
ANION GAP: 5 (ref 5–15)
AST: 20 U/L (ref 15–41)
Albumin: 3.2 g/dL — ABNORMAL LOW (ref 3.5–5.0)
Alkaline Phosphatase: 79 U/L (ref 38–126)
BUN: 17 mg/dL (ref 6–20)
CHLORIDE: 104 mmol/L (ref 101–111)
CO2: 32 mmol/L (ref 22–32)
Calcium: 8.8 mg/dL — ABNORMAL LOW (ref 8.9–10.3)
Creatinine, Ser: 0.97 mg/dL (ref 0.61–1.24)
GFR calc Af Amer: >60 mL/min (ref >60)
Glucose, Bld: 116 mg/dL — ABNORMAL HIGH (ref 65–99)
Potassium: 3.6 mmol/L (ref 3.5–5.1)
SODIUM: 141 mmol/L (ref 135–145)
Total Bilirubin: 0.1 mg/dL — ABNORMAL LOW (ref 0.3–1.2)
Total Protein: 7 g/dL (ref 6.5–8.1)

## 2015-01-04 LAB — LIPASE, BLOOD: Lipase: 32 U/L (ref 11–51)

## 2015-01-04 LAB — DIGOXIN LEVEL: DIGOXIN LVL: 0.4 ng/mL — AB (ref 0.8–2.0)

## 2015-01-04 LAB — CBC WITH DIFFERENTIAL/PLATELET
BASOS ABS: 0 10*3/uL (ref 0–0.1)
BASOS PCT: 1 %
EOS ABS: 0.4 10*3/uL (ref 0–0.7)
Eosinophils Relative: 5 %
HCT: 36.8 % — ABNORMAL LOW (ref 40.0–52.0)
Hemoglobin: 12.3 g/dL — ABNORMAL LOW (ref 13.0–18.0)
Lymphocytes Relative: 16 %
Lymphs Abs: 1.3 10*3/uL (ref 1.0–3.6)
MCH: 27.9 pg (ref 26.0–34.0)
MCHC: 33.3 g/dL (ref 32.0–36.0)
MCV: 83.7 fL (ref 80.0–100.0)
MONO ABS: 0.5 10*3/uL (ref 0.2–1.0)
Monocytes Relative: 7 %
Neutro Abs: 5.6 10*3/uL (ref 1.4–6.5)
Neutrophils Relative %: 71 %
PLATELETS: 160 10*3/uL (ref 150–440)
RBC: 4.4 MIL/uL (ref 4.40–5.90)
RDW: 16.6 % — AB (ref 11.5–14.5)
WBC: 7.8 10*3/uL (ref 3.8–10.6)

## 2015-01-04 LAB — TROPONIN I: Troponin I: <0.03 ng/mL (ref <0.031)

## 2015-01-04 LAB — APTT: APTT: 36 s (ref 24–36)

## 2015-01-04 LAB — PROTIME-INR
INR: 1.1
Prothrombin Time: 14.4 seconds (ref 11.4–15.0)

## 2015-01-04 MED ORDER — ACETAMINOPHEN 650 MG RE SUPP
650.0000 mg | Freq: Four times a day (QID) | RECTAL | Status: DC | PRN
Start: 2015-01-04 — End: 2015-01-09

## 2015-01-04 MED ORDER — ALPRAZOLAM 0.25 MG PO TABS
0.2500 mg | ORAL_TABLET | Freq: Three times a day (TID) | ORAL | Status: DC | PRN
  Administered 2015-01-04 – 2015-01-06 (×5): 0.25 mg via ORAL
  Filled 2015-01-04 (×5): qty 1

## 2015-01-04 MED ORDER — TIMOLOL MALEATE 0.5 % OP SOLN
1.0000 [drp] | Freq: Two times a day (BID) | OPHTHALMIC | Status: DC
  Administered 2015-01-04 – 2015-01-09 (×10): 1 [drp] via OPHTHALMIC
  Filled 2015-01-04: qty 5

## 2015-01-04 MED ORDER — DEXTROSE 5 % IV SOLN
500.0000 mg | Freq: Once | INTRAVENOUS | Status: AC
  Administered 2015-01-04: 500 mg via INTRAVENOUS
  Filled 2015-01-04: qty 500

## 2015-01-04 MED ORDER — ASPIRIN EC 81 MG PO TBEC
81.0000 mg | DELAYED_RELEASE_TABLET | Freq: Every evening | ORAL | Status: DC
  Administered 2015-01-05 – 2015-01-08 (×4): 81 mg via ORAL
  Filled 2015-01-04 (×4): qty 1

## 2015-01-04 MED ORDER — VANCOMYCIN HCL IN DEXTROSE 750-5 MG/150ML-% IV SOLN
750.0000 mg | Freq: Two times a day (BID) | INTRAVENOUS | Status: DC
  Administered 2015-01-05 – 2015-01-06 (×4): 750 mg via INTRAVENOUS
  Filled 2015-01-04 (×5): qty 150

## 2015-01-04 MED ORDER — ACETAMINOPHEN 325 MG PO TABS
650.0000 mg | ORAL_TABLET | Freq: Once | ORAL | Status: AC
  Administered 2015-01-04: 650 mg via ORAL
  Filled 2015-01-04: qty 2

## 2015-01-04 MED ORDER — DIGOXIN 125 MCG PO TABS
0.1250 mg | ORAL_TABLET | Freq: Every day | ORAL | Status: DC
  Administered 2015-01-05 – 2015-01-09 (×5): 0.125 mg via ORAL
  Filled 2015-01-04 (×5): qty 1

## 2015-01-04 MED ORDER — DEXTROSE 5 % IV SOLN
1.0000 g | Freq: Once | INTRAVENOUS | Status: AC
  Administered 2015-01-04: 1 g via INTRAVENOUS
  Filled 2015-01-04: qty 10

## 2015-01-04 MED ORDER — LEVOTHYROXINE SODIUM 50 MCG PO TABS
25.0000 ug | ORAL_TABLET | Freq: Every day | ORAL | Status: DC
  Administered 2015-01-05 – 2015-01-09 (×5): 25 ug via ORAL
  Filled 2015-01-04 (×5): qty 1

## 2015-01-04 MED ORDER — ENOXAPARIN SODIUM 40 MG/0.4ML ~~LOC~~ SOLN
40.0000 mg | SUBCUTANEOUS | Status: DC
  Administered 2015-01-04 – 2015-01-08 (×5): 40 mg via SUBCUTANEOUS
  Filled 2015-01-04 (×6): qty 0.4

## 2015-01-04 MED ORDER — ESCITALOPRAM OXALATE 10 MG PO TABS
15.0000 mg | ORAL_TABLET | Freq: Every day | ORAL | Status: DC
  Administered 2015-01-05 – 2015-01-09 (×5): 15 mg via ORAL
  Filled 2015-01-04 (×3): qty 2
  Filled 2015-01-04: qty 1
  Filled 2015-01-04: qty 2
  Filled 2015-01-04: qty 1

## 2015-01-04 MED ORDER — SENNOSIDES-DOCUSATE SODIUM 8.6-50 MG PO TABS: 1.0000 | ORAL_TABLET | Freq: Every evening | ORAL | Status: DC | PRN

## 2015-01-04 MED ORDER — METOPROLOL TARTRATE 25 MG PO TABS
12.5000 mg | ORAL_TABLET | Freq: Every day | ORAL | Status: DC
  Administered 2015-01-05 – 2015-01-07 (×3): 12.5 mg via ORAL
  Filled 2015-01-04: qty 2
  Filled 2015-01-04 (×2): qty 1

## 2015-01-04 MED ORDER — PANTOPRAZOLE SODIUM 40 MG PO TBEC
40.0000 mg | DELAYED_RELEASE_TABLET | Freq: Every day | ORAL | Status: DC
  Administered 2015-01-05 – 2015-01-09 (×5): 40 mg via ORAL
  Filled 2015-01-04 (×5): qty 1

## 2015-01-04 MED ORDER — ACETAMINOPHEN 325 MG PO TABS
650.0000 mg | ORAL_TABLET | Freq: Four times a day (QID) | ORAL | Status: DC | PRN
  Administered 2015-01-05 – 2015-01-08 (×6): 650 mg via ORAL
  Filled 2015-01-04 (×6): qty 2

## 2015-01-04 MED ORDER — SODIUM CHLORIDE 0.9 % IV SOLN
INTRAVENOUS | Status: AC
  Administered 2015-01-04: 23:00:00 via INTRAVENOUS

## 2015-01-04 MED ORDER — IPRATROPIUM-ALBUTEROL 0.5-2.5 (3) MG/3ML IN SOLN
3.0000 mL | RESPIRATORY_TRACT | Status: DC | PRN
  Administered 2015-01-05 – 2015-01-09 (×16): 3 mL via RESPIRATORY_TRACT
  Filled 2015-01-04 (×18): qty 3

## 2015-01-04 MED ORDER — PIPERACILLIN-TAZOBACTAM 3.375 G IVPB
3.3750 g | Freq: Three times a day (TID) | INTRAVENOUS | Status: DC
  Administered 2015-01-05 – 2015-01-08 (×12): 3.375 g via INTRAVENOUS
  Filled 2015-01-04 (×16): qty 50

## 2015-01-04 MED ORDER — ONDANSETRON HCL 4 MG PO TABS: 4.0000 mg | ORAL_TABLET | Freq: Four times a day (QID) | ORAL | Status: DC | PRN

## 2015-01-04 MED ORDER — FINASTERIDE 5 MG PO TABS
5.0000 mg | ORAL_TABLET | Freq: Every day | ORAL | Status: DC
  Administered 2015-01-05 – 2015-01-09 (×5): 5 mg via ORAL
  Filled 2015-01-04 (×5): qty 1

## 2015-01-04 MED ORDER — BRIMONIDINE TARTRATE-TIMOLOL 0.2-0.5 % OP SOLN: 1.0000 [drp] | Freq: Two times a day (BID) | OPHTHALMIC | Status: DC

## 2015-01-04 MED ORDER — ONDANSETRON HCL 4 MG/2ML IJ SOLN
4.0000 mg | Freq: Four times a day (QID) | INTRAMUSCULAR | Status: DC | PRN
  Administered 2015-01-09: 4 mg via INTRAVENOUS
  Filled 2015-01-04: qty 2

## 2015-01-04 MED ORDER — FUROSEMIDE 40 MG PO TABS
40.0000 mg | ORAL_TABLET | Freq: Every day | ORAL | Status: DC
  Administered 2015-01-05 – 2015-01-09 (×5): 40 mg via ORAL
  Filled 2015-01-04 (×5): qty 1

## 2015-01-04 MED ORDER — SODIUM CHLORIDE 0.9 % IJ SOLN
3.0000 mL | Freq: Two times a day (BID) | INTRAMUSCULAR | Status: DC
  Administered 2015-01-05 – 2015-01-09 (×9): 3 mL via INTRAVENOUS

## 2015-01-04 MED ORDER — VANCOMYCIN HCL IN DEXTROSE 750-5 MG/150ML-% IV SOLN
750.0000 mg | Freq: Once | INTRAVENOUS | Status: AC
  Administered 2015-01-04: 750 mg via INTRAVENOUS
  Filled 2015-01-04: qty 150

## 2015-01-04 MED ORDER — IPRATROPIUM-ALBUTEROL 0.5-2.5 (3) MG/3ML IN SOLN
3.0000 mL | Freq: Once | RESPIRATORY_TRACT | Status: AC
  Administered 2015-01-04: 3 mL via RESPIRATORY_TRACT
  Filled 2015-01-04: qty 3

## 2015-01-04 MED ORDER — DORZOLAMIDE HCL 2 % OP SOLN
1.0000 [drp] | Freq: Two times a day (BID) | OPHTHALMIC | Status: DC
  Administered 2015-01-04 – 2015-01-09 (×9): 1 [drp] via OPHTHALMIC
  Filled 2015-01-04: qty 10

## 2015-01-04 MED ORDER — BRIMONIDINE TARTRATE 0.2 % OP SOLN
1.0000 [drp] | Freq: Two times a day (BID) | OPHTHALMIC | Status: DC
  Administered 2015-01-04 – 2015-01-09 (×10): 1 [drp] via OPHTHALMIC
  Filled 2015-01-04: qty 5

## 2015-01-04 NOTE — H&P (Signed)
[facility] - [place] at [facility]   PATIENT NAME: Darrell Mcconnell    MR#:  [id number]  DATE OF BIRTH:  Jun 20, 1935  DATE OF ADMISSION:  01/04/2015  PRIMARY CARE PHYSICIAN: Lyndon Code, MD   REQUESTING/REFERRING PHYSICIAN: Inocencio Homes, M.D.  CHIEF COMPLAINT:   Chief Complaint  Patient presents with  . Shortness of Breath    HISTORY OF PRESENT ILLNESS:  Darrell Mcconnell  is a 79 y.o. male who presents with redness of breath. Patient states that he has had increasing shortness of breath over the last day and a half. He has COPD at baseline, but states that this shortness of breath has been accompanied by right lower pleuritic pain with deep breathing. He states that he is also had some fever and chills at home. On evaluation here in the ED his white count is normal, but chest x-ray shows a right lower lobe effusion which could be consistent with pneumonia. He was given antibiotics in the ED and hospitalists were called for admission.  PAST MEDICAL HISTORY:   Past Medical History  Diagnosis Date  . Chronic systolic CHF (congestive heart failure) (HCC)   . COPD (chronic obstructive pulmonary disease) (HCC)   . Aorta aneurysm (HCC)   . Asthma   . Hypertension   . Anxiety   . A-fib (HCC)   . Depression   . Hypothyroidism   . GERD (gastroesophageal reflux disease)   . Dementia   . BPH (benign prostatic hyperplasia)     PAST SURGICAL HISTORY:   Past Surgical History  Procedure Laterality Date  . Coronary artery bypass graft    . Prostate surgery    . Abdominal aortic aneurysm repair      SOCIAL HISTORY:   Social History  Substance Use Topics  . Smoking status: Former Games developer  . Smokeless tobacco: Not on file  . Alcohol Use: No    FAMILY HISTORY:   Family History  Problem Relation Age of Onset  . CAD Father     DRUG ALLERGIES:  No Known Allergies  MEDICATIONS AT HOME:   Prior to Admission medications   Medication Sig Start Date End Date Taking?  Authorizing Provider  ALPRAZolam (XANAX) 0.25 MG tablet Take 0.25 mg by mouth every 8 (eight) hours as needed for anxiety.    Yes Historical Provider, MD  aspirin EC 81 MG tablet Take 81 mg by mouth every evening.    Yes Historical Provider, MD  bimatoprost (LUMIGAN) 0.01 % SOLN Place 1 drop into both eyes at bedtime.    Yes Historical Provider, MD  brimonidine-timolol (COMBIGAN) 0.2-0.5 % ophthalmic solution Place 1 drop into both eyes 2 (two) times daily.   Yes Historical Provider, MD  cholecalciferol (VITAMIN D) 400 UNITS TABS tablet Take 400 Units by mouth daily.   Yes Historical Provider, MD  digoxin (LANOXIN) 0.125 MG tablet Take 0.125 mg by mouth daily.   Yes Historical Provider, MD  docusate sodium (COLACE) 100 MG capsule Take 100 mg by mouth 2 (two) times daily.   Yes Historical Provider, MD  dorzolamide (TRUSOPT) 2 % ophthalmic solution Place 1 drop into both eyes 2 (two) times daily.   Yes Historical Provider, MD  escitalopram (LEXAPRO) 10 MG tablet Take 15 mg by mouth daily.   Yes Historical Provider, MD  finasteride (PROSCAR) 5 MG tablet Take 5 mg by mouth daily.   Yes Historical Provider, MD  furosemide (LASIX) 40 MG tablet Take 40 mg by mouth daily.   Yes  Historical Provider, MD  hydrocortisone cream 1 % Apply 1 application topically as needed for itching (and rash).    Yes Historical Provider, MD  ipratropium-albuterol (DUONEB) 0.5-2.5 (3) MG/3ML SOLN Take 3 mLs by nebulization 3 (three) times daily as needed (for shortness of breath/wheezing).    Yes Historical Provider, MD  levothyroxine (SYNTHROID, LEVOTHROID) 25 MCG tablet Take 25 mcg by mouth daily.    Yes Historical Provider, MD  lisinopril (PRINIVIL,ZESTRIL) 2.5 MG tablet Take 2.5 mg by mouth daily.   Yes Historical Provider, MD  Melatonin 3 MG TABS Take 3 mg by mouth at bedtime.    Yes Historical Provider, MD  meloxicam (MOBIC) 7.5 MG tablet Take 7.5 mg by mouth daily.   Yes Historical Provider, MD  metoCLOPramide (REGLAN)  10 MG tablet Take 10 mg by mouth 3 (three) times daily before meals.   Yes Historical Provider, MD  metoprolol tartrate (LOPRESSOR) 25 MG tablet Take 12.5 mg by mouth daily.    Yes Historical Provider, MD  mirtazapine (REMERON) 30 MG tablet Take 30 mg by mouth at bedtime.   Yes Historical Provider, MD  ondansetron (ZOFRAN) 4 MG tablet Take 4 mg by mouth 2 (two) times daily as needed for nausea or vomiting.   Yes Historical Provider, MD  pantoprazole (PROTONIX) 40 MG tablet Take 40 mg by mouth daily.   Yes Historical Provider, MD  polyethylene glycol (MIRALAX / GLYCOLAX) packet Take 17 g by mouth daily as needed for moderate constipation.   Yes Historical Provider, MD  Prenatal Vit-Fe Fumarate-FA (PREPLUS) 27-1 MG TABS Take 1 tablet by mouth daily.   Yes Historical Provider, MD  QUEtiapine (SEROQUEL) 25 MG tablet Take 25 mg by mouth 3 (three) times daily.   Yes Historical Provider, MD  QUEtiapine (SEROQUEL) 50 MG tablet Take 50 mg by mouth at bedtime.   Yes Historical Provider, MD  simethicone (MYLICON) 80 MG chewable tablet Chew 80 mg by mouth every 4 (four) hours as needed for flatulence.   Yes Historical Provider, MD  Skin Protectants, Misc. (EUCERIN) cream Apply 1 application topically 2 (two) times daily. Pt applies to arms and legs.   Yes Historical Provider, MD    REVIEW OF SYSTEMS:  Review of Systems  Constitutional: Positive for fever and chills. Negative for weight loss and malaise/fatigue.  HENT: Negative for ear pain, hearing loss and tinnitus.   Eyes: Negative for blurred vision, double vision, pain and redness.  Respiratory: Positive for sputum production (Scant whitish sputum) and shortness of breath. Negative for cough and hemoptysis.   Cardiovascular: Positive for chest pain (right lower pleurisy). Negative for palpitations, orthopnea and leg swelling.  Gastrointestinal: Negative for nausea, vomiting, abdominal pain, diarrhea and constipation.  Genitourinary: Negative for  dysuria, frequency and hematuria.  Musculoskeletal: Negative for back pain, joint pain and neck pain.  Skin:       No acne, rash, or lesions  Neurological: Negative for dizziness, tremors, focal weakness and weakness.  Endo/Heme/Allergies: Negative for polydipsia. Does not bruise/bleed easily.  Psychiatric/Behavioral: Negative for depression. The patient is not nervous/anxious and does not have insomnia.      VITAL SIGNS:   Filed Vitals:   01/04/15 1911  BP: 129/95  Pulse: 69  Temp: 98 F (36.7 C)  TempSrc: Oral  Resp: 22  Height:  (1.753 m)  Weight: 66.225 kg (146 lb)  SpO2: 98%   Wt Readings from Last 3 Encounters:  01/04/15 66.225 kg (146 lb)  07/04/14 72.349 kg (159 lb 8  oz)    PHYSICAL EXAMINATION:  Physical Exam  Vitals reviewed. Constitutional: He is oriented to person, place, and time. He appears well-developed and well-nourished. No distress.  HENT:  Head: Normocephalic and atraumatic.  Mouth/Throat: Oropharynx is clear and moist.  Eyes: Conjunctivae and EOM are normal. Pupils are equal, round, and reactive to light. No scleral icterus.  Neck: Normal range of motion. Neck supple. No JVD present. No thyromegaly present.  Cardiovascular: Intact distal pulses.  Exam reveals no gallop and no friction rub.   No murmur heard. Irregular rhythm, controlled rate  Respiratory: Effort normal. No respiratory distress. He has no wheezes. He has rales (Soft but coarse popping rales right lower lobe).  Diminished breath sounds right base  GI: Soft. Bowel sounds are normal. He exhibits no distension. There is no tenderness.  Musculoskeletal: Normal range of motion. He exhibits no edema.  No arthritis, no gout  Lymphadenopathy:    He has no cervical adenopathy.  Neurological: He is alert and oriented to person, place, and time. No cranial nerve deficit.  No dysarthria, no aphasia  Skin: Skin is warm and dry. No rash noted. No erythema.  Psychiatric: He has a normal mood  and affect. His behavior is normal. Judgment and thought content normal.    LABORATORY PANEL:   CBC  Recent Labs Lab 01/04/15 1907  WBC 7.8  HGB 12.3*  HCT 36.8*  PLT 160   ------------------------------------------------------------------------------------------------------------------  Chemistries   Recent Labs Lab 01/04/15 1907  NA 141  K 3.6  CL 104  CO2 32  GLUCOSE 116*  BUN 17  CREATININE 0.97  CALCIUM 8.8*  AST 20  ALT 13*  ALKPHOS 79  BILITOT 0.1*   ------------------------------------------------------------------------------------------------------------------  Cardiac Enzymes  Recent Labs Lab 01/04/15 1907  TROPONINI <0.03   ------------------------------------------------------------------------------------------------------------------  RADIOLOGY:  Dg Abd 1 View  01/04/2015  CLINICAL DATA:  Initial evaluation for acute abdominal pain. EXAM: ABDOMEN - 1 VIEW COMPARISON:  Prior study from 07/04/2014. FINDINGS: Visualized bowel gas pattern within normal limits without evidence for obstruction or ileus. Paucity of gas limits evaluation of the small bowel. No abnormal bowel wall thickening. Moderate amount of retained stool within the left colon. No soft tissue mass. Scattered vascular calcifications present within the abdomen. Few scattered surgical clips noted. No free air. Small right pleural effusion with associated right basilar atelectasis noted. Visualized left lung base is clear. No acute osseus abnormality. Degenerative changes present at the hips and within the lower lumbar spine. IMPRESSION: 1. Nonobstructive bowel gas pattern. 2. Small layering right pleural effusion with associated right basilar atelectasis. Electronically Signed   By: Rise MuBenjamin  McClintock M.D.   On: 01/04/2015 20:55   Dg Chest Portable 1 View  01/04/2015  CLINICAL DATA:  Acute onset of worsening shortness of breath and intermittent chest pain. Initial encounter. EXAM:  PORTABLE CHEST 1 VIEW COMPARISON:  Chest radiograph from 11/19/2014 FINDINGS: The lungs are well-aerated. A small right pleural effusion is noted. There is no evidence of pneumothorax. The cardiomediastinal silhouette is borderline normal in size. The patient is status post median sternotomy, with evidence of prior CABG. No acute osseous abnormalities are seen. IMPRESSION: Small right pleural effusion noted. Lungs otherwise clear. This could conceivably reflect pneumonia, depending on the patient's symptoms. Would perform follow-up chest radiograph in 3-4 weeks, to ensure resolution of pleural effusion. Electronically Signed   By: Roanna RaiderJeffery  Chang M.D.   On: 01/04/2015 19:48    EKG:   Orders placed or performed during the hospital  encounter of 01/04/15  . ED EKG  . ED EKG  . EKG 12-Lead  . EKG 12-Lead    IMPRESSION AND PLAN:  Principal Problem:   HCAP (healthcare-associated pneumonia) - suspicion of pneumonia right lower lobe, as the patient lives in a nursing facility this must be designated as healthcare associated pneumonia. He was given azithromycin and Rocephin in the ED, we will switch this to vancomycin and Zosyn on admission. White blood cell count is not elevated. We'll get a sputum culture and blood cultures, and antibiotics will likely be able to be tailored soon. Active Problems:   Chronic systolic CHF (congestive heart failure) (HCC) - continue home meds   HTN (hypertension) - normotensive here, hold some home antihypertensives for now, can be restarted if needed.   Chronic a-fib (HCC) - continue rate controlling medications including digoxin   Dementia - on Seroquel, hold for now as patient is not having behavioral disturbance and QTC was 476   GERD (gastroesophageal reflux disease) - home dose PPI   Anxiety - continue home meds   COPD (HCC) - do nebs when necessary, continue other home meds   BPH (benign prostatic hyperplasia) - continue home meds  All the records are reviewed  and case discussed with ED provider. Management plans discussed with the patient and/or family.  DVT PROPHYLAXIS: SubQ lovenox  ADMISSION STATUS: Inpatient  CODE STATUS: DO NOT RESUSCITATE  TOTAL TIME TAKING CARE OF THIS PATIENT: 45 minutes.    Lew Prout FIELDING 01/04/2015, 9:00 PM  [employer] [facility]  Office  [phone]  CC: Primary care physician; Lyndon Code, MD

## 2015-01-04 NOTE — ED Provider Notes (Signed)
[facility] Emergency Department Provider Note  ____________________________________________  Time seen: Approximately 6:58 PM  I have reviewed the triage vital signs and the nursing notes.   HISTORY  Chief Complaint Shortness of Breath    HPI Darrell Mcconnell is a 79 y.o. male with dementia (probably Alzheimer's) with behavioral disturbance (psych consult 02/12/2011), AAA, AAA repair 2001, CAD s/p CABG (11/2010), CM with EF 35-40% (ECHO 09/04/13), mod-severe MR/TR, COPD, h/o tobacco use, HTN, hyperlipidemia, PUD, vision loss due to glaucoma, CVA, a.fib who presents for evaluation of worsening shortness of breath since yesterday, gradual onset, constant since onset, currently moderate. [facility]. He is feeling short of breath this evening and they gave him a breathing treatment however he reports he felt no improvement in his shortness of breath. In route with EMS, he developed some intermittent left-sided chest pain. He denies any vomiting, diarrhea, fevers or chills. No modifying factors.   Past Medical History  Diagnosis Date  . Chronic systolic CHF (congestive heart failure) (HCC)   . COPD (chronic obstructive pulmonary disease) (HCC)   . Aorta aneurysm (HCC)   . Asthma   . Hypertension   . Anxiety   . A-fib (HCC)   . Depression   . Hypothyroidism   . GERD (gastroesophageal reflux disease)   . Dementia   . BPH (benign prostatic hyperplasia)     Patient Active Problem List   Diagnosis Date Noted  . Chronic systolic CHF (congestive heart failure) (HCC) 01/04/2015  . GERD (gastroesophageal reflux disease) 01/04/2015  . HTN (hypertension) 01/04/2015  . Anxiety 01/04/2015  . Chronic a-fib (HCC) 01/04/2015  . Dementia 01/04/2015  . COPD (chronic obstructive pulmonary disease) (HCC) 01/04/2015  . BPH (benign prostatic hyperplasia) 01/04/2015  . HCAP (healthcare-associated pneumonia) 07/04/2014  . COPD exacerbation (HCC)  07/04/2014  . Aneurysm of aorta (HCC) 07/04/2014    Past Surgical History  Procedure Laterality Date  . Coronary artery bypass graft    . Prostate surgery    . Abdominal aortic aneurysm repair      No current outpatient prescriptions on file.  Allergies Review of patient's allergies indicates no known allergies.  Family History  Problem Relation Age of Onset  . CAD Father     Social History Social History  Substance Use Topics  . Smoking status: Former Games developer  . Smokeless tobacco: None  . Alcohol Use: No    Review of Systems Constitutional: No fever/chills Eyes: No visual changes. ENT: No sore throat. Cardiovascular: + chest pain. Respiratory: + shortness of breath. Gastrointestinal: No abdominal pain.  No nausea, no vomiting.  No diarrhea.  No constipation. Genitourinary: Negative for dysuria. Musculoskeletal: Negative for back pain. Skin: Negative for rash. Neurological: Negative for headaches, focal weakness or numbness.  10-point ROS otherwise negative.  ____________________________________________   PHYSICAL EXAM:  Filed Vitals:   01/04/15 1911 01/04/15 2113  BP: 129/95 133/92  Pulse: 69 82  Temp: 98 F (36.7 C) 98.1 F (36.7 C)  TempSrc: Oral   Resp: 22 22  Height:  (1.753 m)   Weight: 146 lb (66.225 kg)   SpO2: 98% 97%     Constitutional: Alert and mildly demented. Nontoxic-appearing and in no acute distress. Eyes: Conjunctivae are normal. PERRL. EOMI. Head: Atraumatic. Nose: No congestion/rhinnorhea. Mouth/Throat: Mucous membranes are moist.  Oropharynx non-erythematous. Neck: No stridor.   Cardiovascular: Normal rate, irregular rhythm. Grossly normal heart sounds.  Good peripheral circulation. Respiratory: Mild tachypnea. Only increased work of breathing.  Diminished breath sounds in the right base. Faint expiratory wheeze in the left. Gastrointestinal: Soft and nontender. No distention.  No CVA tenderness. Genitourinary:  deferred Musculoskeletal: No lower extremity tenderness nor edema.  No joint effusions. Neurologic:  Normal speech and language. No gross focal neurologic deficits are appreciated. Skin:  Skin is warm, dry and intact. No rash noted. Psychiatric: Mood and affect are normal. Speech and behavior are normal.  ____________________________________________   LABS (all labs ordered are listed, but only abnormal results are displayed)  Labs Reviewed  CBC WITH DIFFERENTIAL/PLATELET - Abnormal; Notable for the following:    Hemoglobin 12.3 (*)    HCT 36.8 (*)    RDW 16.6 (*)    All other components within normal limits  COMPREHENSIVE METABOLIC PANEL - Abnormal; Notable for the following:    Glucose, Bld 116 (*)    Calcium 8.8 (*)    Albumin 3.2 (*)    ALT 13 (*)    Total Bilirubin 0.1 (*)    All other components within normal limits  DIGOXIN LEVEL - Abnormal; Notable for the following:    Digoxin Level 0.4 (*)    All other components within normal limits  CULTURE, BLOOD (ROUTINE X 2)  CULTURE, BLOOD (ROUTINE X 2)  TROPONIN I  LIPASE, BLOOD  PROTIME-INR  APTT  URINALYSIS COMPLETEWITH MICROSCOPIC ([facility] ONLY)   ____________________________________________  EKG  ED ECG REPORT I, Gayla DossGayle, Kea Callan A, the attending physician, personally viewed and interpreted this ECG.   Date: 01/04/2015  EKG Time: 19:18  Rate: 77  Rhythm: atrial fibrillation, rate 77  Axis: normal  Intervals:none  ST&T Change: No acute ST elevation.  ____________________________________________  RADIOLOGY  CXR IMPRESSION: Small right pleural effusion noted. Lungs otherwise clear. This could conceivably reflect pneumonia, depending on the patient's symptoms. Would perform follow-up chest radiograph in 3-4 weeks, to ensure resolution of pleural effusion.  Xray abdomen IMPRESSION: 1. Nonobstructive bowel gas pattern. 2. Small layering right pleural effusion with associated right basilar  atelectasis.  ____________________________________________   PROCEDURES  Procedure(s) performed: None  Critical Care performed: No  ____________________________________________   INITIAL IMPRESSION / ASSESSMENT AND PLAN / ED COURSE  Pertinent labs & imaging results that were available during my care of the patient were reviewed by me and considered in my medical decision making (see chart for details).  Revonda StandardClyde Mcconnell is a 79 y.o. 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 On exam, he is mildly tachypnea, intermittent complaining of mild left-sided chest pain. He does have diminished breath sounds in the right base as well as wheeze in the left lung fields. Labs reviewed. No leukocytosis. Mild anemia with hemoglobin 12.3. CMP is generally unremarkable. Troponin negative. Chest x-ray shows right pleural effusion which could represent pneumonia and given the patient's multiple comorbidities, will admit to the hospital for IV antibiotics. IV ceftriaxone and azithromycin given.  ----------------------------------------- 9:01 PM on 01/04/2015 -----------------------------------------  Case discussed with Dr. Anne HahnWillis for admission. ____________________________________________   FINAL CLINICAL IMPRESSION(S) / ED DIAGNOSES  Final diagnoses:  Community acquired pneumonia      Gayla DossEryka A Merrit Waugh, MD 01/04/15 2150

## 2015-01-04 NOTE — ED Notes (Signed)
Patient here from nursing facility with increasing shortness of breath, on oxygen at 2l all the time for COPD. COMPLAINS OF INTERMITTENT cp . NO DISTRESS ON ARRIVAL

## 2015-01-04 NOTE — ED Notes (Signed)
Vincenza HewsShane, nurse tech notified to transport patient

## 2015-01-04 NOTE — Progress Notes (Signed)
ANTIBIOTIC CONSULT NOTE - INITIAL  Pharmacy Consult for vancomycin/Zosyn Indication: pneumonia  No Known Allergies  Patient Measurements: Height: 5\' 9"  (175.3 cm) Weight: 147 lb 11.2 oz (66.996 kg) IBW/kg (Calculated) : 70.7 Adjusted Body Weight: ABW  Vital Signs: Temp: 98 F (36.7 C) (11/11 2155) Temp Source: Oral (11/11 1911) BP: 153/88 mmHg (11/11 2155) Pulse Rate: 76 (11/11 2155) Intake/Output from previous day:   Intake/Output from this shift:    Labs:  Recent Labs  01/04/15 1907  WBC 7.8  HGB 12.3*  PLT 160  CREATININE 0.97   Estimated Creatinine Clearance: 60.4 mL/min (by C-G formula based on Cr of 0.97). No results for input(s): VANCOTROUGH, VANCOPEAK, VANCORANDOM, GENTTROUGH, GENTPEAK, GENTRANDOM, TOBRATROUGH, TOBRAPEAK, TOBRARND, AMIKACINPEAK, AMIKACINTROU, AMIKACIN in the last 72 hours.   Microbiology: No results found for this or any previous visit (from the past 720 hour(s)).  Medical History: Past Medical History  Diagnosis Date  . Chronic systolic CHF (congestive heart failure) (HCC)   . COPD (chronic obstructive pulmonary disease) (HCC)   . Aorta aneurysm (HCC)   . Asthma   . Hypertension   . Anxiety   . A-fib (HCC)   . Depression   . Hypothyroidism   . GERD (gastroesophageal reflux disease)   . Dementia   . BPH (benign prostatic hyperplasia)     Medications:  Infusions:  . sodium chloride     Assessment: 79 yom cc SOB with history of COPD presents with CXR showing RLL effusion concerning for PNA. Starting vancomycin/Zosyn for HCAP as pt resides in nursing facility.  Vd 46.9 L, Ke 0.055 hr-1, T1/2 12.7 hr.   Goal of Therapy:  Vancomycin trough level 15-20 mcg/ml  Plan:  Expected duration 7 days with resolution of temperature and/or normalization of WBC. Zosyn 3.375 gm IV Q8H EI and vancomycin 750 mg IV Q12H with stacked dosing second dose 8 hours after first will continue to follow and adjust as needed to maintain trough 15 to 20  mcg/mL.  Carola FrostNathan A Shanyce Daris, Pharm.D.  Clinical Pharmacist 01/04/2015,10:29 PM

## 2015-01-05 ENCOUNTER — Encounter: Payer: Self-pay | Admitting: *Deleted

## 2015-01-05 LAB — CBC
HCT: 35.8 % — ABNORMAL LOW (ref 40.0–52.0)
Hemoglobin: 12.2 g/dL — ABNORMAL LOW (ref 13.0–18.0)
MCH: 28.6 pg (ref 26.0–34.0)
MCHC: 34 g/dL (ref 32.0–36.0)
MCV: 84.3 fL (ref 80.0–100.0)
PLATELETS: 155 10*3/uL (ref 150–440)
RBC: 4.25 MIL/uL — AB (ref 4.40–5.90)
RDW: 16.5 % — ABNORMAL HIGH (ref 11.5–14.5)
WBC: 7.4 10*3/uL (ref 3.8–10.6)

## 2015-01-05 LAB — URINALYSIS COMPLETE WITH MICROSCOPIC (ARMC ONLY)
BILIRUBIN URINE: NEGATIVE
Bacteria, UA: NONE SEEN
Glucose, UA: NEGATIVE mg/dL
HGB URINE DIPSTICK: NEGATIVE
KETONES UR: NEGATIVE mg/dL
LEUKOCYTES UA: NEGATIVE
Nitrite: NEGATIVE
PH: 5 (ref 5.0–8.0)
Protein, ur: NEGATIVE mg/dL
Specific Gravity, Urine: 1.02 (ref 1.005–1.030)

## 2015-01-05 LAB — BASIC METABOLIC PANEL
Anion gap: 5 (ref 5–15)
BUN: 18 mg/dL (ref 6–20)
CALCIUM: 8.4 mg/dL — AB (ref 8.9–10.3)
CO2: 33 mmol/L — AB (ref 22–32)
CREATININE: 0.92 mg/dL (ref 0.61–1.24)
Chloride: 102 mmol/L (ref 101–111)
Glucose, Bld: 95 mg/dL (ref 65–99)
Potassium: 3.9 mmol/L (ref 3.5–5.1)
SODIUM: 140 mmol/L (ref 135–145)

## 2015-01-05 LAB — MRSA PCR SCREENING: MRSA BY PCR: NEGATIVE

## 2015-01-05 NOTE — Consult Note (Addendum)
  Pt seen in Room No 224 at [facility]. S Pt is a 79 yr old white male, retired after working for [employer] for [employer] yrs. Widowed since Dec of 2014 after being married for 55 yrs. Pt has been living at a Nursing Facility for 4 yrs as wife was not able to care for him and she lived in the house. Pt has been feeling depressed and expressed suicidal thoughts and ideas. CC " I came here for breathing problems and my Pneumonia and have suicidal wishes. Past psych history. H/O [facility] months because " he was out of his head." No H/O suicide attempts but does have suicide wishes and thoughts. Alcohol and drugs. Denies but did smoke cigs for many yrs and quit. M.s. Pt is hard of hearing. Alert and oriented to place and knew the month and yr. Affect is flat and mood is restricted and depressed. No psychosis. Does feel low and down and feels h/h and w/u.  No agitation. Denies homicidal ideas but does express suicidal wishes and thoughts though he contracts for  Safety. Behavior is unpredictable and pt cannot contract for safety. I/J guarded and Impulse control is poor. Imp MDD Recurrent with SI and cannot contract for safety. REc Continue One on one observation and will re-eval on 01/06/2015   Came to see pt for follow up. S Pt is still being watched one on one and helped by CNA as needed. He still expresses suicidal wishes and thoughts. O Pt is hard of hearing. Alert and oriented to place and person and not to exact date except month and yr. Anxious and frustrated. Affect is flat and mood is low and down and depressed. Feels h/h about himself  No psychosis. Does have suicidal wishes but no active plans but behavior is unpredictable and impulse control is poor and does need help at this time. I/J guarded. Imp MDD Recurrent with SI and cannot contract for safety. Rec continue current meds and wait for anti-depresent to start acting,.and continue one on one observation and re-eval pt in AM  of 01/07/2015

## 2015-01-05 NOTE — Progress Notes (Signed)
   01/05/15 1000  Clinical Encounter Type  Visited With Patient  Visit Type Initial  Referral From Nurse  Spiritual Encounters  Spiritual Needs Prayer;Emotional  Stress Factors  Patient Stress Factors Family relationships;Health changes  Chaplain visited with patient. Patient expressed grief over loss of his wife. Patient appeared anxious and stressed. Chaplain worked to be a non anxious presence and offered prayer and support.

## 2015-01-05 NOTE — Clinical Social Work Note (Signed)
Patient was admitted from [facility]. Patient is currently under suicide precautions for making gestures of hitting himself and stating he wants to harm himself. Patient with a diagnosis of Alzheimer's Dementia with behavioral disturbances as mentioned by Palliative Care in last admission in May of 2016. At that time, patient was discharged back to [facility] with [facility] following.   CSW has left message for owner of [facility]: Josephine IgoLynnette and for son Darrell: [phone]. York SpanielMonica Harlan Ervine MSW,LCSW [phone]

## 2015-01-05 NOTE — NC FL2 (Signed)
MEDICAID FL2 LEVEL OF CARE SCREENING TOOL     IDENTIFICATION  Patient Name: Darrell Mcconnell Birthdate: January 01, 1936 Sex: male Admission Date (Current Location): 01/04/2015  [facility] and [place] Number:     Facility and Address:  [facility], [address], [place], [place] [address]      Provider Number: [id number]  Attending Physician Name and Address:  Ramonita Lab, MD  Relative Name and Phone Number:       Current Level of Care: Hospital Recommended Level of Care: Assisted Living Facility Prior Approval Number:    Date Approved/Denied:   PASRR Number:    Discharge Plan:  (ALF)    Current Diagnoses: Patient Active Problem List   Diagnosis Date Noted  . Chronic systolic CHF (congestive heart failure) (HCC) 01/04/2015  . GERD (gastroesophageal reflux disease) 01/04/2015  . HTN (hypertension) 01/04/2015  . Anxiety 01/04/2015  . Chronic a-fib (HCC) 01/04/2015  . Dementia 01/04/2015  . COPD (chronic obstructive pulmonary disease) (HCC) 01/04/2015  . BPH (benign prostatic hyperplasia) 01/04/2015  . HCAP (healthcare-associated pneumonia) 07/04/2014  . COPD exacerbation (HCC) 07/04/2014  . Aneurysm of aorta (HCC) 07/04/2014    Orientation ACTIVITIES/SOCIAL BLADDER RESPIRATION    Self, Place  Active Continent Normal  BEHAVIORAL SYMPTOMS/MOOD NEUROLOGICAL BOWEL NUTRITION STATUS   (none)  (none) Continent Diet  PHYSICIAN VISITS COMMUNICATION OF NEEDS Height & Weight Skin  30 days Verbally   149 lbs. Normal          AMBULATORY STATUS RESPIRATION    Supervision limited Normal      Personal Care Assistance Level of Assistance  Bathing, Dressing Bathing Assistance: Limited assistance   Dressing Assistance: Limited assistance      Functional Limitations Info                SPECIAL CARE FACTORS FREQUENCY                      Additional Factors Info  Code Status, Allergies Code Status Info: DNR Allergies Info:  NKA           Current Medications (01/05/2015): Current Facility-Administered Medications  Medication Dose Route Frequency Provider Last Rate Last Dose  . acetaminophen (TYLENOL) tablet 650 mg  650 mg Oral Q6H PRN Oralia Manis, MD       Or  . acetaminophen (TYLENOL) suppository 650 mg  650 mg Rectal Q6H PRN Oralia Manis, MD      . ALPRAZolam Prudy Feeler) tablet 0.25 mg  0.25 mg Oral Q8H PRN Oralia Manis, MD   0.25 mg at 01/05/15 1548  . aspirin EC tablet 81 mg  81 mg Oral QPM Oralia Manis, MD   81 mg at 01/04/15 2309  . brimonidine (ALPHAGAN) 0.2 % ophthalmic solution 1 drop  1 drop Both Eyes BID Ramonita Lab, MD   1 drop at 01/05/15 1054   And  . timolol (TIMOPTIC) 0.5 % ophthalmic solution 1 drop  1 drop Both Eyes BID Ramonita Lab, MD   1 drop at 01/05/15 1056  . digoxin (LANOXIN) tablet 0.125 mg  0.125 mg Oral Daily Oralia Manis, MD   0.125 mg at 01/05/15 1053  . dorzolamide (TRUSOPT) 2 % ophthalmic solution 1 drop  1 drop Both Eyes BID Oralia Manis, MD   1 drop at 01/05/15 1055  . enoxaparin (LOVENOX) injection 40 mg  40 mg Subcutaneous Q24H Oralia Manis, MD   40 mg at 01/04/15 2314  . escitalopram (LEXAPRO) tablet 15 mg  15  mg Oral Daily Oralia Manis, MD   15 mg at 01/05/15 1055  . finasteride (PROSCAR) tablet 5 mg  5 mg Oral Daily Oralia Manis, MD   5 mg at 01/05/15 1053  . furosemide (LASIX) tablet 40 mg  40 mg Oral Daily Oralia Manis, MD   40 mg at 01/05/15 1053  . ipratropium-albuterol (DUONEB) 0.5-2.5 (3) MG/3ML nebulizer solution 3 mL  3 mL Nebulization Q4H PRN Oralia Manis, MD   3 mL at 01/05/15 1230  . levothyroxine (SYNTHROID, LEVOTHROID) tablet 25 mcg  25 mcg Oral QAC breakfast Oralia Manis, MD   25 mcg at 01/05/15 1052  . metoprolol tartrate (LOPRESSOR) tablet 12.5 mg  12.5 mg Oral Daily Oralia Manis, MD   12.5 mg at 01/05/15 1052  . ondansetron (ZOFRAN) tablet 4 mg  4 mg Oral Q6H PRN Oralia Manis, MD       Or  . ondansetron [facility]) injection 4 mg  4 mg Intravenous Q6H PRN Oralia Manis, MD      . pantoprazole (PROTONIX) EC tablet 40 mg  40 mg Oral QAC breakfast Oralia Manis, MD   40 mg at 01/05/15 1053  . piperacillin-tazobactam (ZOSYN) IVPB 3.375 g  3.375 g Intravenous 3 times per day Ramonita Lab, MD   3.375 g at 01/05/15 1100  . senna-docusate (Senokot-S) tablet 1 tablet  1 tablet Oral QHS PRN Oralia Manis, MD      . sodium chloride 0.9 % injection 3 mL  3 mL Intravenous Q12H Oralia Manis, MD   3 mL at 01/05/15 1056  . vancomycin (VANCOCIN) IVPB 750 mg/150 ml premix  750 mg Intravenous Q12H Ramonita Lab, MD   750 mg at 01/05/15 1101   Do not use this list as official medication orders. Please verify with discharge summary.  Discharge Medications:   Medication List    ASK your doctor about these medications        ALPRAZolam 0.25 MG tablet  Commonly known as:  XANAX  Take 0.25 mg by mouth every 8 (eight) hours as needed for anxiety.     aspirin EC 81 MG tablet  Take 81 mg by mouth every evening.     bimatoprost 0.01 % Soln  Commonly known as:  LUMIGAN  Place 1 drop into both eyes at bedtime.     cholecalciferol 400 UNITS Tabs tablet  Commonly known as:  VITAMIN D  Take 400 Units by mouth daily.     COMBIGAN 0.2-0.5 % ophthalmic solution  Generic drug:  brimonidine-timolol  Place 1 drop into both eyes 2 (two) times daily.     digoxin 0.125 MG tablet  Commonly known as:  LANOXIN  Take 0.125 mg by mouth daily.     docusate sodium 100 MG capsule  Commonly known as:  COLACE  Take 100 mg by mouth 2 (two) times daily.     dorzolamide 2 % ophthalmic solution  Commonly known as:  TRUSOPT  Place 1 drop into both eyes 2 (two) times daily.     escitalopram 10 MG tablet  Commonly known as:  LEXAPRO  Take 15 mg by mouth daily.     eucerin cream  Apply 1 application topically 2 (two) times daily. Pt applies to arms and legs.     finasteride 5 MG tablet  Commonly known as:  PROSCAR  Take 5 mg by mouth daily.     furosemide 40 MG tablet  Commonly  known as:  LASIX  Take 40 mg by mouth daily.  hydrocortisone cream 1 %  Apply 1 application topically as needed for itching (and rash).     ipratropium-albuterol 0.5-2.5 (3) MG/3ML Soln  Commonly known as:  DUONEB  Take 3 mLs by nebulization 3 (three) times daily as needed (for shortness of breath/wheezing).     levothyroxine 25 MCG tablet  Commonly known as:  SYNTHROID, LEVOTHROID  Take 25 mcg by mouth daily.     lisinopril 2.5 MG tablet  Commonly known as:  PRINIVIL,ZESTRIL  Take 2.5 mg by mouth daily.     Melatonin 3 MG Tabs  Take 3 mg by mouth at bedtime.     meloxicam 7.5 MG tablet  Commonly known as:  MOBIC  Take 7.5 mg by mouth daily.     metoCLOPramide 10 MG tablet  Commonly known as:  REGLAN  Take 10 mg by mouth 3 (three) times daily before meals.     metoprolol tartrate 25 MG tablet  Commonly known as:  LOPRESSOR  Take 12.5 mg by mouth daily.     mirtazapine 30 MG tablet  Commonly known as:  REMERON  Take 30 mg by mouth at bedtime.     ondansetron 4 MG tablet  Commonly known as:  ZOFRAN  Take 4 mg by mouth 2 (two) times daily as needed for nausea or vomiting.     pantoprazole 40 MG tablet  Commonly known as:  PROTONIX  Take 40 mg by mouth daily.     polyethylene glycol packet  Commonly known as:  MIRALAX / GLYCOLAX  Take 17 g by mouth daily as needed for moderate constipation.     PREPLUS 27-1 MG Tabs  Take 1 tablet by mouth daily.     QUEtiapine 50 MG tablet  Commonly known as:  SEROQUEL  Take 50 mg by mouth at bedtime.     QUEtiapine 25 MG tablet  Commonly known as:  SEROQUEL  Take 25 mg by mouth 3 (three) times daily.     simethicone 80 MG chewable tablet  Commonly known as:  MYLICON  Chew 80 mg by mouth every 4 (four) hours as needed for flatulence.        Relevant Imaging Results:  Relevant Lab Results:  Recent Labs    Additional Information    York SpanielMonica Tequan Redmon, LCSW

## 2015-01-05 NOTE — Progress Notes (Signed)
[facility] [place] [facility]   PATIENT NAME: Darrell StandardClyde Mcconnell    MR#:  [id number]  DATE OF BIRTH:  1935-04-21  SUBJECTIVE:  CHIEF COMPLAINT:  Patient is very hard of hearing. Reports shortness of breath is better but actively depressed. Hallucinating sometimes. As reported by the nurse have some suicidal ideation.  REVIEW OF SYSTEMS:  CONSTITUTIONAL: No fever, fatigue or weakness.  EYES: No blurred or double vision.  EARS, NOSE, AND THROAT: No tinnitus or ear pain.  RESPIRATORY: No cough, shortness of breath, wheezing or hemoptysis.  CARDIOVASCULAR: No chest pain, orthopnea, edema.  GASTROINTESTINAL: No nausea, vomiting, diarrhea or abdominal pain.  GENITOURINARY: No dysuria, hematuria.  ENDOCRINE: No polyuria, nocturia,  HEMATOLOGY: No anemia, easy bruising or bleeding SKIN: No rash or lesion. MUSCULOSKELETAL: No joint pain or arthritis.   NEUROLOGIC: No tingling, numbness, weakness.  PSYCHIATRY: Has chronic  anxiety , active  depression.   DRUG ALLERGIES:  No Known Allergies  VITALS:  Blood pressure 130/79, pulse 58, temperature 98 F (36.7 C), temperature source Oral, resp. rate 17, height 5\' 9"  (1.753 m), weight 67.767 kg (149 lb 6.4 oz), SpO2 98 %.  PHYSICAL EXAMINATION:  GENERAL:  79 y.o.-year-old patient lying in the bed with no acute distress.  EYES: Pupils equal, round, reactive to light and accommodation. No scleral icterus. Extraocular muscles intact.  HEENT: Head atraumatic, normocephalic. Oropharynx and nasopharynx clear.  NECK:  Supple, no jugular venous distention. No thyroid enlargement, no tenderness.  LUNGS: Normal breath sounds bilaterally, no wheezing, rales,rhonchi or crepitation. No use of accessory muscles of respiration.  CARDIOVASCULAR: S1, S2 normal. No murmurs, rubs, or gallops.  ABDOMEN: Soft, nontender, nondistended. Bowel sounds present. No organomegaly or mass.  EXTREMITIES: No pedal edema, cyanosis, or clubbing.   NEUROLOGIC: Cranial nerves II through XII are intact. Muscle strength grossly intact. Sensation intact. Gait not checked.  PSYCHIATRIC: The patient is alert and oriented x 2.  SKIN: No obvious rash, lesion, or ulcer.    LABORATORY PANEL:   CBC  Recent Labs Lab 01/05/15 0529  WBC 7.4  HGB 12.2*  HCT 35.8*  PLT 155   ------------------------------------------------------------------------------------------------------------------  Chemistries   Recent Labs Lab 01/04/15 1907 01/05/15 0529  NA 141 140  K 3.6 3.9  CL 104 102  CO2 32 33*  GLUCOSE 116* 95  BUN 17 18  CREATININE 0.97 0.92  CALCIUM 8.8* 8.4*  AST 20  --   ALT 13*  --   ALKPHOS 79  --   BILITOT 0.1*  --    ------------------------------------------------------------------------------------------------------------------  Cardiac Enzymes  Recent Labs Lab 01/04/15 1907  TROPONINI <0.03   ------------------------------------------------------------------------------------------------------------------  RADIOLOGY:  Dg Abd 1 View  01/04/2015  CLINICAL DATA:  Initial evaluation for acute abdominal pain. EXAM: ABDOMEN - 1 VIEW COMPARISON:  Prior study from 07/04/2014. FINDINGS: Visualized bowel gas pattern within normal limits without evidence for obstruction or ileus. Paucity of gas limits evaluation of the small bowel. No abnormal bowel wall thickening. Moderate amount of retained stool within the left colon. No soft tissue mass. Scattered vascular calcifications present within the abdomen. Few scattered surgical clips noted. No free air. Small right pleural effusion with associated right basilar atelectasis noted. Visualized left lung base is clear. No acute osseus abnormality. Degenerative changes present at the hips and within the lower lumbar spine. IMPRESSION: 1. Nonobstructive bowel gas pattern. 2. Small layering right pleural effusion with associated right basilar atelectasis. Electronically Signed   By:  Janell QuietBenjamin  McClintock M.D.  On: 01/04/2015 20:55   Dg Chest Portable 1 View  01/04/2015  CLINICAL DATA:  Acute onset of worsening shortness of breath and intermittent chest pain. Initial encounter. EXAM: PORTABLE CHEST 1 VIEW COMPARISON:  Chest radiograph from 11/19/2014 FINDINGS: The lungs are well-aerated. A small right pleural effusion is noted. There is no evidence of pneumothorax. The cardiomediastinal silhouette is borderline normal in size. The patient is status post median sternotomy, with evidence of prior CABG. No acute osseous abnormalities are seen. IMPRESSION: Small right pleural effusion noted. Lungs otherwise clear. This could conceivably reflect pneumonia, depending on the patient's symptoms. Would perform follow-up chest radiograph in 3-4 weeks, to ensure resolution of pleural effusion. Electronically Signed   By: Roanna Raider M.D.   On: 01/04/2015 19:48    EKG:   Orders placed or performed during the hospital encounter of 01/04/15  . ED EKG  . ED EKG  . EKG 12-Lead  . EKG 12-Lead    ASSESSMENT AND PLAN:    #HCAP (healthcare-associated pneumonia) - suspicion of pneumonia right lower lobe, - as the patient lives in a nursing facility this must be designated as healthcare associated pneumonia. - we will continue IV vancomycin and Zosyn  - We'll follow up on sputum culture and blood cultures, and antibiotics will likely be able to be tailored soon.  #Acute depression with suicidal ideation Patient is made IVC One-on-one observation for safety Psychiatric consult is placed    #Chronic systolic CHF (congestive heart failure) (HCC) - not fluid overloaded at this time  continue home meds  # HTN (hypertension) - normotensive here  hold some home antihypertensives for now, can be restarted if needed.  # Chronic a-fib (HCC) - rate controlled at this time continue rate controlling medications including digoxin Check dig  Levels    #Dementia - on Seroquel, hold for  now as patient is not having behavioral disturbance and QTC was 476   #GERD (gastroesophageal reflux disease) - home dose PPI   #Anxiety - continue home meds    #COPD (HCC) - do nebs when necessary, continue other home meds   BPH (benign prostatic hyperplasia) - continue home meds      All the records are reviewed and case discussed with Care Management/Social Workerr. Management plans discussed with the patient, family and they are in agreement.  CODE STATUS: DNR  TOTAL TIME TAKING CARE OF THIS PATIENT: 35 minutes.   POSSIBLE D/C IN 2-3 DAYS, DEPENDING ON CLINICAL CONDITION.   Ramonita Lab M.D on 01/05/2015 at 3:16 PM  Between 7am to 6pm - Pager - [phone] After 6pm go to [email or web address] - password [email or web address] [facility]  [facility]  Office  [phone]  CC: Primary care physician; Lyndon Code, MD

## 2015-01-05 NOTE — Progress Notes (Signed)
Informed Dr. Amado CoeGouru that pt is consistently hitting himself in the head with the call bell and fist.  When addressed, and asked why he is doing that, the pt responds by saying "maybe I do want to hurt myself."  Pt tearful.  Orders received for pt to be IVC w/sitter at bedside.  A psych consult has been ordered as well.  Will continue to monitor.

## 2015-01-06 LAB — BASIC METABOLIC PANEL
Anion gap: 8 (ref 5–15)
BUN: 11 mg/dL (ref 6–20)
CALCIUM: 8.7 mg/dL — AB (ref 8.9–10.3)
CHLORIDE: 99 mmol/L — AB (ref 101–111)
CO2: 31 mmol/L (ref 22–32)
CREATININE: 1 mg/dL (ref 0.61–1.24)
GFR calc non Af Amer: >60 mL/min (ref >60)
Glucose, Bld: 117 mg/dL — ABNORMAL HIGH (ref 65–99)
Potassium: 3.5 mmol/L (ref 3.5–5.1)
SODIUM: 138 mmol/L (ref 135–145)

## 2015-01-06 LAB — CBC
HCT: 37.9 % — ABNORMAL LOW (ref 40.0–52.0)
HEMOGLOBIN: 12.2 g/dL — AB (ref 13.0–18.0)
MCH: 27.1 pg (ref 26.0–34.0)
MCHC: 32.3 g/dL (ref 32.0–36.0)
MCV: 84.1 fL (ref 80.0–100.0)
Platelets: 195 10*3/uL (ref 150–440)
RBC: 4.51 MIL/uL (ref 4.40–5.90)
RDW: 16.3 % — AB (ref 11.5–14.5)
WBC: 7.8 10*3/uL (ref 3.8–10.6)

## 2015-01-06 LAB — VANCOMYCIN, TROUGH: Vancomycin Tr: 12 ug/mL (ref 10–20)

## 2015-01-06 MED ORDER — ALPRAZOLAM 0.5 MG PO TABS
0.5000 mg | ORAL_TABLET | Freq: Three times a day (TID) | ORAL | Status: DC | PRN
  Administered 2015-01-06 – 2015-01-09 (×8): 0.5 mg via ORAL
  Filled 2015-01-06 (×8): qty 1

## 2015-01-06 MED ORDER — ZOLPIDEM TARTRATE 5 MG PO TABS
5.0000 mg | ORAL_TABLET | Freq: Every evening | ORAL | Status: DC | PRN
  Administered 2015-01-07 – 2015-01-08 (×2): 5 mg via ORAL
  Filled 2015-01-06 (×2): qty 1

## 2015-01-06 MED ORDER — VANCOMYCIN HCL IN DEXTROSE 1-5 GM/200ML-% IV SOLN
1000.0000 mg | Freq: Two times a day (BID) | INTRAVENOUS | Status: DC
  Administered 2015-01-06 – 2015-01-08 (×4): 1000 mg via INTRAVENOUS
  Filled 2015-01-06 (×8): qty 200

## 2015-01-06 MED ORDER — NITROGLYCERIN 0.4 MG SL SUBL
0.4000 mg | SUBLINGUAL_TABLET | SUBLINGUAL | Status: DC | PRN
  Administered 2015-01-06: 0.4 mg via SUBLINGUAL
  Filled 2015-01-06: qty 1

## 2015-01-06 MED ORDER — ALPRAZOLAM 0.5 MG PO TABS
0.5000 mg | ORAL_TABLET | Freq: Once | ORAL | Status: AC
  Administered 2015-01-06: 0.5 mg via ORAL
  Filled 2015-01-06: qty 1

## 2015-01-06 NOTE — Progress Notes (Signed)
Pt c/o 5/10 sharp chest pain, headache, SOB/tightness. Assessed, tylenol given for headache, respiratory therapist called for prn breathing treatment. MD notified, ordered sublingual nitroglycerin. Treatment effective. Will continue to monitor pt.

## 2015-01-06 NOTE — Clinical Social Work Note (Signed)
Lynnette at [facility] called CSW back yesterday evening and stated that they will be able to take patient back at discharge. Patient's son called CSW today and stated that he wishes for patient to return to [facility]. Patient's son states that patient has been "kicked out" of several assisted living facilities due to his complaints and behavior and that [facility] is the only one willing to work with patient. I explained to him that patient had stated that [facility] was mean to him and patient's son immediately stated that it does not surprise him that his father would say that and that he does not feel as though [facility] staff are mean to his father and that they treat him well. Patient's son states that patient has a history of saying that with all the other facilities he has been in and no concerns were substantiated at those times. York SpanielMonica Britian Jentz MSW,LCSW [phone]

## 2015-01-06 NOTE — Progress Notes (Signed)
[facility] - [place] at [facility]   PATIENT NAME: Darrell Mcconnell    MR#:  [id number]  DATE OF BIRTH:  1935-08-22  SUBJECTIVE:  CHIEF COMPLAINT:  Patient is very hard of hearing. Denies any shortness of breath today. Feeling very anxious and was unable to sleep last night. Requesting for sleep medicine.   REVIEW OF SYSTEMS:  CONSTITUTIONAL: No fever, fatigue or weakness.  EYES: No blurred or double vision.  EARS, NOSE, AND THROAT: No tinnitus or ear pain.  RESPIRATORY: No cough, shortness of breath, wheezing or hemoptysis.  CARDIOVASCULAR: No chest pain, orthopnea, edema.  GASTROINTESTINAL: No nausea, vomiting, diarrhea or abdominal pain.  GENITOURINARY: No dysuria, hematuria.  ENDOCRINE: No polyuria, nocturia,  HEMATOLOGY: No anemia, easy bruising or bleeding SKIN: No rash or lesion. MUSCULOSKELETAL: No joint pain or arthritis.   NEUROLOGIC: No tingling, numbness, weakness.  PSYCHIATRY: Has   anxiety , active  depression.   DRUG ALLERGIES:  No Known Allergies  VITALS:  Blood pressure 143/94, pulse 66, temperature 98.1 F (36.7 C), temperature source Oral, resp. rate 14, height  (1.753 m), weight 70.035 kg (154 lb 6.4 oz), SpO2 96 %.  PHYSICAL EXAMINATION:  GENERAL:  79 y.o.-year-old patient lying in the bed with no acute distress.  EYES: Pupils equal, round, reactive to light and accommodation. No scleral icterus.  HEENT: Head atraumatic, normocephalic. Oropharynx and nasopharynx clear.  NECK:  Supple, no jugular venous distention. No thyroid enlargement, no tenderness.  LUNGS: Normal breath sounds bilaterally, no wheezing, rales,rhonchi or crepitation. No use of accessory muscles of respiration.  CARDIOVASCULAR: S1, S2 normal. No murmurs, rubs, or gallops.  ABDOMEN: Soft, nontender, nondistended. Bowel sounds present. No organomegaly or mass.  EXTREMITIES: No pedal edema, cyanosis, or clubbing.  NEUROLOGIC: Cranial nerves II through XII are  intact. Muscle strength grossly intact. Sensation intact. Gait not checked.  PSYCHIATRIC: The patient is alert and oriented x 2. Very anxious SKIN: No obvious rash, lesion, or ulcer.    LABORATORY PANEL:   CBC  Recent Labs Lab 01/06/15 0654  WBC 7.8  HGB 12.2*  HCT 37.9*  PLT 195   ------------------------------------------------------------------------------------------------------------------  Chemistries   Recent Labs Lab 01/04/15 1907  01/06/15 0654  NA 141  < > 138  K 3.6  < > 3.5  CL 104  < > 99*  CO2 32  < > 31  GLUCOSE 116*  < > 117*  BUN 17  < > 11  CREATININE 0.97  < > 1.00  CALCIUM 8.8*  < > 8.7*  AST 20  --   --   ALT 13*  --   --   ALKPHOS 79  --   --   BILITOT 0.1*  --   --   < > = values in this interval not displayed. ------------------------------------------------------------------------------------------------------------------  Cardiac Enzymes  Recent Labs Lab 01/04/15 1907  TROPONINI <0.03   ------------------------------------------------------------------------------------------------------------------  RADIOLOGY:  Dg Abd 1 View  01/04/2015  CLINICAL DATA:  Initial evaluation for acute abdominal pain. EXAM: ABDOMEN - 1 VIEW COMPARISON:  Prior study from 07/04/2014. FINDINGS: Visualized bowel gas pattern within normal limits without evidence for obstruction or ileus. Paucity of gas limits evaluation of the small bowel. No abnormal bowel wall thickening. Moderate amount of retained stool within the left colon. No soft tissue mass. Scattered vascular calcifications present within the abdomen. Few scattered surgical clips noted. No free air. Small right pleural effusion with associated right basilar atelectasis noted. Visualized left lung base is  clear. No acute osseus abnormality. Degenerative changes present at the hips and within the lower lumbar spine. IMPRESSION: 1. Nonobstructive bowel gas pattern. 2. Small layering right pleural effusion  with associated right basilar atelectasis. Electronically Signed   By: Rise MuBenjamin  McClintock M.D.   On: 01/04/2015 20:55   Dg Chest Portable 1 View  01/04/2015  CLINICAL DATA:  Acute onset of worsening shortness of breath and intermittent chest pain. Initial encounter. EXAM: PORTABLE CHEST 1 VIEW COMPARISON:  Chest radiograph from 11/19/2014 FINDINGS: The lungs are well-aerated. A small right pleural effusion is noted. There is no evidence of pneumothorax. The cardiomediastinal silhouette is borderline normal in size. The patient is status post median sternotomy, with evidence of prior CABG. No acute osseous abnormalities are seen. IMPRESSION: Small right pleural effusion noted. Lungs otherwise clear. This could conceivably reflect pneumonia, depending on the patient's symptoms. Would perform follow-up chest radiograph in 3-4 weeks, to ensure resolution of pleural effusion. Electronically Signed   By: Roanna RaiderJeffery  Chang M.D.   On: 01/04/2015 19:48    EKG:   Orders placed or performed during the hospital encounter of 01/04/15  . ED EKG  . ED EKG  . EKG 12-Lead  . EKG 12-Lead    ASSESSMENT AND PLAN:    #HCAP (healthcare-associated pneumonia) - suspicion of pneumonia right lower lobe, - as the patient lives in a nursing facility this must be designated as healthcare associated pneumonia. - we will continue IV vancomycin and Zosyn  -Repeat chest x-ray in a.m. - We'll follow up on sputum culture   blood cultures negative so far  #Acute depression with suicidal ideation and anxiety Patient is made IVC One-on-one observation for safety Appreciate psychiatric recommendations. Recommending to continue current medications as SSRIs will take 4-6 weeks to act. Follow-up with psychiatry, considering [facility] medical clearance    #Chronic systolic CHF (congestive heart failure) (HCC) - not fluid overloaded at this time  continue home meds  # HTN (hypertension) -  normotensive here  hold some home antihypertensives for now, can be restarted if needed.  # Chronic a-fib (HCC) - rate controlled at this time continue rate controlling medications including digoxin Check dig  Levels    #Dementia - on Seroquel, hold for now as patient is not having behavioral disturbance and QTC was 476   #GERD (gastroesophageal reflux disease) - home dose PPI   #Anxiety - continue home meds Xanax will be provided as needed basis. Dose is increased to 0.5 mg    #COPD (HCC) - do nebs when necessary, continue other home meds   BPH (benign prostatic hyperplasia) - continue home meds  #Insomnia provide Ambien daily at bedtime as needed    All the records are reviewed and case discussed with Care Management/Social Workerr. Management plans discussed with the patient, family and they are in agreement.  CODE STATUS: DNR  TOTAL TIME TAKING CARE OF THIS PATIENT: 35 minutes.   POSSIBLE D/C IN 2-3 DAYS, DEPENDING ON CLINICAL CONDITION.   Ramonita LabGouru, Tollie Canada M.D on 01/06/2015 at 1:35 PM  Between 7am to 6pm - Pager - [phone] After 6pm go to [email or web address] - password [email or web address] [email or web address]  [facility]  Office  [phone]  CC: Primary care physician; Lyndon CodeKHAN, FOZIA M, MD

## 2015-01-06 NOTE — Progress Notes (Signed)
ANTIBIOTIC CONSULT NOTE - FOLLOW UP  Pharmacy Consult for vancomycin Indication: pneumonia  No Known Allergies  Patient Measurements: Height: 5\' 9"  (175.3 cm) Weight: 154 lb 6.4 oz (70.035 kg) IBW/kg (Calculated) : 70.7  Vital Signs: Temp: 97.7 F (36.5 C) (11/13 1911) Temp Source: Oral (11/13 1911) BP: 142/75 mmHg (11/13 1911) Pulse Rate: 61 (11/13 1911) Intake/Output from previous day: 11/12 0701 - 11/13 0700 In: 480 [P.O.:480] Out: 1500 [Urine:1500] Intake/Output from this shift: Total I/O In: -  Out: 175 [Urine:175]  Labs:  Recent Labs  01/04/15 1907 01/05/15 0529 01/06/15 0654  WBC 7.8 7.4 7.8  HGB 12.3* 12.2* 12.2*  PLT 160 155 195  CREATININE 0.97 0.92 1.00   Estimated Creatinine Clearance: 61.3 mL/min (by C-G formula based on Cr of 1).  Recent Labs  01/06/15 1930  VANCOTROUGH 12     Microbiology: Recent Results (from the past 720 hour(s))  Blood culture (routine x 2)     Status: None (Preliminary result)   Collection Time: 01/04/15  8:15 PM  Result Value Ref Range Status   Specimen Description BLOOD LEFT ARM  Final   Special Requests BOTTLES DRAWN AEROBIC AND ANAEROBIC 4CC  Final   Culture NO GROWTH 2 DAYS  Final   Report Status PENDING  Incomplete  Blood culture (routine x 2)     Status: None (Preliminary result)   Collection Time: 01/04/15  8:20 PM  Result Value Ref Range Status   Specimen Description BLOOD RIGHT ARM  Final   Special Requests BOTTLES DRAWN AEROBIC AND ANAEROBIC 4CC  Final   Culture NO GROWTH 2 DAYS  Final   Report Status PENDING  Incomplete  MRSA PCR Screening     Status: None   Collection Time: 01/05/15  1:23 AM  Result Value Ref Range Status   MRSA by PCR NEGATIVE NEGATIVE Final    Comment:        The GeneXpert MRSA Assay (FDA approved for NASAL specimens only), is one component of a comprehensive MRSA colonization surveillance program. It is not intended to diagnose MRSA infection nor to guide or monitor  treatment for MRSA infections.     Anti-infectives    Start     Dose/Rate Route Frequency Ordered Stop   01/06/15 2100  vancomycin (VANCOCIN) IVPB 1000 mg/200 mL premix     1,000 mg 200 mL/hr over 60 Minutes Intravenous Every 12 hours 01/06/15 2033     01/05/15 0800  vancomycin (VANCOCIN) IVPB 750 mg/150 ml premix  Status:  Discontinued     750 mg 150 mL/hr over 60 Minutes Intravenous Every 12 hours 01/04/15 2228 01/06/15 2033   01/04/15 2300  vancomycin (VANCOCIN) IVPB 750 mg/150 ml premix     750 mg 150 mL/hr over 60 Minutes Intravenous  Once 01/04/15 2228 01/05/15 0015   01/04/15 2300  piperacillin-tazobactam (ZOSYN) IVPB 3.375 g     3.375 g 12.5 mL/hr over 240 Minutes Intravenous 3 times per day 01/04/15 2228     01/04/15 2000  cefTRIAXone (ROCEPHIN) 1 g in dextrose 5 % 50 mL IVPB     1 g 100 mL/hr over 30 Minutes Intravenous  Once 01/04/15 1958 01/04/15 2107   01/04/15 2000  azithromycin (ZITHROMAX) 500 mg in dextrose 5 % 250 mL IVPB     500 mg 250 mL/hr over 60 Minutes Intravenous  Once 01/04/15 1958 01/04/15 2208      Assessment: Pharmacy dosing vancomycin in this 79 year old male being treated for HCAP. Vancomycin trough  was drawn this evening prior to 5th dose and was subtherapeutic at 12 mcg/mL on a 750 mg IV q12h regimen.  Goal of Therapy:  Vancomycin trough level 15-20 mcg/ml  Plan:  Measure antibiotic drug levels at steady state Follow up culture results   Increase vancomycin to 1000 mg IV q12h Patient is not obese, so don't anticipate accumulation Vancomycin trough ordered on 11/15 at 20:30 which is prior to 5th dose and should represent steady state.  Pharmacy will continue to follow.  Jodelle Red Audreyanna Butkiewicz 01/06/2015,8:36 PM

## 2015-01-06 NOTE — Plan of Care (Signed)
Problem: Coping: Goal: Verbalizations of decreased anxiety will increase Outcome: Not Applicable Date Met:  00/34/91 Agitated at times

## 2015-01-07 ENCOUNTER — Inpatient Hospital Stay: Payer: Medicare Other

## 2015-01-07 LAB — BASIC METABOLIC PANEL
ANION GAP: 5 (ref 5–15)
BUN: 11 mg/dL (ref 6–20)
CHLORIDE: 99 mmol/L — AB (ref 101–111)
CO2: 35 mmol/L — ABNORMAL HIGH (ref 22–32)
Calcium: 8.8 mg/dL — ABNORMAL LOW (ref 8.9–10.3)
Creatinine, Ser: 0.94 mg/dL (ref 0.61–1.24)
GFR calc non Af Amer: >60 mL/min (ref >60)
Glucose, Bld: 121 mg/dL — ABNORMAL HIGH (ref 65–99)
POTASSIUM: 4 mmol/L (ref 3.5–5.1)
SODIUM: 139 mmol/L (ref 135–145)

## 2015-01-07 LAB — CBC
HCT: 39.4 % — ABNORMAL LOW (ref 40.0–52.0)
HEMOGLOBIN: 13.2 g/dL (ref 13.0–18.0)
MCH: 28.1 pg (ref 26.0–34.0)
MCHC: 33.5 g/dL (ref 32.0–36.0)
MCV: 83.6 fL (ref 80.0–100.0)
Platelets: 188 10*3/uL (ref 150–440)
RBC: 4.72 MIL/uL (ref 4.40–5.90)
RDW: 16.5 % — ABNORMAL HIGH (ref 11.5–14.5)
WBC: 8.2 10*3/uL (ref 3.8–10.6)

## 2015-01-07 MED ORDER — METOPROLOL TARTRATE 25 MG PO TABS
25.0000 mg | ORAL_TABLET | Freq: Two times a day (BID) | ORAL | Status: DC
  Administered 2015-01-07 – 2015-01-09 (×4): 25 mg via ORAL
  Filled 2015-01-07 (×4): qty 1

## 2015-01-07 NOTE — Care Management Important Message (Signed)
Important Message  Patient Details  Name: Darrell Mcconnell MRN: [id number] Date of Birth: 12/21/1935   Medicare Important Message Given:  Yes    [name], Emira Eubanks L, RN 01/07/2015, 10:32 AM

## 2015-01-08 ENCOUNTER — Inpatient Hospital Stay: Payer: Medicare Other

## 2015-01-08 DIAGNOSIS — J189 Pneumonia, unspecified organism: Principal | ICD-10-CM

## 2015-01-08 LAB — EXPECTORATED SPUTUM ASSESSMENT W REFEX TO RESP CULTURE

## 2015-01-08 LAB — EXPECTORATED SPUTUM ASSESSMENT W GRAM STAIN, RFLX TO RESP C

## 2015-01-08 MED ORDER — IOHEXOL 300 MG/ML  SOLN
75.0000 mL | Freq: Once | INTRAMUSCULAR | Status: AC | PRN
  Administered 2015-01-08: 75 mL via INTRAVENOUS

## 2015-01-08 MED ORDER — AMOXICILLIN-POT CLAVULANATE 875-125 MG PO TABS
1.0000 | ORAL_TABLET | Freq: Two times a day (BID) | ORAL | Status: DC
  Administered 2015-01-08 – 2015-01-09 (×2): 1 via ORAL
  Filled 2015-01-08 (×2): qty 1

## 2015-01-08 MED ORDER — DIPHENHYDRAMINE HCL 25 MG PO CAPS
25.0000 mg | ORAL_CAPSULE | Freq: Once | ORAL | Status: AC
  Administered 2015-01-08: 25 mg via ORAL
  Filled 2015-01-08: qty 1

## 2015-01-08 NOTE — Progress Notes (Signed)
[facility] - [place] at [facility]   PATIENT NAME: Darrell Mcconnell    MR#:  [id number]  DATE OF BIRTH:  09-Jul-1935  SUBJECTIVE:   Very anxious today, denies shortness of breath , cough. His main concern is not able to sleep in at night.  REVIEW OF SYSTEMS:  CONSTITUTIONAL: No fever, fatigue or weakness.  EYES: No blurred or double vision.  EARS, NOSE, AND THROAT: No tinnitus or ear pain.  RESPIRATORY: No cough, shortness of breath, wheezing or hemoptysis.  CARDIOVASCULAR: No chest pain, orthopnea, edema.  GASTROINTESTINAL: No nausea, vomiting, diarrhea or abdominal pain.  GENITOURINARY: No dysuria, hematuria.  ENDOCRINE: No polyuria, nocturia,  HEMATOLOGY: No anemia, easy bruising or bleeding SKIN: No rash or lesion. MUSCULOSKELETAL: No joint pain or arthritis.   NEUROLOGIC: No tingling, numbness, weakness.  PSYCHIATRY: Has   anxiety ,   DRUG ALLERGIES:  No Known Allergies  VITALS:  Blood pressure 142/82, pulse 69, temperature 97.7 F (36.5 C), temperature source Oral, resp. rate 12, height 5\' 9"  (1.753 m), weight 66.815 kg (147 lb 4.8 oz), SpO2 97 %.  PHYSICAL EXAMINATION:  GENERAL:  79 y.o.-year-old patient lying in the bed with no acute distress. But appears somewhat anxious. EYES: Pupils equal, round, reactive to light and accommodation. No scleral icterus.  HEENT: Head atraumatic, normocephalic. Oropharynx and nasopharynx clear.  NECK:  Supple, no jugular venous distention. No thyroid enlargement, no tenderness.  LUNGS: Normal breath sounds bilaterally, no wheezing, rales,rhonchi or crepitation. No use of accessory muscles of respiration. Uses 2 L supplemental oxygen via nasal cannula. CARDIOVASCULAR: S1, S2 normal. No murmurs, rubs, or gallops.  ABDOMEN: Soft, nontender, nondistended. Bowel sounds present. No organomegaly or mass.  EXTREMITIES: No pedal edema, cyanosis, or clubbing.  NEUROLOGIC: Cranial nerves II through XII are intact. Muscle  strength grossly intact. Sensation intact. Gait not checked. Follows command. PSYCHIATRIC: The patient is alert and oriented x 2. Very anxious SKIN: No obvious rash, lesion, or ulcer.    LABORATORY PANEL:   CBC  Recent Labs Lab 01/07/15 0613  WBC 8.2  HGB 13.2  HCT 39.4*  PLT 188   ------------------------------------------------------------------------------------------------------------------  Chemistries   Recent Labs Lab 01/04/15 1907  01/07/15 0613  NA 141  < > 139  K 3.6  < > 4.0  CL 104  < > 99*  CO2 32  < > 35*  GLUCOSE 116*  < > 121*  BUN 17  < > 11  CREATININE 0.97  < > 0.94  CALCIUM 8.8*  < > 8.8*  AST 20  --   --   ALT 13*  --   --   ALKPHOS 79  --   --   BILITOT 0.1*  --   --   < > = values in this interval not displayed. ------------------------------------------------------------------------------------------------------------------  Cardiac Enzymes  Recent Labs Lab 01/04/15 1907  TROPONINI <0.03   ------------------------------------------------------------------------------------------------------------------  RADIOLOGY:  Dg Chest 2 View  01/07/2015  CLINICAL DATA:  Shortness of breath. History of COPD. Right lower pleuritic chest pain with deep breathing. Fevers and chills. EXAM: CHEST  2 VIEW COMPARISON:  01/04/2015 and 11/19/2014 and 01/29/2014 FINDINGS: There continues to be densities at the right lung base suggestive for volume loss and pleural fluid. Ill-defined densities in the right lower chest may also represent airspace disease. There has been persistent volume loss at the right lung base since 11/19/2014 which raises concern for endobronchial disease. Left lung remains clear. Heart and mediastinum are stable with post  CABG changes. IMPRESSION: Pleural and parenchymal densities at the right lung base. Findings are suggestive for volume loss, right pleural fluid and likely airspace disease. There has been volume loss since 11/19/2014 and  this raises concern for endobronchial disease. Recommend further evaluation with a chest CT with intravenous contrast. Electronically Signed   By: Richarda Overlie M.D.   On: 01/07/2015 08:19   Ct Chest W Contrast  01/08/2015  CLINICAL DATA:  79 year old male with confluent abnormal opacity at the right lung base new since May. COPD. Increasing shortness of breath. Initial encounter. EXAM: CT CHEST WITH CONTRAST TECHNIQUE: Multidetector CT imaging of the chest was performed during intravenous contrast administration. CONTRAST:  75mL OMNIPAQUE IOHEXOL 300 MG/ML  SOLN COMPARISON:  Chest radiographs 01/07/2015 and earlier. Chest CTA 12/20/2013. CT Abdomen and Pelvis 07/04/2014 FINDINGS: Moderate size layering right pleural effusion is new since the May CT Abdomen and Pelvis. No pericardial effusion. Small mediastinal lymph nodes are stable and within normal limits. No hilar lymphadenopathy. Major airways are patent. Centrilobular emphysema. There is right lower lobe medial and posterior basal segment consolidation in addition to compressive right lung atelectasis. No obstructing mass is evident. Left lower lobe atelectasis has regressed since the prior CTA. Residual atelectasis along the chronically dolichoectatic thoracic aorta. Sequelae of median sternotomy. Chronic aortic and coronary artery calcified plaque. Proximal great vessels are patent. Chronic fusiform enlargement of the thoracic aorta, stable since 2015 measuring about 40 mm diameter throughout the arch and descending segment. The ascending aorta measures about 42 mm diameter and likewise appears stable. No thoracic aortic dissection. 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 This is minimally increased from the prior CTs (49-50 mm at that time). No axillary lymphadenopathy. Stable visualized upper abdominal viscera. No acute osseous abnormality identified. IMPRESSION:  1. Moderate layering right pleural effusion new since May. Mostly atelectasis is associated but there is consolidation of the medial and posterior basal segments of the right lower lobe. No obstructing mass is evident. No mediastinal or hilar lymphadenopathy. But consider bronchoscopic evaluation if this persists on follow-up exams. 2. Emphysema. 3. Advanced atherosclerosis and generalized aortic dolichoectasia. Maximal caliber of the visible aorta at 51 mm (juxta diaphragmatic) has mildly increased since October 2015 (49 mm at that time). Of note, the large infrarenal aortic aneurysm seen in May is not visible today. Electronically Signed   By: Odessa Fleming M.D.   On: 01/08/2015 10:13     ASSESSMENT AND PLAN:    #HCAP (healthcare-associated pneumonia) - suspicion of pneumonia right lower lobe, - as the patient lives in a nursing facility this must be designated as healthcare associated pneumonia. -Given IV vancomycin and Zosyn  -  blood cultures negative so far - As he is improving change to oral Augmentin. - We may give total 7-10 days of therapy.  #Acute depression with suicidal ideation and anxiety Patient is made IVC One-on-one observation for safety Appreciate psychiatric recommendations. Recommending to continue current medications as SSRIs will take 4-6 weeks to act. Follow-up with psychiatry, considering to [facility] the patient to [facility] after medical clearance Patient is medically stable, but as he is on baseline home oxygen 2 L nasal cannula, he may not be afraid to go to behavioral medicine floor- as per my discussion with Dr. Toni Amend.  He'll come and follow the patient on medical floor, and if psychiatrically cleared as the patient for discharge then he may be discharged back to his  nursing facility.   #Chronic systolic CHF (congestive heart failure) (HCC) - not fluid overloaded at this time  continue home meds  # HTN (hypertension) - normotensive here Increased dose of metoprolol due  to tachycardia and Hypertension.  Now better controlled.  # Chronic a-fib (HCC) - rate controlled at this time continue rate controlling medications including digoxin On metoprolol.    #Dementia - on Seroquel, hold for now as patient is not having behavioral disturbance and QTC was 476   #GERD (gastroesophageal reflux disease) - home dose PPI   #Anxiety - continue home meds Xanax will be provided as needed basis. Dose is increased to 0.5 mg   #COPD (HCC) - do nebs when necessary, continue other home meds  # BPH (benign prostatic hyperplasia) - continue home meds  #Insomnia provide Ambien daily at bedtime as needed, give one dose of benadryl.    All the records are reviewed and case discussed with Care Management/Social Workerr. Management plans discussed with the patient, family and they are in agreement.  CODE STATUS: DNR  TOTAL TIME TAKING CARE OF THIS PATIENT: 35 minutes.   POSSIBLE D/C IN 2-3 DAYS, DEPENDING ON CLINICAL CONDITION.   Altamese Dilling M.D on 01/08/2015 at 5:56 PM  Between 7am to 6pm - Pager - [phone] After 6pm go to [email or web address] - password [email or web address] [facility]  [place] [facility]  Office  [phone]  CC: Primary care physician; Lyndon Code, MD

## 2015-01-08 NOTE — Progress Notes (Signed)
[facility] [place] [facility]   PATIENT NAME: Darrell Mcconnell    MR#:  [id number]  DATE OF BIRTH:  1935/03/10  SUBJECTIVE:  CHIEF COMPLAINT:  Patient is very hard of hearing. Denies any shortness of breath today. Feeling very anxious and was unable to sleep last 3 night. Requesting for sleep medicine. He has tachycardia.  REVIEW OF SYSTEMS:  CONSTITUTIONAL: No fever, fatigue or weakness.  EYES: No blurred or double vision.  EARS, NOSE, AND THROAT: No tinnitus or ear pain.  RESPIRATORY: No cough, shortness of breath, wheezing or hemoptysis.  CARDIOVASCULAR: No chest pain, orthopnea, edema.  GASTROINTESTINAL: No nausea, vomiting, diarrhea or abdominal pain.  GENITOURINARY: No dysuria, hematuria.  ENDOCRINE: No polyuria, nocturia,  HEMATOLOGY: No anemia, easy bruising or bleeding SKIN: No rash or lesion. MUSCULOSKELETAL: No joint pain or arthritis.   NEUROLOGIC: No tingling, numbness, weakness.  PSYCHIATRY: Has   anxiety , active  depression.   DRUG ALLERGIES:  No Known Allergies  VITALS:  Blood pressure 118/75, pulse 73, temperature 97.5 F (36.4 C), temperature source Oral, resp. rate 20, height 5\' 9"  (1.753 m), weight 66.815 kg (147 lb 4.8 oz), SpO2 98 %.  PHYSICAL EXAMINATION:  GENERAL:  79 y.o.-year-old patient lying in the bed with no acute distress.  EYES: Pupils equal, round, reactive to light and accommodation. No scleral icterus.  HEENT: Head atraumatic, normocephalic. Oropharynx and nasopharynx clear.  NECK:  Supple, no jugular venous distention. No thyroid enlargement, no tenderness.  LUNGS: Normal breath sounds bilaterally, no wheezing, rales,rhonchi or crepitation. No use of accessory muscles of respiration.  CARDIOVASCULAR: S1, S2 normal. No murmurs, rubs, or gallops.  ABDOMEN: Soft, nontender, nondistended. Bowel sounds present. No organomegaly or mass.  EXTREMITIES: No pedal edema, cyanosis, or clubbing.  NEUROLOGIC: Cranial nerves II  through XII are intact. Muscle strength grossly intact. Sensation intact. Gait not checked.  PSYCHIATRIC: The patient is alert and oriented x 2. Very anxious SKIN: No obvious rash, lesion, or ulcer.    LABORATORY PANEL:   CBC  Recent Labs Lab 01/07/15 0613  WBC 8.2  HGB 13.2  HCT 39.4*  PLT 188   ------------------------------------------------------------------------------------------------------------------  Chemistries   Recent Labs Lab 01/04/15 1907  01/07/15 0613  NA 141  < > 139  K 3.6  < > 4.0  CL 104  < > 99*  CO2 32  < > 35*  GLUCOSE 116*  < > 121*  BUN 17  < > 11  CREATININE 0.97  < > 0.94  CALCIUM 8.8*  < > 8.8*  AST 20  --   --   ALT 13*  --   --   ALKPHOS 79  --   --   BILITOT 0.1*  --   --   < > = values in this interval not displayed. ------------------------------------------------------------------------------------------------------------------  Cardiac Enzymes  Recent Labs Lab 01/04/15 1907  TROPONINI <0.03   ------------------------------------------------------------------------------------------------------------------  RADIOLOGY:  Dg Chest 2 View  01/07/2015  CLINICAL DATA:  Shortness of breath. History of COPD. Right lower pleuritic chest pain with deep breathing. Fevers and chills. EXAM: CHEST  2 VIEW COMPARISON:  01/04/2015 and 11/19/2014 and 01/29/2014 FINDINGS: There continues to be densities at the right lung base suggestive for volume loss and pleural fluid. Ill-defined densities in the right lower chest may also represent airspace disease. There has been persistent volume loss at the right lung base since 11/19/2014 which raises concern for endobronchial disease. Left lung remains clear. Heart and mediastinum  are stable with post CABG changes. IMPRESSION: Pleural and parenchymal densities at the right lung base. Findings are suggestive for volume loss, right pleural fluid and likely airspace disease. There has been volume loss since  11/19/2014 and this raises concern for endobronchial disease. Recommend further evaluation with a chest CT with intravenous contrast. Electronically Signed   By: Richarda Overlie M.D.   On: 01/07/2015 08:19     ASSESSMENT AND PLAN:    #HCAP (healthcare-associated pneumonia) - suspicion of pneumonia right lower lobe, - as the patient lives in a nursing facility this must be designated as healthcare associated pneumonia. - we will continue IV vancomycin and Zosyn  -  blood cultures negative so far - may taper Abx soon.  #Acute depression with suicidal ideation and anxiety Patient is made IVC One-on-one observation for safety Appreciate psychiatric recommendations. Recommending to continue current medications as SSRIs will take 4-6 weeks to act. Follow-up with psychiatry, considering to transfer the patient to [facility] after medical clearance Likely tomorrow.   #Chronic systolic CHF (congestive heart failure) (HCC) - not fluid overloaded at this time  continue home meds  # HTN (hypertension) - normotensive here Increased dose of metoprolol due to tachycardia and Hypertension.  # Chronic a-fib (HCC) - rate controlled at this time continue rate controlling medications including digoxin     #Dementia - on Seroquel, hold for now as patient is not having behavioral disturbance and QTC was 476   #GERD (gastroesophageal reflux disease) - home dose PPI   #Anxiety - continue home meds Xanax will be provided as needed basis. Dose is increased to 0.5 mg   #COPD (HCC) - do nebs when necessary, continue other home meds  # BPH (benign prostatic hyperplasia) - continue home meds  #Insomnia provide Ambien daily at bedtime as needed    All the records are reviewed and case discussed with Care Management/Social Workerr. Management plans discussed with the patient, family and they are in agreement.  CODE STATUS: DNR  TOTAL TIME TAKING CARE OF THIS PATIENT: 35 minutes.   POSSIBLE D/C IN 2-3  DAYS, DEPENDING ON CLINICAL CONDITION.   Altamese Dilling M.D on 01/08/2015 at 8:34 AM  Between 7am to 6pm - Pager - [phone] After 6pm go to [email or web address] - password [email or web address] [facility]  [place]  Office  [phone]  CC: Primary care physician; Lyndon Code, MD

## 2015-01-08 NOTE — Consult Note (Signed)
[place] Psychiatry Consult   Reason for Consult:  Consult follow-up for this 79 year old gentleman currently in the hospital for pneumonia who was being followed by psychiatry for depression Referring Physician:  Anselm Jungling Patient Identification: Darrell Mcconnell MRN:  [id number] Principal Diagnosis: Major depression moderate recurrent Diagnosis:   Patient Active Problem List   Diagnosis Date Noted  . Chronic systolic CHF (congestive heart failure) ([place]) [I50.22] 01/04/2015  . GERD (gastroesophageal reflux disease) [K21.9] 01/04/2015  . HTN (hypertension) [I10] 01/04/2015  . Anxiety [F41.9] 01/04/2015  . Chronic a-fib ([place]) [I48.2] 01/04/2015  . Dementia [F03.90] 01/04/2015  . COPD (chronic obstructive pulmonary disease) ([place]) [J44.9] 01/04/2015  . BPH (benign prostatic hyperplasia) [N40.0] 01/04/2015  . HCAP (healthcare-associated pneumonia) [J18.9] 07/04/2014  . COPD exacerbation ([place]) [J44.1] 07/04/2014  . Aneurysm of aorta (HCC) [I71.9] 07/04/2014    Total Time spent with patient: 45 minutes  Subjective:   Darrell Mcconnell is a 79 y.o. male patient admitted with "I believe I'm feeling better".  HPI:  Information from the patient in interview today, also from review of prior consult notes and the chart, review of labs and medications. Patient tells me that today this afternoon he is feeling a little better. He was feeling more down earlier today but for whatever reason he feels better this afternoon. He feels optimistic and hopeful about his mental health condition and his health in general. He admits that he had been feeling depressed since coming into the hospital year. He denies that he was feeling depressed back at his rest home. He says that he had been sleeping poorly since he had been sick and his appetite had been poor but both of those things seem to be improving. Patient admits that he has had some passing thoughts about killing himself but tells me today very clearly that he would  never act on it. He states that he has religious beliefs against killing himself and believes he would never any more try and hurt himself. He has been compliant with treatment here in the hospital. Patient had been treated for depression throughout his hospital stay. Dr. Dillard Cannon saw him earlier and at that time apparently he was expressing more concerning suicidal ideation. The patient denies to me that he is having any psychotic symptoms.  Social history: Patient lives at a retirement home. His wife of over 20 years died 2 years ago. He is still mourning that. However he sounds a key has a good positive connection to his son and grandchildren.  Medical history: Currently in the hospital for pneumonia he has COPD, hypertension, atrial fibrillation gastric reflux symptoms CHF  Substance abuse history: None reported  Past Psychiatric History: Patient had one hospitalization at [facility] several years ago. He claims that it happened because he "went out of his head" when he was taken off some medicine. He admits that he tried to kill himself by cutting his wrists. He has not ever repeated that. No other psychiatric hospitalizations.  Risk to Self: Is patient at risk for suicide?: No Risk to Others:   Prior Inpatient Therapy:   Prior Outpatient Therapy:    Past Medical History:  Past Medical History  Diagnosis Date  . Chronic systolic CHF (congestive heart failure) ([employer])   . COPD (chronic obstructive pulmonary disease) ([place])   . Aorta aneurysm ([place])   . Asthma   . Hypertension   . Anxiety   . A-fib ([place])   . Depression   . Hypothyroidism   . GERD (gastroesophageal  reflux disease)   . Dementia   . BPH (benign prostatic hyperplasia)     Past Surgical History  Procedure Laterality Date  . Coronary artery bypass graft    . Prostate surgery    . Abdominal aortic aneurysm repair     Family History:  Family History  Problem Relation Age of Onset  . CAD Father    Family  Psychiatric  History: Patient denies there being any family history of mental health or substance abuse problems Social History:  History  Alcohol Use No     History  Drug Use No    Social History   Social History  . Marital Status: Widowed    Spouse Name: N/A  . Number of Children: N/A  . Years of Education: N/A   Social History Main Topics  . Smoking status: Former Research scientist (life sciences)  . Smokeless tobacco: None  . Alcohol Use: No  . Drug Use: No  . Sexual Activity: Not Asked   Other Topics Concern  . None   Social History Narrative   Additional Social History:                          Allergies:  No Known Allergies  Labs:  Results for orders placed or performed during the hospital encounter of 01/04/15 (from the past 48 hour(s))  Vancomycin, trough     Status: None   Collection Time: 01/06/15  7:30 PM  Result Value Ref Range   Vancomycin Tr 12 10 - 20 ug/mL  CBC     Status: Abnormal   Collection Time: 01/07/15  6:13 AM  Result Value Ref Range   WBC 8.2 3.8 - 10.6 K/uL   RBC 4.72 4.40 - 5.90 MIL/uL   Hemoglobin 13.2 13.0 - 18.0 g/dL   HCT 39.4 (L) 40.0 - 52.0 %   MCV 83.6 80.0 - 100.0 fL   MCH 28.1 26.0 - 34.0 pg   MCHC 33.5 32.0 - 36.0 g/dL   RDW 16.5 (H) 11.5 - 14.5 %   Platelets 188 150 - 440 K/uL  Basic metabolic panel     Status: Abnormal   Collection Time: 01/07/15  6:13 AM  Result Value Ref Range   Sodium 139 135 - 145 mmol/L   Potassium 4.0 3.5 - 5.1 mmol/L   Chloride 99 (L) 101 - 111 mmol/L   CO2 35 (H) 22 - 32 mmol/L   Glucose, Bld 121 (H) 65 - 99 mg/dL   BUN 11 6 - 20 mg/dL   Creatinine, Ser 0.94 0.61 - 1.24 mg/dL   Calcium 8.8 (L) 8.9 - 10.3 mg/dL   GFR calc non Af Amer >60 >60 mL/min   GFR calc Af Amer >60 >60 mL/min    Comment: (NOTE) The eGFR has been calculated using the CKD EPI equation. This calculation has not been validated in all clinical situations. eGFR's persistently <60 mL/min signify possible Chronic Kidney Disease.     Anion gap 5 5 - 15  Culture, sputum-assessment     Status: None   Collection Time: 01/08/15  6:55 AM  Result Value Ref Range   Specimen Description SPU    Special Requests NONE    Sputum evaluation      Sputum specimen not acceptable for testing.  Please recollect.   Results Called to: JESSICA CHRISTMAS AT 1030 01/08/15 DV    Report Status 01/08/2015 FINAL     Current Facility-Administered Medications  Medication Dose Route Frequency Provider  Last Rate Last Dose  . acetaminophen (TYLENOL) tablet 650 mg  650 mg Oral Q6H PRN Lance Coon, MD   650 mg at 01/08/15 1402   Or  . acetaminophen (TYLENOL) suppository 650 mg  650 mg Rectal Q6H PRN Lance Coon, MD      . ALPRAZolam Duanne Moron) tablet 0.5 mg  0.5 mg Oral Q8H PRN Nicholes Mango, MD   0.5 mg at 01/08/15 1611  . aspirin EC tablet 81 mg  81 mg Oral QPM Lance Coon, MD   81 mg at 01/08/15 1611  . brimonidine (ALPHAGAN) 0.2 % ophthalmic solution 1 drop  1 drop Both Eyes BID Nicholes Mango, MD   1 drop at 01/08/15 0857   And  . timolol (TIMOPTIC) 0.5 % ophthalmic solution 1 drop  1 drop Both Eyes BID Nicholes Mango, MD   1 drop at 01/08/15 0858  . digoxin (LANOXIN) tablet 0.125 mg  0.125 mg Oral Daily Lance Coon, MD   0.125 mg at 01/08/15 0857  . dorzolamide (TRUSOPT) 2 % ophthalmic solution 1 drop  1 drop Both Eyes BID Lance Coon, MD   1 drop at 01/08/15 0857  . enoxaparin (LOVENOX) injection 40 mg  40 mg Subcutaneous Q24H Lance Coon, MD   40 mg at 01/07/15 2102  . escitalopram (LEXAPRO) tablet 15 mg  15 mg Oral Daily Lance Coon, MD   15 mg at 01/08/15 0856  . finasteride (PROSCAR) tablet 5 mg  5 mg Oral Daily Lance Coon, MD   5 mg at 01/08/15 0856  . furosemide (LASIX) tablet 40 mg  40 mg Oral Daily Lance Coon, MD   40 mg at 01/08/15 0857  . ipratropium-albuterol (DUONEB) 0.5-2.5 (3) MG/3ML nebulizer solution 3 mL  3 mL Nebulization Q4H PRN Lance Coon, MD   3 mL at 01/08/15 1200  . levothyroxine (SYNTHROID, LEVOTHROID) tablet 25 mcg   25 mcg Oral QAC breakfast Lance Coon, MD   25 mcg at 01/08/15 0857  . metoprolol tartrate (LOPRESSOR) tablet 25 mg  25 mg Oral BID Vaughan Basta, MD   25 mg at 01/08/15 0857  . nitroGLYCERIN (NITROSTAT) SL tablet 0.4 mg  0.4 mg Sublingual Q5 min PRN Max Sane, MD   0.4 mg at 01/06/15 0547  . ondansetron (ZOFRAN) tablet 4 mg  4 mg Oral Q6H PRN Lance Coon, MD       Or  . ondansetron [facility]) injection 4 mg  4 mg Intravenous Q6H PRN Lance Coon, MD      . pantoprazole (PROTONIX) EC tablet 40 mg  40 mg Oral QAC breakfast Lance Coon, MD   40 mg at 01/08/15 0857  . piperacillin-tazobactam (ZOSYN) IVPB 3.375 g  3.375 g Intravenous 3 times per day Nicholes Mango, MD   3.375 g at 01/08/15 1610  . senna-docusate (Senokot-S) tablet 1 tablet  1 tablet Oral QHS PRN Lance Coon, MD      . sodium chloride 0.9 % injection 3 mL  3 mL Intravenous Q12H Lance Coon, MD   3 mL at 01/08/15 0858  . vancomycin (VANCOCIN) IVPB 1000 mg/200 mL premix  1,000 mg Intravenous Q12H Lenis Noon, RPH   1,000 mg at 01/08/15 1017  . zolpidem (AMBIEN) tablet 5 mg  5 mg Oral QHS PRN Nicholes Mango, MD   5 mg at 01/07/15 2102    Musculoskeletal: Strength & Muscle Tone: decreased Gait & Station: unsteady Patient leans: N/A  Psychiatric Specialty Exam: Review of Systems  HENT: Negative.   Eyes: Negative.  Respiratory: Positive for shortness of breath.   Cardiovascular: Negative.   Gastrointestinal: Negative.   Musculoskeletal: Negative.   Skin: Negative.   Neurological: Positive for weakness.  Psychiatric/Behavioral: Positive for depression. Negative for suicidal ideas, hallucinations, memory loss and substance abuse. The patient has insomnia. The patient is not nervous/anxious.     Blood pressure 142/82, pulse 69, temperature 97.7 F (36.5 C), temperature source Oral, resp. rate 12, height '5\' 9"'  (1.753 m), weight 66.815 kg (147 lb 4.8 oz), SpO2 97 %.Body mass index is 21.74 kg/(m^2).  General Appearance:  Disheveled  Eye Contact::  Good  Speech:  Slow  Volume:  Normal  Mood:  Euthymic  Affect:  Appropriate  Thought Process:  Intact  Orientation:  Full (Time, Place, and Person)  Thought Content:  Negative  Suicidal Thoughts:  No  Homicidal Thoughts:  No  Memory:  Immediate;   Good Recent;   Fair Remote;   Fair  Judgement:  Intact  Insight:  Fair  Psychomotor Activity:  Normal  Concentration:  Good  Recall:  [employer] of Knowledge:Fair  Language: Fair  Akathisia:  No  Handed:  Right  AIMS (if indicated):     Assets:  Communication Skills Desire for Improvement Housing Resilience  ADL's:  Intact  Cognition: WNL  Sleep:      Treatment Plan Summary: Medication management and Plan This is a 79 year old man with a history of depression who had been reporting more symptoms of depression earlier in his hospital stay. On interview today with me the patient appears to be clear and lucid in his thinking. His memory appears to be intact. He denies having any plan or intention or wish to kill himself. He is able to articulate logical positive reasons not to do that. He has been compliant with treatment. At this point I do not think there would be of benefit from referring him to a psychiatric inpatient unit. I am going to discontinue the sitter and suicide precautions. Patient is to continue his Lexapro. He will follow-up with outpatient treatment along with his other medical treatment at discharge. I will follow-up with him while he is in the hospital.  Disposition: Patient does not meet criteria for psychiatric inpatient admission. Supportive therapy provided about ongoing stressors.  Darrell Mcconnell 01/08/2015 5:28 PM

## 2015-01-09 LAB — CULTURE, BLOOD (ROUTINE X 2)
CULTURE: NO GROWTH
CULTURE: NO GROWTH

## 2015-01-09 MED ORDER — AMOXICILLIN-POT CLAVULANATE 875-125 MG PO TABS: 1.0000 | ORAL_TABLET | Freq: Two times a day (BID) | ORAL | Status: DC

## 2015-01-09 NOTE — Care Management Important Message (Signed)
Important Message  Patient Details  Name: Darrell Mcconnell MRN: [id number] Date of Birth: 06-25-1935   Medicare Important Message Given:  Yes    [name], Fredonia Casalino L, RN 01/09/2015, 10:39 AM

## 2015-01-09 NOTE — NC FL2 (Signed)
[place] MEDICAID FL2 LEVEL OF CARE SCREENING TOOL     IDENTIFICATION  Patient Name: Darrell Mcconnell Birthdate: 1935/10/07 Sex: male Admission Date (Current Location): 01/04/2015  [facility] and [place] Number:     Facility and Address:  [facility], [address] [place], [place] [address]      Provider Number: [id number]  Attending Physician Name and Address:  Shaune Pollack, MD  Relative Name and Phone Number:       Current Level of Care: Hospital Recommended Level of Care: Assisted Living Facility Prior Approval Number:    Date Approved/Denied:   PASRR Number:    Discharge Plan:  (ALF)    Current Diagnoses: Patient Active Problem List   Diagnosis Date Noted  . Chronic systolic CHF (congestive heart failure) (HCC) 01/04/2015  . GERD (gastroesophageal reflux disease) 01/04/2015  . HTN (hypertension) 01/04/2015  . Anxiety 01/04/2015  . Chronic a-fib (HCC) 01/04/2015  . Dementia 01/04/2015  . COPD (chronic obstructive pulmonary disease) (HCC) 01/04/2015  . BPH (benign prostatic hyperplasia) 01/04/2015  . HCAP (healthcare-associated pneumonia) 07/04/2014  . COPD exacerbation (HCC) 07/04/2014  . Aneurysm of aorta (HCC) 07/04/2014    Orientation ACTIVITIES/SOCIAL BLADDER RESPIRATION    Self, Place  Active Continent Normal (2 liters O2)  BEHAVIORAL SYMPTOMS/MOOD NEUROLOGICAL BOWEL NUTRITION STATUS   (none)  (none) Continent Diet  PHYSICIAN VISITS COMMUNICATION OF NEEDS Height & Weight Skin  30 days Verbally   149 lbs. Normal          AMBULATORY STATUS RESPIRATION    Supervision limited Normal (2 liters O2)      Personal Care Assistance Level of Assistance  Bathing, Dressing Bathing Assistance: Limited assistance   Dressing Assistance: Limited assistance      Functional Limitations Info                SPECIAL CARE FACTORS FREQUENCY                      Additional Factors Info  Code Status, Allergies Code Status Info:  DNR Allergies Info: NKA           Current Medications (01/09/2015): Current Facility-Administered Medications  Medication Dose Route Frequency Provider Last Rate Last Dose  . acetaminophen (TYLENOL) tablet 650 mg  650 mg Oral Q6H PRN Oralia Manis, MD   650 mg at 01/08/15 1402   Or  . acetaminophen (TYLENOL) suppository 650 mg  650 mg Rectal Q6H PRN Oralia Manis, MD      . ALPRAZolam Prudy Feeler) tablet 0.5 mg  0.5 mg Oral Q8H PRN Ramonita Lab, MD   0.5 mg at 01/08/15 1611  . amoxicillin-clavulanate (AUGMENTIN) 875-125 MG per tablet 1 tablet  1 tablet Oral Q12H Altamese Dilling, MD   1 tablet at 01/09/15 1010  . aspirin EC tablet 81 mg  81 mg Oral QPM Oralia Manis, MD   81 mg at 01/08/15 1611  . brimonidine (ALPHAGAN) 0.2 % ophthalmic solution 1 drop  1 drop Both Eyes BID Ramonita Lab, MD   1 drop at 01/09/15 1000   And  . timolol (TIMOPTIC) 0.5 % ophthalmic solution 1 drop  1 drop Both Eyes BID Ramonita Lab, MD   1 drop at 01/09/15 1000  . digoxin (LANOXIN) tablet 0.125 mg  0.125 mg Oral Daily Oralia Manis, MD   0.125 mg at 01/09/15 1010  . dorzolamide (TRUSOPT) 2 % ophthalmic solution 1 drop  1 drop Both Eyes BID Oralia Manis, MD   1 drop  at 01/09/15 1000  . enoxaparin (LOVENOX) injection 40 mg  40 mg Subcutaneous Q24H Oralia Manis, MD   40 mg at 01/08/15 2131  . escitalopram (LEXAPRO) tablet 15 mg  15 mg Oral Daily Oralia Manis, MD   15 mg at 01/09/15 1011  . finasteride (PROSCAR) tablet 5 mg  5 mg Oral Daily Oralia Manis, MD   5 mg at 01/09/15 1000  . furosemide (LASIX) tablet 40 mg  40 mg Oral Daily Oralia Manis, MD   40 mg at 01/09/15 1011  . ipratropium-albuterol (DUONEB) 0.5-2.5 (3) MG/3ML nebulizer solution 3 mL  3 mL Nebulization Q4H PRN Oralia Manis, MD   3 mL at 01/09/15 0807  . levothyroxine (SYNTHROID, LEVOTHROID) tablet 25 mcg  25 mcg Oral QAC breakfast Oralia Manis, MD   25 mcg at 01/09/15 1010  . metoprolol tartrate (LOPRESSOR) tablet 25 mg  25 mg Oral BID Altamese Dilling, MD   25 mg at 01/09/15 1012  . nitroGLYCERIN (NITROSTAT) SL tablet 0.4 mg  0.4 mg Sublingual Q5 min PRN Delfino Lovett, MD   0.4 mg at 01/06/15 0547  . ondansetron (ZOFRAN) tablet 4 mg  4 mg Oral Q6H PRN Oralia Manis, MD       Or  . ondansetron [facility]) injection 4 mg  4 mg Intravenous Q6H PRN Oralia Manis, MD   4 mg at 01/09/15 0144  . pantoprazole (PROTONIX) EC tablet 40 mg  40 mg Oral QAC breakfast Oralia Manis, MD   40 mg at 01/09/15 1010  . senna-docusate (Senokot-S) tablet 1 tablet  1 tablet Oral QHS PRN Oralia Manis, MD      . sodium chloride 0.9 % injection 3 mL  3 mL Intravenous Q12H Oralia Manis, MD   3 mL at 01/09/15 1000  . zolpidem (AMBIEN) tablet 5 mg  5 mg Oral QHS PRN Ramonita Lab, MD   5 mg at 01/08/15 2131   Do not use this list as official medication orders. Please verify with discharge summary.  Discharge Medications:   Medication List    TAKE these medications        ALPRAZolam 0.25 MG tablet  Commonly known as:  XANAX  Take 0.25 mg by mouth every 8 (eight) hours as needed for anxiety.     amoxicillin-clavulanate 875-125 MG tablet  Commonly known as:  AUGMENTIN  Take 1 tablet by mouth every 12 (twelve) hours.     aspirin EC 81 MG tablet  Take 81 mg by mouth every evening.     bimatoprost 0.01 % Soln  Commonly known as:  LUMIGAN  Place 1 drop into both eyes at bedtime.     cholecalciferol 400 UNITS Tabs tablet  Commonly known as:  VITAMIN D  Take 400 Units by mouth daily.     COMBIGAN 0.2-0.5 % ophthalmic solution  Generic drug:  brimonidine-timolol  Place 1 drop into both eyes 2 (two) times daily.     digoxin 0.125 MG tablet  Commonly known as:  LANOXIN  Take 0.125 mg by mouth daily.     docusate sodium 100 MG capsule  Commonly known as:  COLACE  Take 100 mg by mouth 2 (two) times daily.     dorzolamide 2 % ophthalmic solution  Commonly known as:  TRUSOPT  Place 1 drop into both eyes 2 (two) times daily.     escitalopram 10 MG tablet   Commonly known as:  LEXAPRO  Take 15 mg by mouth daily.     eucerin cream  Apply 1 application topically 2 (two) times daily. Pt applies to arms and legs.     finasteride 5 MG tablet  Commonly known as:  PROSCAR  Take 5 mg by mouth daily.     furosemide 40 MG tablet  Commonly known as:  LASIX  Take 40 mg by mouth daily.     hydrocortisone cream 1 %  Apply 1 application topically as needed for itching (and rash).     ipratropium-albuterol 0.5-2.5 (3) MG/3ML Soln  Commonly known as:  DUONEB  Take 3 mLs by nebulization 3 (three) times daily as needed (for shortness of breath/wheezing).     levothyroxine 25 MCG tablet  Commonly known as:  SYNTHROID, LEVOTHROID  Take 25 mcg by mouth daily.     lisinopril 2.5 MG tablet  Commonly known as:  PRINIVIL,ZESTRIL  Take 2.5 mg by mouth daily.     Melatonin 3 MG Tabs  Take 3 mg by mouth at bedtime.     meloxicam 7.5 MG tablet  Commonly known as:  MOBIC  Take 7.5 mg by mouth daily.     metoCLOPramide 10 MG tablet  Commonly known as:  REGLAN  Take 10 mg by mouth 3 (three) times daily before meals.     metoprolol tartrate 25 MG tablet  Commonly known as:  LOPRESSOR  Take 12.5 mg by mouth daily.     mirtazapine 30 MG tablet  Commonly known as:  REMERON  Take 30 mg by mouth at bedtime.     ondansetron 4 MG tablet  Commonly known as:  ZOFRAN  Take 4 mg by mouth 2 (two) times daily as needed for nausea or vomiting.     pantoprazole 40 MG tablet  Commonly known as:  PROTONIX  Take 40 mg by mouth daily.     polyethylene glycol packet  Commonly known as:  MIRALAX / GLYCOLAX  Take 17 g by mouth daily as needed for moderate constipation.     PREPLUS 27-1 MG Tabs  Take 1 tablet by mouth daily.     QUEtiapine 50 MG tablet  Commonly known as:  SEROQUEL  Take 50 mg by mouth at bedtime.     QUEtiapine 25 MG tablet  Commonly known as:  SEROQUEL  Take 25 mg by mouth 3 (three) times daily.     simethicone 80 MG chewable tablet   Commonly known as:  MYLICON  Chew 80 mg by mouth every 4 (four) hours as needed for flatulence.        Relevant Imaging Results:  Relevant Lab Results:  Recent Labs    Additional Information    York SpanielMonica Marra, LCSW

## 2015-01-09 NOTE — Progress Notes (Signed)
Pt being discharged back to [facility]. Pt to be transported by non-emergency EMS. Adelina MingsKim Finbar Nippert RN

## 2015-01-09 NOTE — Clinical Social Work Note (Signed)
Patient is to discharge today to return to [facility]. CSW has spoken with patient's son and he is aware.  York SpanielMonica Danea Manter MSW,LCSW [phone]

## 2015-01-09 NOTE — Discharge Summary (Signed)
[facility] [place] [facility]   PATIENT NAME: Darrell StandardClyde Mcconnell    MR#:  [id number]  DATE OF BIRTH:  [birth year]  DATE OF ADMISSION:  01/04/2015 ADMITTING PHYSICIAN: Oralia Manisavid Willis, MD  DATE OF DISCHARGE: 01/09/2015  PRIMARY CARE PHYSICIAN: Lyndon CodeKHAN, FOZIA M, MD    ADMISSION DIAGNOSIS:  Community acquired pneumonia [J18.9]   DISCHARGE DIAGNOSIS:  HCAP (healthcare-associated pneumonia)  SECONDARY DIAGNOSIS:   Past Medical History  Diagnosis Date  . Chronic systolic CHF (congestive heart failure) (HCC)   . COPD (chronic obstructive pulmonary disease) (HCC)   . Aorta aneurysm (HCC)   . Asthma   . Hypertension   . Anxiety   . A-fib (HCC)   . Depression   . Hypothyroidism   . GERD (gastroesophageal reflux disease)   . Dementia   . BPH (benign prostatic hyperplasia)     HOSPITAL COURSE:   HCAP (healthcare-associated pneumonia)  pneumonia right lower lobe, He was treated with vancomycin and Zosyn and changed to Augmentin by mouth. He will continue Augmentin by mouth for 7 days. blood cultures negative so far.  #Acute depression with suicidal ideation and anxiety Patient was  made IVC but cleared by Dr. Toni Amendlapacs.  Continue Lexapro   #Chronic systolic CHF (congestive heart failure) (HCC) - not fluid overloaded at this time  continue home meds   # HTN (hypertension) - normotensive here Increased dose of metoprolol due to tachycardia and Hypertension. Now better controlled.  # Chronic a-fib (HCC) - rate controlled at this time continue rate controlling medications including digoxin On metoprolol.   #Dementia - continue Seroquel.  DISCHARGE CONDITIONS:   Stable, discharge to assisted living today.  CONSULTS OBTAINED:  Treatment Team:  Beau FannySurya K Challa, MD Audery AmelJohn T Clapacs, MD  DRUG ALLERGIES:  No Known Allergies  DISCHARGE MEDICATIONS:   Current Discharge Medication List    START taking these medications   Details   amoxicillin-clavulanate (AUGMENTIN) 875-125 MG tablet Take 1 tablet by mouth every 12 (twelve) hours. Qty: 14 tablet, Refills: 0      CONTINUE these medications which have NOT CHANGED   Details  ALPRAZolam (XANAX) 0.25 MG tablet Take 0.25 mg by mouth every 8 (eight) hours as needed for anxiety.     aspirin EC 81 MG tablet Take 81 mg by mouth every evening.     bimatoprost (LUMIGAN) 0.01 % SOLN Place 1 drop into both eyes at bedtime.     brimonidine-timolol (COMBIGAN) 0.2-0.5 % ophthalmic solution Place 1 drop into both eyes 2 (two) times daily.    cholecalciferol (VITAMIN D) 400 UNITS TABS tablet Take 400 Units by mouth daily.    digoxin (LANOXIN) 0.125 MG tablet Take 0.125 mg by mouth daily.    docusate sodium (COLACE) 100 MG capsule Take 100 mg by mouth 2 (two) times daily.    dorzolamide (TRUSOPT) 2 % ophthalmic solution Place 1 drop into both eyes 2 (two) times daily.    escitalopram (LEXAPRO) 10 MG tablet Take 15 mg by mouth daily.    finasteride (PROSCAR) 5 MG tablet Take 5 mg by mouth daily.    furosemide (LASIX) 40 MG tablet Take 40 mg by mouth daily.    hydrocortisone cream 1 % Apply 1 application topically as needed for itching (and rash).     ipratropium-albuterol (DUONEB) 0.5-2.5 (3) MG/3ML SOLN Take 3 mLs by nebulization 3 (three) times daily as needed (for shortness of breath/wheezing).     levothyroxine (SYNTHROID, LEVOTHROID) 25 MCG tablet Take 25 mcg  by mouth daily.     lisinopril (PRINIVIL,ZESTRIL) 2.5 MG tablet Take 2.5 mg by mouth daily.    Melatonin 3 MG TABS Take 3 mg by mouth at bedtime.     meloxicam (MOBIC) 7.5 MG tablet Take 7.5 mg by mouth daily.    metoCLOPramide (REGLAN) 10 MG tablet Take 10 mg by mouth 3 (three) times daily before meals.    metoprolol tartrate (LOPRESSOR) 25 MG tablet Take 12.5 mg by mouth daily.     mirtazapine (REMERON) 30 MG tablet Take 30 mg by mouth at bedtime.    ondansetron (ZOFRAN) 4 MG tablet Take 4 mg by mouth 2  (two) times daily as needed for nausea or vomiting.    pantoprazole (PROTONIX) 40 MG tablet Take 40 mg by mouth daily.    polyethylene glycol (MIRALAX / GLYCOLAX) packet Take 17 g by mouth daily as needed for moderate constipation.    Prenatal Vit-Fe Fumarate-FA (PREPLUS) 27-1 MG TABS Take 1 tablet by mouth daily.    !! QUEtiapine (SEROQUEL) 25 MG tablet Take 25 mg by mouth 3 (three) times daily.    !! QUEtiapine (SEROQUEL) 50 MG tablet Take 50 mg by mouth at bedtime.    simethicone (MYLICON) 80 MG chewable tablet Chew 80 mg by mouth every 4 (four) hours as needed for flatulence.    Skin Protectants, Misc. (EUCERIN) cream Apply 1 application topically 2 (two) times daily. Pt applies to arms and legs.     !! - Potential duplicate medications found. Please discuss with provider.       DISCHARGE INSTRUCTIONS:    If you experience worsening of your admission symptoms, develop shortness of breath, life threatening emergency, suicidal or homicidal thoughts you must seek medical attention immediately by calling 911 or calling your MD immediately  if symptoms less severe.  You Must read complete instructions/literature along with all the possible adverse reactions/side effects for all the Medicines you take and that have been prescribed to you. Take any new Medicines after you have completely understood and accept all the possible adverse reactions/side effects.   Please note  You were cared for by a hospitalist during your hospital stay. If you have any questions about your discharge medications or the care you received while you were in the hospital after you are discharged, you can call the unit and asked to speak with the hospitalist on call if the hospitalist that took care of you is not available. Once you are discharged, your primary care physician will handle any further medical issues. Please note that NO REFILLS for any discharge medications will be authorized once you are discharged,  as it is imperative that you return to your primary care physician (or establish a relationship with a primary care physician if you do not have one) for your aftercare needs so that they can reassess your need for medications and monitor your lab values.    Today   SUBJECTIVE   No complpaint.   VITAL SIGNS:  Blood pressure 117/91, pulse 61, temperature 97.7 F (36.5 C), temperature source Oral, resp. rate 20, height  (1.753 m), weight 65.363 kg (144 lb 1.6 oz), SpO2 96 %.  I/O:   Intake/Output Summary (Last 24 hours) at 01/09/15 1134 Last data filed at 01/09/15 0800  Gross per 24 hour  Intake    720 ml  Output    900 ml  Net   -180 ml    PHYSICAL EXAMINATION:  GENERAL:  79 y.o.-year-old patient lying in  the bed with no acute distress.  EYES: Pupils equal, round, reactive to light and accommodation. No scleral icterus. Extraocular muscles intact.  HEENT: Head atraumatic, normocephalic. Oropharynx and nasopharynx clear.  NECK:  Supple, no jugular venous distention. No thyroid enlargement, no tenderness.  LUNGS: Diminished  breath sounds bilaterally, no wheezing, rales,rhonchi or crepitation. No use of accessory muscles of respiration.  CARDIOVASCULAR: S1, S2 normal. No murmurs, rubs, or gallops.  ABDOMEN: Soft, non-tender, non-distended. Bowel sounds present. No organomegaly or mass.  EXTREMITIES: No pedal edema, cyanosis, or clubbing.  NEUROLOGIC: Cranial nerves II through XII are intact. Muscle strength 5/5 in all extremities. Sensation intact. Gait not checked.  PSYCHIATRIC: The patient is alert and oriented x 3.  SKIN: No obvious rash, lesion, or ulcer.   DATA REVIEW:   CBC  Recent Labs Lab 01/07/15 0613  WBC 8.2  HGB 13.2  HCT 39.4*  PLT 188    Chemistries   Recent Labs Lab 01/04/15 1907  01/07/15 0613  NA 141  < > 139  K 3.6  < > 4.0  CL 104  < > 99*  CO2 32  < > 35*  GLUCOSE 116*  < > 121*  BUN 17  < > 11  CREATININE 0.97  < > 0.94  CALCIUM  8.8*  < > 8.8*  AST 20  --   --   ALT 13*  --   --   ALKPHOS 79  --   --   BILITOT 0.1*  --   --   < > = values in this interval not displayed.  Cardiac Enzymes  Recent Labs Lab 01/04/15 1907  TROPONINI <0.03    Microbiology Results  Results for orders placed or performed during the hospital encounter of 01/04/15  Blood culture (routine x 2)     Status: None   Collection Time: 01/04/15  8:15 PM  Result Value Ref Range Status   Specimen Description BLOOD LEFT ARM  Final   Special Requests BOTTLES DRAWN AEROBIC AND ANAEROBIC 4CC  Final   Culture NO GROWTH 5 DAYS  Final   Report Status 01/09/2015 FINAL  Final  Blood culture (routine x 2)     Status: None   Collection Time: 01/04/15  8:20 PM  Result Value Ref Range Status   Specimen Description BLOOD RIGHT ARM  Final   Special Requests BOTTLES DRAWN AEROBIC AND ANAEROBIC 4CC  Final   Culture NO GROWTH 5 DAYS  Final   Report Status 01/09/2015 FINAL  Final  MRSA PCR Screening     Status: None   Collection Time: 01/05/15  1:23 AM  Result Value Ref Range Status   MRSA by PCR NEGATIVE NEGATIVE Final    Comment:        The GeneXpert MRSA Assay (FDA approved for NASAL specimens only), is one component of a comprehensive MRSA colonization surveillance program. It is not intended to diagnose MRSA infection nor to guide or monitor treatment for MRSA infections.   Culture, sputum-assessment     Status: None   Collection Time: 01/08/15  6:55 AM  Result Value Ref Range Status   Specimen Description SPU  Final   Special Requests NONE  Final   Sputum evaluation   Final    Sputum specimen not acceptable for testing.  Please recollect.   Results Called to: [facility] AT 1030 01/08/15 DV    Report Status 01/08/2015 FINAL  Final    RADIOLOGY:  Ct Chest W Contrast  01/08/2015  CLINICAL DATA:  79 year old male with confluent abnormal opacity at the right lung base new since May. COPD. Increasing shortness of breath. Initial  encounter. EXAM: CT CHEST WITH CONTRAST TECHNIQUE: Multidetector CT imaging of the chest was performed during intravenous contrast administration. CONTRAST:  75mL OMNIPAQUE IOHEXOL 300 MG/ML  SOLN COMPARISON:  Chest radiographs 01/07/2015 and earlier. Chest CTA 12/20/2013. CT Abdomen and Pelvis 07/04/2014 FINDINGS: Moderate size layering right pleural effusion is new since the May CT Abdomen and Pelvis. No pericardial effusion. Small mediastinal lymph nodes are stable and within normal limits. No hilar lymphadenopathy. Major airways are patent. Centrilobular emphysema. There is right lower lobe medial and posterior basal segment consolidation in addition to compressive right lung atelectasis. No obstructing mass is evident. Left lower lobe atelectasis has regressed since the prior CTA. Residual atelectasis along the chronically dolichoectatic thoracic aorta. Sequelae of median sternotomy. Chronic aortic and coronary artery calcified plaque. Proximal great vessels are patent. Chronic fusiform enlargement of the thoracic aorta, stable since 2015 measuring about 40 mm diameter throughout the arch and descending segment. The ascending aorta measures about 42 mm diameter and likewise appears stable. No thoracic aortic dissection. 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 This is minimally increased from the prior CTs (49-50 mm at that time). No axillary lymphadenopathy. Stable visualized upper abdominal viscera. No acute osseous abnormality identified. IMPRESSION: 1. Moderate layering right pleural effusion new since May. Mostly atelectasis is associated but there is consolidation of the medial and posterior basal segments of the right lower lobe. No obstructing mass is evident. No mediastinal or hilar lymphadenopathy. But consider bronchoscopic evaluation if this persists on follow-up exams. 2. Emphysema. 3. Advanced  atherosclerosis and generalized aortic dolichoectasia. Maximal caliber of the visible aorta at 51 mm (juxta diaphragmatic) has mildly increased since October 2015 (49 mm at that time). Of note, the large infrarenal aortic aneurysm seen in May is not visible today. Electronically Signed   By: Odessa Fleming M.D.   On: 01/08/2015 10:13        Management plans discussed with the patient, family and they are in agreement.  CODE STATUS:     Code Status Orders        Start     Ordered   01/04/15 2216  Do not attempt resuscitation (DNR)   Continuous    Question Answer Comment  In the event of cardiac or respiratory ARREST Do not call a "code blue"   In the event of cardiac or respiratory ARREST Do not perform Intubation, CPR, defibrillation or ACLS   In the event of cardiac or respiratory ARREST Use medication by any route, position, wound care, and other measures to relive pain and suffering. May use oxygen, suction and manual treatment of airway obstruction as needed for comfort.      01/04/15 2215    Advance Directive Documentation        Most Recent Value   Type of Advance Directive  Living will   Pre-existing out of facility DNR order (yellow form or pink MOST form)     "MOST" Form in Place?        TOTAL TIME TAKING CARE OF THIS PATIENT: 36 minutes.    Shaune Pollack M.D on 01/09/2015 at 11:34 AM  Between 7am to 6pm - Pager - [phone]  After 6pm go to [email or web address] - password [email or web address]  [facility]  Office  [phone]  CC: Primary care  physician; Lyndon CodeKHAN, FOZIA M, MD

## 2015-01-09 NOTE — Clinical Social Work Note (Signed)
Lynnette, the owner of [facility] has stated patient will need to transport back to them via EMS. York SpanielMonica Cherrelle Plante MSW,LCSW [phone]

## 2015-01-09 NOTE — Discharge Instructions (Signed)
Heart healthy diet. Activity as tolerated. Fall precaution, aspiration precaution and continue home oxygen 2 L by nasal cannula.

## 2015-01-23 ENCOUNTER — Encounter: Payer: Self-pay | Admitting: Emergency Medicine

## 2015-01-23 ENCOUNTER — Emergency Department
Admission: EM | Admit: 2015-01-23 | Discharge: 2015-01-23 | Disposition: A | Discharge status: Home / Self Care | Payer: Medicare Other | Attending: Emergency Medicine | Admitting: Emergency Medicine

## 2015-01-23 ENCOUNTER — Emergency Department: Payer: Medicare Other

## 2015-01-23 DIAGNOSIS — Z9981 Dependence on supplemental oxygen: Secondary | ICD-10-CM | POA: Diagnosis not present

## 2015-01-23 DIAGNOSIS — R059 Cough, unspecified: Secondary | ICD-10-CM

## 2015-01-23 DIAGNOSIS — Z791 Long term (current) use of non-steroidal anti-inflammatories (NSAID): Secondary | ICD-10-CM | POA: Diagnosis not present

## 2015-01-23 DIAGNOSIS — R0602 Shortness of breath: Secondary | ICD-10-CM | POA: Diagnosis not present

## 2015-01-23 DIAGNOSIS — R05 Cough: Secondary | ICD-10-CM | POA: Diagnosis not present

## 2015-01-23 DIAGNOSIS — Z87891 Personal history of nicotine dependence: Secondary | ICD-10-CM | POA: Diagnosis not present

## 2015-01-23 DIAGNOSIS — J441 Chronic obstructive pulmonary disease with (acute) exacerbation: Secondary | ICD-10-CM | POA: Diagnosis not present

## 2015-01-23 DIAGNOSIS — Z792 Long term (current) use of antibiotics: Secondary | ICD-10-CM | POA: Insufficient documentation

## 2015-01-23 DIAGNOSIS — Z79899 Other long term (current) drug therapy: Secondary | ICD-10-CM | POA: Diagnosis not present

## 2015-01-23 DIAGNOSIS — I10 Essential (primary) hypertension: Secondary | ICD-10-CM | POA: Diagnosis not present

## 2015-01-23 DIAGNOSIS — Z7982 Long term (current) use of aspirin: Secondary | ICD-10-CM | POA: Insufficient documentation

## 2015-01-23 LAB — BASIC METABOLIC PANEL
ANION GAP: 8 (ref 5–15)
BUN: 20 mg/dL (ref 6–20)
CHLORIDE: 105 mmol/L (ref 101–111)
CO2: 31 mmol/L (ref 22–32)
Calcium: 8.8 mg/dL — ABNORMAL LOW (ref 8.9–10.3)
Creatinine, Ser: 1.31 mg/dL — ABNORMAL HIGH (ref 0.61–1.24)
GFR, EST AFRICAN AMERICAN: 59 mL/min — AB (ref >60)
GFR, EST NON AFRICAN AMERICAN: 51 mL/min — AB (ref >60)
Glucose, Bld: 118 mg/dL — ABNORMAL HIGH (ref 65–99)
POTASSIUM: 3.8 mmol/L (ref 3.5–5.1)
SODIUM: 144 mmol/L (ref 135–145)

## 2015-01-23 LAB — CBC WITH DIFFERENTIAL/PLATELET
BASOS PCT: 1 %
Basophils Absolute: 0 10*3/uL (ref 0–0.1)
EOS ABS: 0.5 10*3/uL (ref 0–0.7)
Eosinophils Relative: 8 %
HCT: 34.4 % — ABNORMAL LOW (ref 40.0–52.0)
Hemoglobin: 11.6 g/dL — ABNORMAL LOW (ref 13.0–18.0)
LYMPHS ABS: 1.2 10*3/uL (ref 1.0–3.6)
Lymphocytes Relative: 18 %
MCH: 27.9 pg (ref 26.0–34.0)
MCHC: 33.8 g/dL (ref 32.0–36.0)
MCV: 82.7 fL (ref 80.0–100.0)
MONO ABS: 0.5 10*3/uL (ref 0.2–1.0)
MONOS PCT: 8 %
NEUTROS PCT: 65 %
Neutro Abs: 4.4 10*3/uL (ref 1.4–6.5)
PLATELETS: 170 10*3/uL (ref 150–440)
RBC: 4.16 MIL/uL — ABNORMAL LOW (ref 4.40–5.90)
RDW: 16.5 % — AB (ref 11.5–14.5)
WBC: 6.6 10*3/uL (ref 3.8–10.6)

## 2015-01-23 MED ORDER — ACETAMINOPHEN 325 MG PO TABS
650.0000 mg | ORAL_TABLET | Freq: Once | ORAL | Status: AC
  Administered 2015-01-23: 650 mg via ORAL
  Filled 2015-01-23: qty 2

## 2015-01-23 MED ORDER — SODIUM CHLORIDE 0.9 % IV BOLUS (SEPSIS)
500.0000 mL | Freq: Once | INTRAVENOUS | Status: AC
  Administered 2015-01-23: 500 mL via INTRAVENOUS

## 2015-01-23 MED ORDER — IPRATROPIUM-ALBUTEROL 0.5-2.5 (3) MG/3ML IN SOLN
3.0000 mL | Freq: Once | RESPIRATORY_TRACT | Status: AC
  Administered 2015-01-23: 3 mL via RESPIRATORY_TRACT
  Filled 2015-01-23: qty 3

## 2015-01-23 NOTE — ED Notes (Signed)
Patient transported to X-ray 

## 2015-01-23 NOTE — Discharge Instructions (Signed)

## 2015-01-23 NOTE — ED Provider Notes (Signed)
[facility] Emergency Department Provider Note  ____________________________________________  Time seen: 4:55 PM on arrival by EMS  I have reviewed the triage vital signs and the nursing notes.   HISTORY  Chief Complaint Shortness of Breath and Cough    HPI Darrell Mcconnell is a 79 y.o. male who sent to the ED from his nursing facility due to cough. He is chronically on 2 L nasal cannula. The patient denies any shortness of breath or chest pain or other complaints at this time. States he feels fine.Per EMS, nursing home was concerned that he might have pneumonia.     Past Medical History  Diagnosis Date  . Chronic systolic CHF (congestive heart failure) (HCC)   . COPD (chronic obstructive pulmonary disease) (HCC)   . Aorta aneurysm (HCC)   . Asthma   . Hypertension   . Anxiety   . A-fib (HCC)   . Depression   . Hypothyroidism   . GERD (gastroesophageal reflux disease)   . Dementia   . BPH (benign prostatic hyperplasia)      Patient Active Problem List   Diagnosis Date Noted  . Chronic systolic CHF (congestive heart failure) (HCC) 01/04/2015  . GERD (gastroesophageal reflux disease) 01/04/2015  . HTN (hypertension) 01/04/2015  . Anxiety 01/04/2015  . Chronic a-fib (HCC) 01/04/2015  . Dementia 01/04/2015  . COPD (chronic obstructive pulmonary disease) (HCC) 01/04/2015  . BPH (benign prostatic hyperplasia) 01/04/2015  . HCAP (healthcare-associated pneumonia) 07/04/2014  . COPD exacerbation (HCC) 07/04/2014  . Aneurysm of aorta (HCC) 07/04/2014     Past Surgical History  Procedure Laterality Date  . Coronary artery bypass graft    . Prostate surgery    . Abdominal aortic aneurysm repair       Current Outpatient Rx  Name  Route  Sig  Dispense  Refill  . ALPRAZolam (XANAX) 0.25 MG tablet   Oral   Take 0.25 mg by mouth every 8 (eight) hours as needed for anxiety.          Marland Kitchen amoxicillin-clavulanate (AUGMENTIN) 875-125 MG tablet   Oral    Take 1 tablet by mouth every 12 (twelve) hours.   14 tablet   0   . aspirin EC 81 MG tablet   Oral   Take 81 mg by mouth every evening.          . bimatoprost (LUMIGAN) 0.01 % SOLN   Both Eyes   Place 1 drop into both eyes at bedtime.          . brimonidine-timolol (COMBIGAN) 0.2-0.5 % ophthalmic solution   Both Eyes   Place 1 drop into both eyes 2 (two) times daily.         . cholecalciferol (VITAMIN D) 400 UNITS TABS tablet   Oral   Take 400 Units by mouth daily.         . digoxin (LANOXIN) 0.125 MG tablet   Oral   Take 0.125 mg by mouth daily.         Marland Kitchen docusate sodium (COLACE) 100 MG capsule   Oral   Take 100 mg by mouth 2 (two) times daily.         . dorzolamide (TRUSOPT) 2 % ophthalmic solution   Both Eyes   Place 1 drop into both eyes 2 (two) times daily.         Marland Kitchen escitalopram (LEXAPRO) 10 MG tablet   Oral   Take 15 mg by mouth daily.         Marland Kitchen  finasteride (PROSCAR) 5 MG tablet   Oral   Take 5 mg by mouth daily.         . furosemide (LASIX) 40 MG tablet   Oral   Take 40 mg by mouth daily.         . hydrocortisone cream 1 %   Topical   Apply 1 application topically as needed for itching (and rash).          Marland Kitchen ipratropium-albuterol (DUONEB) 0.5-2.5 (3) MG/3ML SOLN   Nebulization   Take 3 mLs by nebulization 3 (three) times daily as needed (for shortness of breath/wheezing).          Marland Kitchen levothyroxine (SYNTHROID, LEVOTHROID) 25 MCG tablet   Oral   Take 25 mcg by mouth daily.          Marland Kitchen lisinopril (PRINIVIL,ZESTRIL) 2.5 MG tablet   Oral   Take 2.5 mg by mouth daily.         . Melatonin 3 MG TABS   Oral   Take 3 mg by mouth at bedtime.          . meloxicam (MOBIC) 7.5 MG tablet   Oral   Take 7.5 mg by mouth daily.         . metoCLOPramide (REGLAN) 10 MG tablet   Oral   Take 10 mg by mouth 3 (three) times daily before meals.         . metoprolol tartrate (LOPRESSOR) 25 MG tablet   Oral   Take 12.5 mg by mouth  daily.          . mirtazapine (REMERON) 30 MG tablet   Oral   Take 30 mg by mouth at bedtime.         . ondansetron (ZOFRAN) 4 MG tablet   Oral   Take 4 mg by mouth 2 (two) times daily as needed for nausea or vomiting.         . pantoprazole (PROTONIX) 40 MG tablet   Oral   Take 40 mg by mouth daily.         . polyethylene glycol (MIRALAX / GLYCOLAX) packet   Oral   Take 17 g by mouth daily as needed for moderate constipation.         . Prenatal Vit-Fe Fumarate-FA (PREPLUS) 27-1 MG TABS   Oral   Take 1 tablet by mouth daily.         . QUEtiapine (SEROQUEL) 25 MG tablet   Oral   Take 25 mg by mouth 3 (three) times daily.         . QUEtiapine (SEROQUEL) 50 MG tablet   Oral   Take 50 mg by mouth at bedtime.         . simethicone (MYLICON) 80 MG chewable tablet   Oral   Chew 80 mg by mouth every 4 (four) hours as needed for flatulence.         . Skin Protectants, Misc. (EUCERIN) cream   Topical   Apply 1 application topically 2 (two) times daily. Pt applies to arms and legs.            Allergies Review of patient's allergies indicates no known allergies.   Family History  Problem Relation Age of Onset  . CAD Father     Social History Social History  Substance Use Topics  . Smoking status: Former Games developer  . Smokeless tobacco: None  . Alcohol Use: No    Review of Systems  Constitutional:  No fever or chills. No weight changes Eyes:   No blurry vision or double vision.  ENT:   No sore throat. Cardiovascular:   No chest pain. Respiratory:   No dyspnea positive cough. Gastrointestinal:   Negative for abdominal pain, vomiting and diarrhea.  No BRBPR or melena. Genitourinary:   Negative for dysuria, urinary retention, bloody urine, or difficulty urinating. Musculoskeletal:   Negative for back pain. No joint swelling or pain. Skin:   Negative for rash. Neurological:   Negative for headaches, focal weakness or numbness. Psychiatric:  No  anxiety or depression.   Endocrine:  No hot/cold intolerance, changes in energy, or sleep difficulty.  10-point ROS otherwise negative.  ____________________________________________   PHYSICAL EXAM:  VITAL SIGNS: ED Triage Vitals  Enc Vitals Group     BP 01/23/15 1658 154/101 mmHg     Pulse Rate 01/23/15 1658 70     Resp 01/23/15 1700 11     Temp 01/23/15 1658 98.8 F (37.1 C)     Temp Source 01/23/15 1658 Oral     SpO2 01/23/15 1658 97 %     Weight 01/23/15 1658 149 lb (67.586 kg)     Height 01/23/15 1658 6' (1.829 m)     Head Cir --      Peak Flow --      Pain Score 01/23/15 1700 0     Pain Loc --      Pain Edu? --      Excl. in GC? --      Constitutional:   Alert and oriented 2. Well appearing and in no distress. Eyes:   No scleral icterus. No conjunctival pallor. PERRL. EOMI ENT   Head:   Normocephalic and atraumatic.   Nose:   No congestion/rhinnorhea. No septal hematoma   Mouth/Throat:   MMM, no pharyngeal erythema. No peritonsillar mass. No uvula shift.   Neck:   No stridor. No SubQ emphysema. No meningismus. Hematological/Lymphatic/Immunilogical:   No cervical lymphadenopathy. Cardiovascular:   irreg irreg. rhythm. Normal and symmetric distal pulses are present in all extremities. No murmurs, rubs, or gallops. Respiratory:   Normal respiratory effort without tachypnea nor retractions. Breath sounds are clear and equal bilaterally. No wheezes/rales/rhonchi. Normal expiratory phase Gastrointestinal:   Soft and nontender. No distention. There is no CVA tenderness.  No rebound, rigidity, or guarding. Genitourinary:   deferred Musculoskeletal:   Nontender with normal range of motion in all extremities. No joint effusions.  No lower extremity tenderness.  No edema. Neurologic:   Normal speech and language.  CN 2-10 normal. Motor grossly intact. No pronator drift.  Normal gait. No gross focal neurologic deficits are appreciated.  Skin:    Skin is warm,  dry and intact. No rash noted.  No petechiae, purpura, or bullae. Psychiatric:   Mood and affect are normal. Speech and behavior are normal. Patient exhibits appropriate insight and judgment.  ____________________________________________    LABS (pertinent positives/negatives) (all labs ordered are listed, but only abnormal results are displayed) Labs Reviewed  BASIC METABOLIC PANEL - Abnormal; Notable for the following:    Glucose, Bld 118 (*)    Creatinine, Ser 1.31 (*)    Calcium 8.8 (*)    GFR calc non Af Amer 51 (*)    GFR calc Af Amer 59 (*)    All other components within normal limits  CBC WITH DIFFERENTIAL/PLATELET - Abnormal; Notable for the following:    RBC 4.16 (*)    Hemoglobin 11.6 (*)    HCT  34.4 (*)    RDW 16.5 (*)    All other components within normal limits   ____________________________________________   EKG  Interpreted by me Atrial fibrillation rate of 72, normal axis intervals QRS and ST segments and T waves  ____________________________________________    RADIOLOGY  Chest x-ray unremarkable  ____________________________________________   PROCEDURES   ____________________________________________   INITIAL IMPRESSION / ASSESSMENT AND PLAN / ED COURSE  Pertinent labs & imaging results that were available during my care of the patient were reviewed by me and considered in my medical decision making (see chart for details).  Patient sent to ED for cough. He is well-appearing no acute distress and denies any complaints. Only slight nonproductive cough noted in the ED. Vitals unremarkable except for mild hypertension. Oxygen saturation is stable on his chronic 2 L nasal cannula.  Workup negative, we'll discharge home and have him follow up with primary care. Continue all home medications.     ____________________________________________   FINAL CLINICAL IMPRESSION(S) / ED DIAGNOSES  Final diagnoses:  Cough      Sharman CheekPhillip Lea Baine,  MD 01/23/15 1929

## 2015-01-23 NOTE — ED Notes (Signed)
Pt attempting to leave again. Placed pt back in bed and on 2L nasal cannula.  Explained the delay and the processes that must take place prior to discharge.

## 2015-01-23 NOTE — ED Notes (Signed)
Report called to [facility]; representative states that a [name] is on the way to pick up pt.  Pt placed in lobby with d/c papers at this time.

## 2015-01-23 NOTE — ED Notes (Signed)
Pt trying to leave while still hooked up to IV, oxygen, monitor.  Helped patient get back in the bed and comfortable; explained delay to patient.  MD at bedside.

## 2015-02-03 DIAGNOSIS — J449 Chronic obstructive pulmonary disease, unspecified: Secondary | ICD-10-CM | POA: Diagnosis not present

## 2015-02-19 ENCOUNTER — Emergency Department: Payer: Medicare Other

## 2015-02-19 ENCOUNTER — Observation Stay
Admission: EM | Admit: 2015-02-19 | Discharge: 2015-02-20 | Disposition: A | Discharge status: Skilled Nursing Facility | Payer: Medicare Other | Attending: Internal Medicine | Admitting: Internal Medicine

## 2015-02-19 ENCOUNTER — Encounter: Payer: Self-pay | Admitting: Emergency Medicine

## 2015-02-19 DIAGNOSIS — Z87891 Personal history of nicotine dependence: Secondary | ICD-10-CM | POA: Diagnosis not present

## 2015-02-19 DIAGNOSIS — K219 Gastro-esophageal reflux disease without esophagitis: Secondary | ICD-10-CM | POA: Diagnosis present

## 2015-02-19 DIAGNOSIS — I482 Chronic atrial fibrillation, unspecified: Secondary | ICD-10-CM | POA: Diagnosis present

## 2015-02-19 DIAGNOSIS — Z66 Do not resuscitate: Secondary | ICD-10-CM | POA: Insufficient documentation

## 2015-02-19 DIAGNOSIS — E039 Hypothyroidism, unspecified: Secondary | ICD-10-CM | POA: Diagnosis not present

## 2015-02-19 DIAGNOSIS — R0602 Shortness of breath: Secondary | ICD-10-CM | POA: Insufficient documentation

## 2015-02-19 DIAGNOSIS — J189 Pneumonia, unspecified organism: Secondary | ICD-10-CM | POA: Diagnosis not present

## 2015-02-19 DIAGNOSIS — F329 Major depressive disorder, single episode, unspecified: Secondary | ICD-10-CM | POA: Diagnosis not present

## 2015-02-19 DIAGNOSIS — Z9889 Other specified postprocedural states: Secondary | ICD-10-CM | POA: Diagnosis not present

## 2015-02-19 DIAGNOSIS — F419 Anxiety disorder, unspecified: Secondary | ICD-10-CM | POA: Diagnosis present

## 2015-02-19 DIAGNOSIS — F0390 Unspecified dementia without behavioral disturbance: Secondary | ICD-10-CM | POA: Diagnosis present

## 2015-02-19 DIAGNOSIS — I4581 Long QT syndrome: Secondary | ICD-10-CM | POA: Insufficient documentation

## 2015-02-19 DIAGNOSIS — J441 Chronic obstructive pulmonary disease with (acute) exacerbation: Secondary | ICD-10-CM | POA: Diagnosis present

## 2015-02-19 DIAGNOSIS — I5022 Chronic systolic (congestive) heart failure: Secondary | ICD-10-CM | POA: Diagnosis not present

## 2015-02-19 DIAGNOSIS — Z7951 Long term (current) use of inhaled steroids: Secondary | ICD-10-CM | POA: Diagnosis not present

## 2015-02-19 DIAGNOSIS — J45909 Unspecified asthma, uncomplicated: Secondary | ICD-10-CM | POA: Insufficient documentation

## 2015-02-19 DIAGNOSIS — I10 Essential (primary) hypertension: Secondary | ICD-10-CM | POA: Diagnosis present

## 2015-02-19 DIAGNOSIS — Z8249 Family history of ischemic heart disease and other diseases of the circulatory system: Secondary | ICD-10-CM | POA: Diagnosis not present

## 2015-02-19 DIAGNOSIS — Z951 Presence of aortocoronary bypass graft: Secondary | ICD-10-CM | POA: Insufficient documentation

## 2015-02-19 DIAGNOSIS — Z7982 Long term (current) use of aspirin: Secondary | ICD-10-CM | POA: Diagnosis not present

## 2015-02-19 DIAGNOSIS — Z791 Long term (current) use of non-steroidal anti-inflammatories (NSAID): Secondary | ICD-10-CM | POA: Insufficient documentation

## 2015-02-19 DIAGNOSIS — Y95 Nosocomial condition: Secondary | ICD-10-CM | POA: Diagnosis not present

## 2015-02-19 DIAGNOSIS — Z79899 Other long term (current) drug therapy: Secondary | ICD-10-CM | POA: Diagnosis not present

## 2015-02-19 DIAGNOSIS — N400 Enlarged prostate without lower urinary tract symptoms: Secondary | ICD-10-CM | POA: Diagnosis present

## 2015-02-19 DIAGNOSIS — I110 Hypertensive heart disease with heart failure: Secondary | ICD-10-CM | POA: Insufficient documentation

## 2015-02-19 LAB — COMPREHENSIVE METABOLIC PANEL
ALBUMIN: 3.8 g/dL (ref 3.5–5.0)
ALK PHOS: 81 U/L (ref 38–126)
ALT: 13 U/L — ABNORMAL LOW (ref 17–63)
ANION GAP: 10 (ref 5–15)
AST: 21 U/L (ref 15–41)
BILIRUBIN TOTAL: 0.5 mg/dL (ref 0.3–1.2)
BUN: 18 mg/dL (ref 6–20)
CO2: 28 mmol/L (ref 22–32)
Calcium: 9.2 mg/dL (ref 8.9–10.3)
Chloride: 103 mmol/L (ref 101–111)
Creatinine, Ser: 0.97 mg/dL (ref 0.61–1.24)
GFR calc Af Amer: >60 mL/min (ref >60)
GFR calc non Af Amer: >60 mL/min (ref >60)
GLUCOSE: 101 mg/dL — AB (ref 65–99)
POTASSIUM: 3.5 mmol/L (ref 3.5–5.1)
SODIUM: 141 mmol/L (ref 135–145)
TOTAL PROTEIN: 7.7 g/dL (ref 6.5–8.1)

## 2015-02-19 LAB — TROPONIN I
Troponin I: 0.04 ng/mL — ABNORMAL HIGH (ref <0.031)
Troponin I: <0.03 ng/mL (ref <0.031)

## 2015-02-19 LAB — CBC
HEMATOCRIT: 35.9 % — AB (ref 40.0–52.0)
HEMOGLOBIN: 12.1 g/dL — AB (ref 13.0–18.0)
MCH: 28 pg (ref 26.0–34.0)
MCHC: 33.5 g/dL (ref 32.0–36.0)
MCV: 83.4 fL (ref 80.0–100.0)
Platelets: 140 10*3/uL — ABNORMAL LOW (ref 150–440)
RBC: 4.31 MIL/uL — ABNORMAL LOW (ref 4.40–5.90)
RDW: 16.2 % — AB (ref 11.5–14.5)
WBC: 9.1 10*3/uL (ref 3.8–10.6)

## 2015-02-19 IMAGING — CR DG CHEST 2V
1 series · 2 of 2 positions shown · non-contrast
Comparison: December 20, 2013.

CLINICAL DATA: Fever, cough hypoxia.

EXAM:
CHEST  2 VIEW

[Series 1: w chest pa · 0.14mm/px · 2 of 2 slices shown]
[im 1/2]
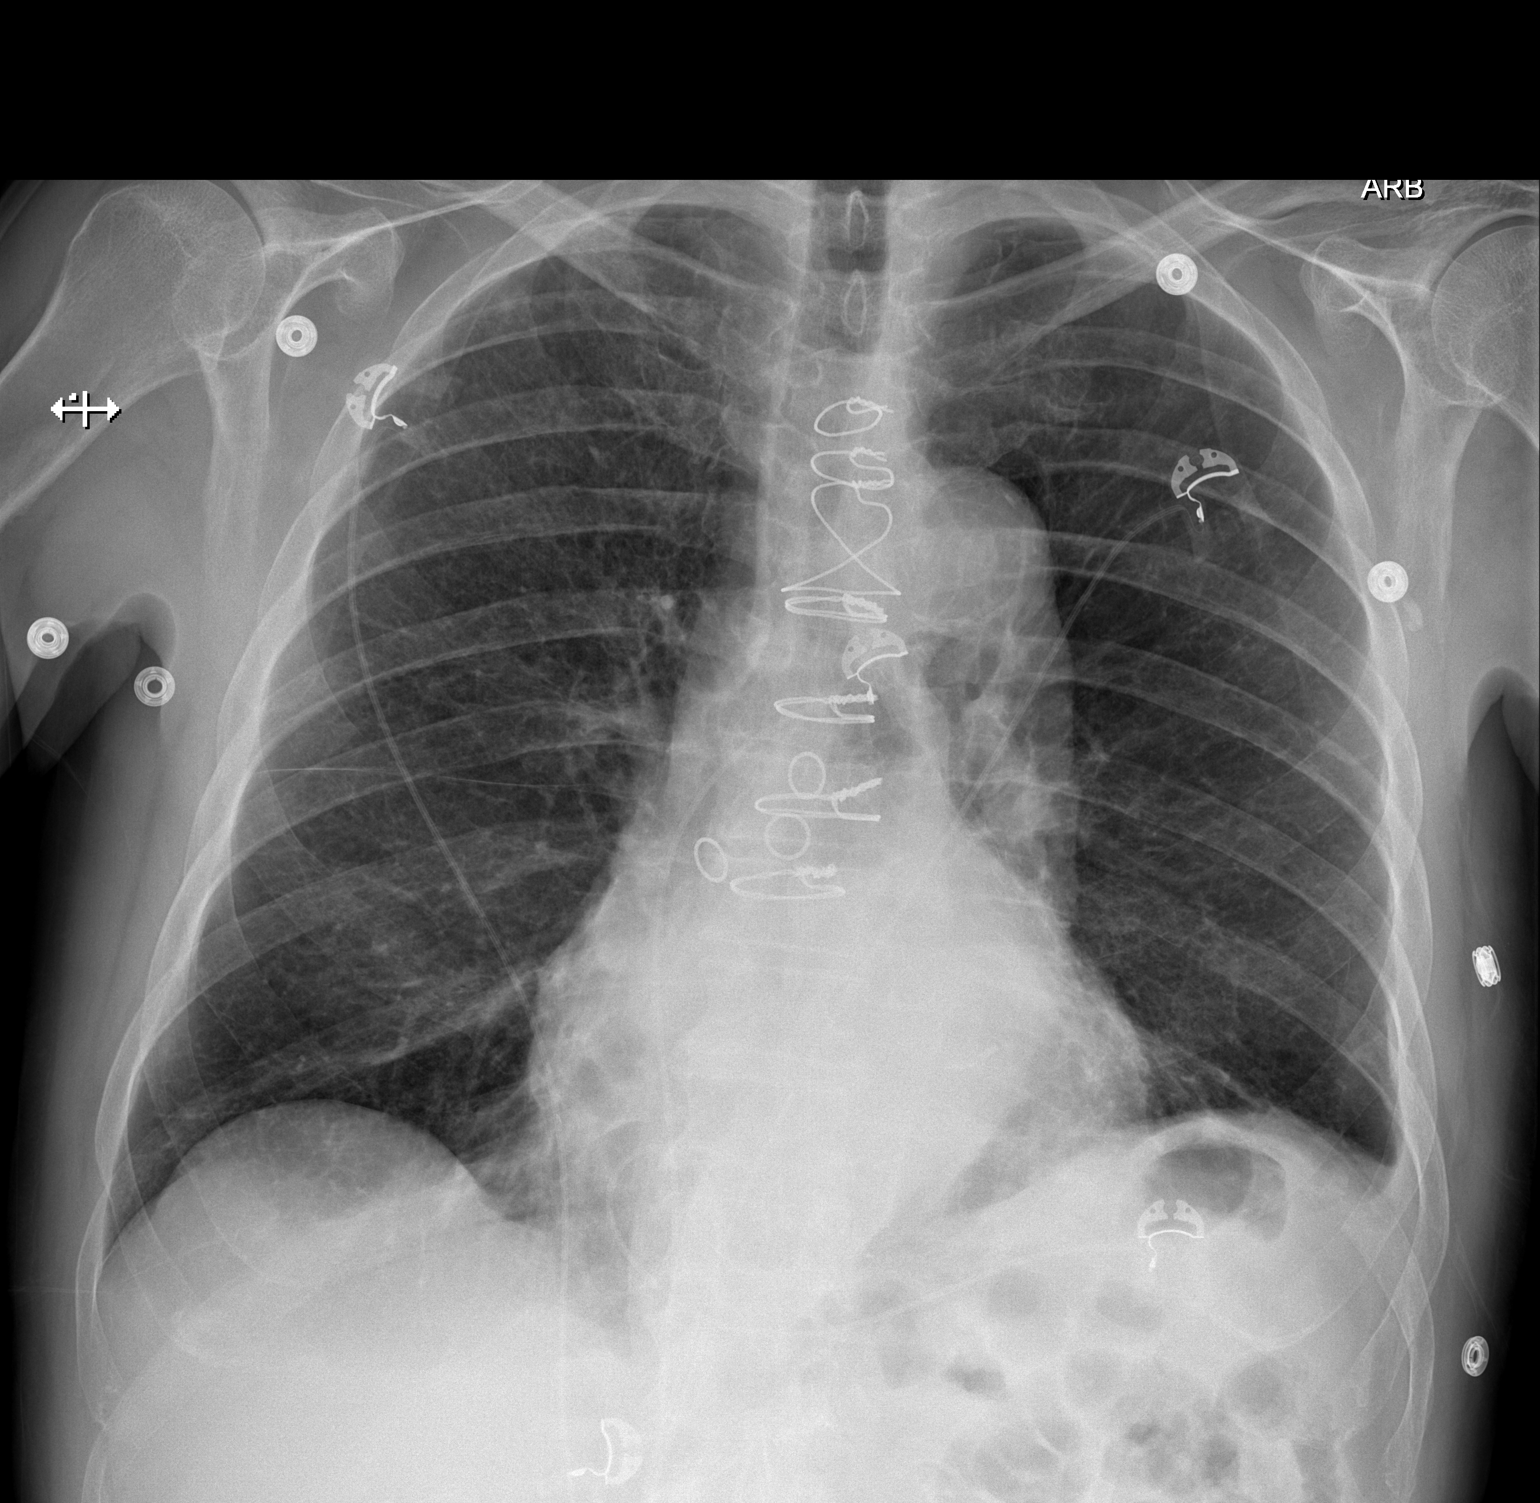
[im 2/2]
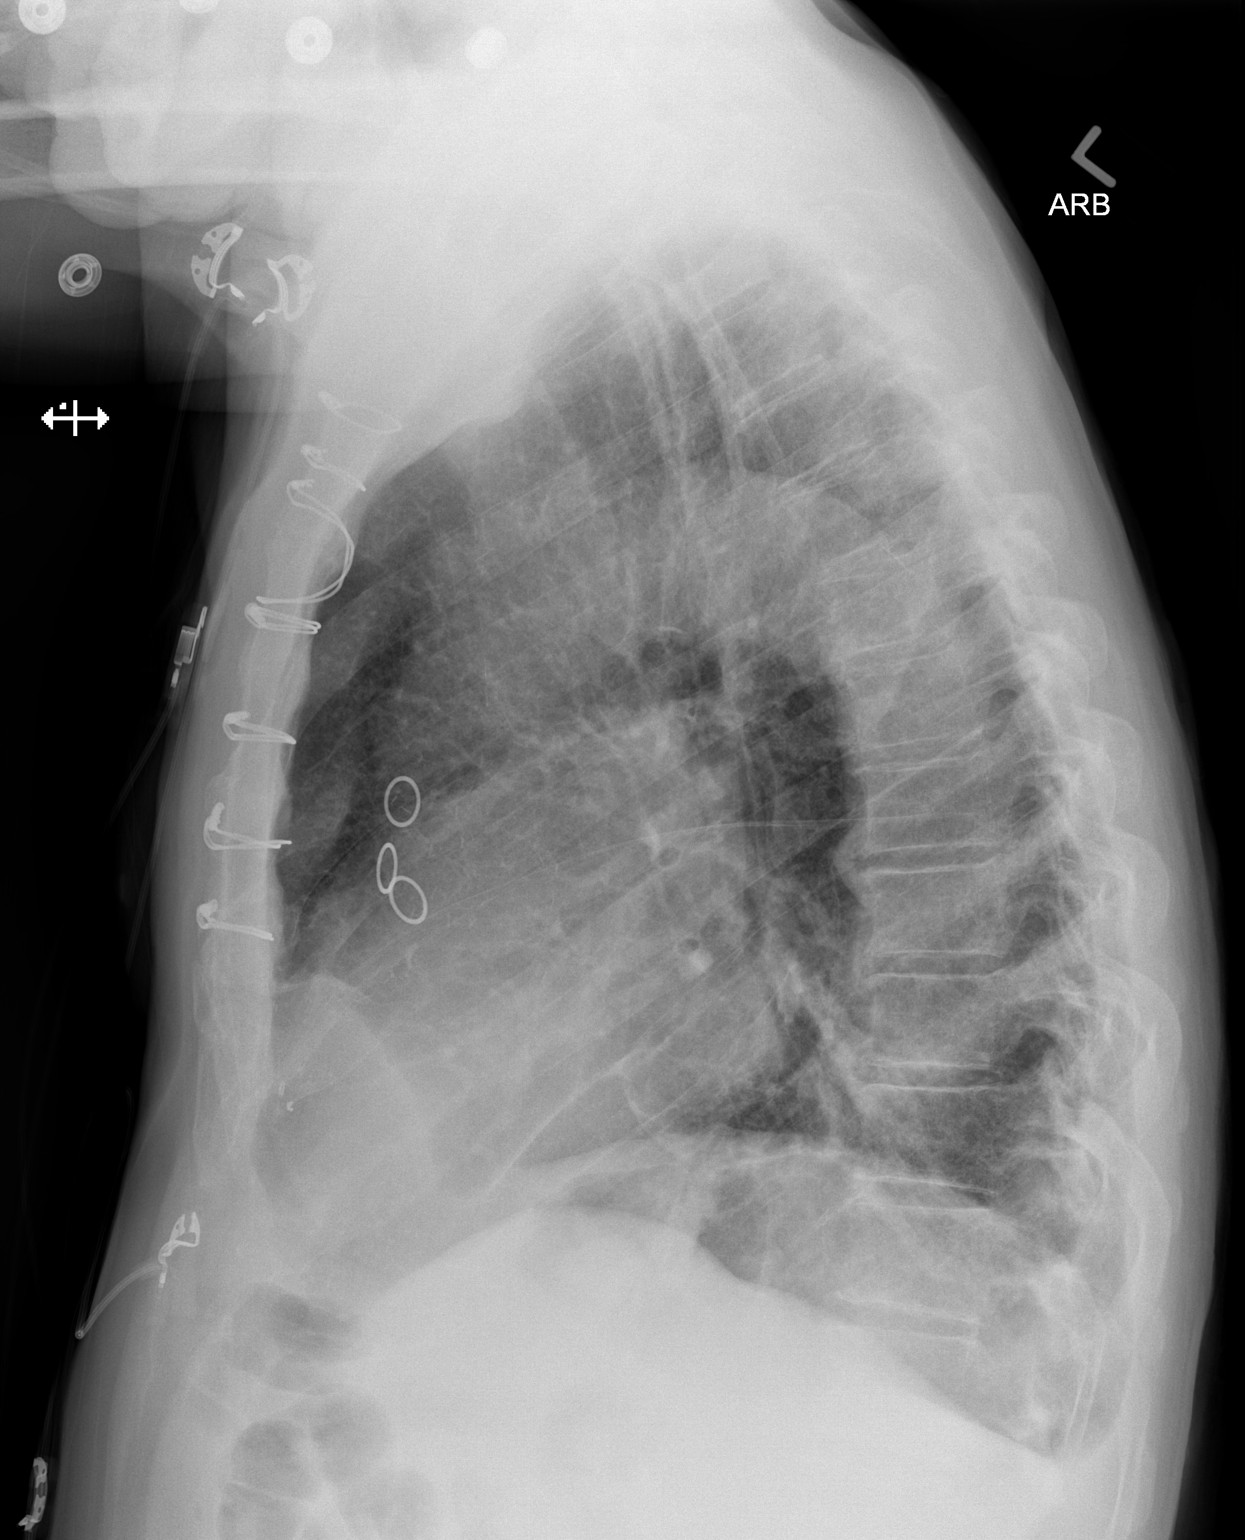

[2 of 2 positions shown; findings below may reference images not displayed]

FINDINGS: Stable cardiomegaly. No pneumothorax is noted. Status post coronary
artery bypass graft. Minimal left pleural effusion is noted. Minimal
bibasilar subsegmental atelectasis is noted. Bony thorax is intact.
IMPRESSION: Minimal left pleural effusion. Minimal bibasilar subsegmental
atelectasis.

## 2015-02-19 MED ORDER — IPRATROPIUM-ALBUTEROL 0.5-2.5 (3) MG/3ML IN SOLN
3.0000 mL | Freq: Once | RESPIRATORY_TRACT | Status: AC
  Administered 2015-02-19: 3 mL via RESPIRATORY_TRACT
  Filled 2015-02-19: qty 3

## 2015-02-19 MED ORDER — LORAZEPAM 2 MG/ML IJ SOLN
INTRAMUSCULAR | Status: AC
  Administered 2015-02-19: 1 mg via INTRAVENOUS
  Filled 2015-02-19: qty 1

## 2015-02-19 MED ORDER — IPRATROPIUM-ALBUTEROL 0.5-2.5 (3) MG/3ML IN SOLN
6.0000 mL | Freq: Once | RESPIRATORY_TRACT | Status: AC
  Administered 2015-02-19: 6 mL via RESPIRATORY_TRACT
  Filled 2015-02-19: qty 6

## 2015-02-19 MED ORDER — DEXTROSE 5 % IV SOLN
500.0000 mg | Freq: Once | INTRAVENOUS | Status: AC
  Administered 2015-02-19: 500 mg via INTRAVENOUS
  Filled 2015-02-19: qty 500

## 2015-02-19 MED ORDER — PREDNISONE 20 MG PO TABS: 40.0000 mg | ORAL_TABLET | ORAL | Status: DC

## 2015-02-19 MED ORDER — CEFTRIAXONE SODIUM 1 G IJ SOLR
1.0000 g | INTRAMUSCULAR | Status: AC
  Administered 2015-02-19: 1 g via INTRAVENOUS
  Filled 2015-02-19: qty 10

## 2015-02-19 MED ORDER — LORAZEPAM 2 MG/ML IJ SOLN
1.0000 mg | Freq: Once | INTRAMUSCULAR | Status: AC
  Administered 2015-02-19: 1 mg via INTRAVENOUS

## 2015-02-19 MED ORDER — METHYLPREDNISOLONE SODIUM SUCC 125 MG IJ SOLR
125.0000 mg | Freq: Once | INTRAMUSCULAR | Status: AC
  Administered 2015-02-19: 125 mg via INTRAVENOUS
  Filled 2015-02-19: qty 2

## 2015-02-19 MED ORDER — ALBUTEROL SULFATE (2.5 MG/3ML) 0.083% IN NEBU
5.0000 mg | INHALATION_SOLUTION | Freq: Once | RESPIRATORY_TRACT | Status: DC
Start: 2015-02-19 — End: 2015-02-19

## 2015-02-19 MED ORDER — MORPHINE SULFATE (PF) 4 MG/ML IV SOLN
4.0000 mg | Freq: Once | INTRAVENOUS | Status: AC
  Administered 2015-02-19: 4 mg via INTRAVENOUS
  Filled 2015-02-19: qty 1

## 2015-02-19 MED ORDER — ONDANSETRON HCL 4 MG/2ML IJ SOLN
4.0000 mg | Freq: Once | INTRAMUSCULAR | Status: AC
  Administered 2015-02-19: 4 mg via INTRAVENOUS
  Filled 2015-02-19: qty 2

## 2015-02-19 NOTE — ED Notes (Signed)
[phone] Haywood L[facility]Lynette [facility] called to up date on pt. RN will call back

## 2015-02-19 NOTE — ED Provider Notes (Signed)
[facility] Emergency Department Provider Note  ____________________________________________  Time seen: Approximately 6:42 PM  I have reviewed the triage vital signs and the nursing notes.   HISTORY  Chief Complaint Shortness of Breath  History limited by agitation  HPI Darrell Mcconnell is a 79 y.o. male with a history of COPD who uses 2 L of oxygen at baseline who presents from his nursing facility by EMS for evaluation of shortness of breath.  He states that his shortness of breath has been getting gradually worse over the last 2 days, although he reports coughing for the last 2 weeks.  In fact he was seen in the emergency department approximately one month ago for cough although it sounds as if his shortness of breath was not as bad at that time.  He is quite agitated and anxious with many concerns and complaints of feeling like his shortness of breath is quite severe though his respiratory rate is normal and his oxygen saturation is 99% on 2 L.  He wants somebody to stay with him at all times and commented that his wife passed away about 2 years ago.  He denies chest pain, abdominal pain, nausea/vomiting, fever/chills, headache, visual changes, nasal congestion.Describes his symptoms as severe and gradual in onset, nothing makes it better and exertion makes it slightly worse.   Past Medical History  Diagnosis Date  . Chronic systolic CHF (congestive heart failure) (HCC)   . COPD (chronic obstructive pulmonary disease) (HCC)   . Aorta aneurysm (HCC)   . Asthma   . Hypertension   . Anxiety   . A-fib (HCC)   . Depression   . Hypothyroidism   . GERD (gastroesophageal reflux disease)   . Dementia   . BPH (benign prostatic hyperplasia)     Patient Active Problem List   Diagnosis Date Noted  . Chronic systolic CHF (congestive heart failure) (HCC) 01/04/2015  . GERD (gastroesophageal reflux disease) 01/04/2015  . HTN (hypertension) 01/04/2015  . Anxiety  01/04/2015  . Chronic a-fib (HCC) 01/04/2015  . Dementia 01/04/2015  . COPD (chronic obstructive pulmonary disease) (HCC) 01/04/2015  . BPH (benign prostatic hyperplasia) 01/04/2015  . HCAP (healthcare-associated pneumonia) 07/04/2014  . COPD exacerbation (HCC) 07/04/2014  . Aneurysm of aorta (HCC) 07/04/2014    Past Surgical History  Procedure Laterality Date  . Coronary artery bypass graft    . Prostate surgery    . Abdominal aortic aneurysm repair      Current Outpatient Rx  Name  Route  Sig  Dispense  Refill  . albuterol (PROVENTIL HFA;VENTOLIN HFA) 108 (90 Base) MCG/ACT inhaler   Inhalation   Inhale 1 puff into the lungs every 6 (six) hours as needed for wheezing or shortness of breath.         . ALPRAZolam (XANAX) 0.25 MG tablet   Oral   Take 0.25 mg by mouth every 8 (eight) hours as needed for anxiety.          Marland Kitchen aspirin EC 81 MG tablet   Oral   Take 81 mg by mouth every evening.          . bimatoprost (LUMIGAN) 0.01 % SOLN   Both Eyes   Place 1 drop into both eyes at bedtime.          . brimonidine-timolol (COMBIGAN) 0.2-0.5 % ophthalmic solution   Both Eyes   Place 1 drop into both eyes 2 (two) times daily.         . cholecalciferol (  VITAMIN D) 400 UNITS TABS tablet   Oral   Take 400 Units by mouth daily.         . digoxin (LANOXIN) 0.125 MG tablet   Oral   Take 0.125 mg by mouth daily.         Marland Kitchen. docusate sodium (COLACE) 100 MG capsule   Oral   Take 100 mg by mouth 2 (two) times daily.         Marland Kitchen. escitalopram (LEXAPRO) 10 MG tablet   Oral   Take 15 mg by mouth daily.         . finasteride (PROSCAR) 5 MG tablet   Oral   Take 5 mg by mouth daily.         . Fluticasone-Salmeterol (ADVAIR) 250-50 MCG/DOSE AEPB   Inhalation   Inhale 1 puff into the lungs 2 (two) times daily.         . furosemide (LASIX) 40 MG tablet   Oral   Take 40 mg by mouth daily.         . hydrocortisone cream 1 %   Topical   Apply 1 application  topically as needed for itching (and rash).          Marland Kitchen. ipratropium-albuterol (DUONEB) 0.5-2.5 (3) MG/3ML SOLN   Nebulization   Take 3 mLs by nebulization 3 (three) times daily as needed (for shortness of breath/wheezing).          Marland Kitchen. levothyroxine (SYNTHROID, LEVOTHROID) 25 MCG tablet   Oral   Take 25 mcg by mouth daily.          Marland Kitchen. lisinopril (PRINIVIL,ZESTRIL) 2.5 MG tablet   Oral   Take 2.5 mg by mouth daily.         . meloxicam (MOBIC) 7.5 MG tablet   Oral   Take 7.5 mg by mouth daily.         . metoCLOPramide (REGLAN) 10 MG tablet   Oral   Take 10 mg by mouth 3 (three) times daily before meals.         . metoprolol tartrate (LOPRESSOR) 25 MG tablet   Oral   Take 12.5 mg by mouth daily.          . pantoprazole (PROTONIX) 40 MG tablet   Oral   Take 40 mg by mouth daily.         . polyethylene glycol (MIRALAX / GLYCOLAX) packet   Oral   Take 17 g by mouth daily as needed for moderate constipation.         . Prenatal Vit-Fe Fumarate-FA (PREPLUS) 27-1 MG TABS   Oral   Take 1 tablet by mouth daily.         . QUEtiapine (SEROQUEL) 25 MG tablet   Oral   Take 25 mg by mouth 3 (three) times daily.         . QUEtiapine (SEROQUEL) 50 MG tablet   Oral   Take 50 mg by mouth at bedtime.         . Skin Protectants, Misc. (EUCERIN) cream   Topical   Apply 1 application topically 2 (two) times daily. Pt applies to arms and legs.           Allergies Review of patient's allergies indicates no known allergies.  Family History  Problem Relation Age of Onset  . CAD Father     Social History Social History  Substance Use Topics  . Smoking status: Former Games developermoker  . Smokeless tobacco: None  .  Alcohol Use: No    Review of Systems Constitutional: No fever/chills Eyes: No visual changes. ENT: No sore throat. Cardiovascular: Denies chest pain. Respiratory: Gradual onset and severe shortness of breath and cough productive of clear  sputum Gastrointestinal: No abdominal pain.  No nausea, no vomiting.  No diarrhea.  No constipation. Genitourinary: Negative for dysuria. Musculoskeletal: Negative for back pain. Skin: Negative for rash. Neurological: Negative for headaches, focal weakness or numbness.  10-point ROS otherwise negative.  ____________________________________________   PHYSICAL EXAM:  VITAL SIGNS: ED Triage Vitals  Enc Vitals Group     BP 02/19/15 1827 158/97 mmHg     Pulse Rate 02/19/15 1827 81     Resp --      Temp 02/19/15 1827 98.2 F (36.8 C)     Temp Source 02/19/15 1827 Oral     SpO2 02/19/15 1818 96 %     Weight 02/19/15 1827 148 lb (67.132 kg)     Height 02/19/15 1827 5\' 9"  (1.753 m)     Head Cir --      Peak Flow --      Pain Score 02/19/15 1828 8     Pain Loc --      Pain Edu? --      Excl. in GC? --     Constitutional: Alert and oriented. Well appearing and in no acute distress. Eyes: Conjunctivae are normal. PERRL. EOMI. Head: Atraumatic. Nose: No congestion/rhinnorhea. Mouth/Throat: Mucous membranes are moist.  Oropharynx non-erythematous. Neck: No stridor.   Cardiovascular: Normal rate, regular rhythm. Grossly normal heart sounds.  Good peripheral circulation. Respiratory: Normal respiratory effort.  No retractions. Mild Expiratory wheezes throughout lung fields bilaterally. Gastrointestinal: Soft and nontender. No distention. No abdominal bruits. No CVA tenderness. Musculoskeletal: No lower extremity tenderness nor edema.  No joint effusions. Neurologic:  Normal speech and language. No gross focal neurologic deficits are appreciated.  Skin:  Skin is warm, dry and intact. No rash noted. Psychiatric: Mood and affect are anxious and agitated but also alert and oriented.  ____________________________________________   LABS (all labs ordered are listed, but only abnormal results are displayed)  Labs Reviewed  CBC - Abnormal; Notable for the following:    RBC 4.31 (*)     Hemoglobin 12.1 (*)    HCT 35.9 (*)    RDW 16.2 (*)    Platelets 140 (*)    All other components within normal limits  COMPREHENSIVE METABOLIC PANEL - Abnormal; Notable for the following:    Glucose, Bld 101 (*)    ALT 13 (*)    All other components within normal limits  TROPONIN I - Abnormal; Notable for the following:    Troponin I 0.04 (*)    All other components within normal limits  CULTURE, BLOOD (ROUTINE X 2)  CULTURE, BLOOD (ROUTINE X 2)  TROPONIN I   ____________________________________________  EKG  ED ECG REPORT I, Keyosha Tiedt, the attending physician, personally viewed and interpreted this ECG.   Date: 02/19/2015  EKG Time: 18:00  Rate: 82  Rhythm: atrial fibrillation, rate 80 to  Axis: Normal  Intervals:nonspecific intraventricular conduction delay  ST&T Change: Non-specific ST segment / T-wave changes, but no evidence of acute ischemia.   ____________________________________________  RADIOLOGY   Dg Chest 2 View  02/19/2015  CLINICAL DATA:  Increasing shortness of breath over 2 days EXAM: CHEST - 2 VIEW COMPARISON:  01/23/2015 FINDINGS: Cardiac shadow is not mildly enlarged. Postsurgical changes are again seen. Increasing density is noted in the right  lung base consistent with some evolving infiltrate. No other focal abnormality is noted. IMPRESSION: Evolving infiltrate in the right base. Electronically Signed   By: Alcide Clever M.D.   On: 02/19/2015 20:00    ____________________________________________   PROCEDURES  Procedure(s) performed: None  Critical Care performed: No ____________________________________________   INITIAL IMPRESSION / ASSESSMENT AND PLAN / ED COURSE  Pertinent labs & imaging results that were available during my care of the patient were reviewed by me and considered in my medical decision making (see chart for details).  The patient appears to mostly be suffering from acute anxiety and agitation associated with some  chronic shortness of breath.  His vital signs are all stable including his heart rate, respiratory rate, and oxygenation given his baseline level of oxygen use.  I will work him up as a possible COPD exacerbation although his lungs are clear to auscultation at this time.  I am giving him 1 mg of Ativan IV as well as a DuoNeb to see if both of these help calm him down and subjectively improve his breathing.   (Note that documentation was delayed due to multiple ED patients requiring immediate care.)   The patient has an infiltrate on his chest x-ray which explains his worsening shortness of breath and cough.  He is also suffering from what I believe is an acute exacerbation of his COPD given his wheezing.  I am treating him with Solu-Medrol and DuoNeb times 3.  His initial troponin of 0.04 improved to negative on his repeat, but I will admit him for his respiratory distress, community-acquired pneumonia, acute COPD exacerbation, and comorbidities.  The patient agrees with this plan.  Blood cultures 2 were obtained prior to ceftriaxone and azithromycin.  He does not meet criteria for sepsis. ____________________________________________  FINAL CLINICAL IMPRESSION(S) / ED DIAGNOSES  Final diagnoses:  Community acquired pneumonia  COPD with acute exacerbation (HCC)      NEW MEDICATIONS STARTED DURING THIS VISIT:  New Prescriptions   No medications on file     Loleta Rose, MD 02/19/15 2339

## 2015-02-19 NOTE — H&P (Signed)
[facility] - [place] [facility]   PATIENT NAME: Darrell Mcconnell    MR#:  [id number]  DATE OF BIRTH:  11-Mar-1935  DATE OF ADMISSION:  02/19/2015  PRIMARY CARE PHYSICIAN: Lyndon Code, MD   REQUESTING/REFERRING PHYSICIAN: York Cerise, MD  CHIEF COMPLAINT:   Chief Complaint  Patient presents with  . Shortness of Breath    HISTORY OF PRESENT ILLNESS:  Darrell Mcconnell  is a 79 y.o. male who presents with progressive shortness of breath for the past 3 days with increased sputum production and cough.  Patient has COPD at baseline, but lives in a nursing facility and on evaluation here in the ED was found to have RLL infiltrate on CXR.  Treated with antibiotics and IV steroids and nebs in ED.  Hospitalists were called for admission.   PAST MEDICAL HISTORY:   Past Medical History  Diagnosis Date  . Chronic systolic CHF (congestive heart failure) (HCC)   . COPD (chronic obstructive pulmonary disease) (HCC)   . Aorta aneurysm (HCC)   . Asthma   . Hypertension   . Anxiety   . A-fib (HCC)   . Depression   . Hypothyroidism   . GERD (gastroesophageal reflux disease)   . Dementia   . BPH (benign prostatic hyperplasia)     PAST SURGICAL HISTORY:   Past Surgical History  Procedure Laterality Date  . Coronary artery bypass graft    . Prostate surgery    . Abdominal aortic aneurysm repair      SOCIAL HISTORY:   Social History  Substance Use Topics  . Smoking status: Former Games developer  . Smokeless tobacco: Not on file  . Alcohol Use: No    FAMILY HISTORY:   Family History  Problem Relation Age of Onset  . CAD Father     DRUG ALLERGIES:  No Known Allergies  MEDICATIONS AT HOME:   Prior to Admission medications   Medication Sig Start Date End Date Taking? Authorizing Provider  albuterol (PROVENTIL HFA;VENTOLIN HFA) 108 (90 Base) MCG/ACT inhaler Inhale 1 puff into the lungs every 6 (six) hours as needed for wheezing or shortness of breath.   Yes Historical  Provider, MD  ALPRAZolam (XANAX) 0.25 MG tablet Take 0.25 mg by mouth every 8 (eight) hours as needed for anxiety.    Yes Historical Provider, MD  aspirin EC 81 MG tablet Take 81 mg by mouth every evening.    Yes Historical Provider, MD  bimatoprost (LUMIGAN) 0.01 % SOLN Place 1 drop into both eyes at bedtime.    Yes Historical Provider, MD  brimonidine-timolol (COMBIGAN) 0.2-0.5 % ophthalmic solution Place 1 drop into both eyes 2 (two) times daily.   Yes Historical Provider, MD  cholecalciferol (VITAMIN D) 400 UNITS TABS tablet Take 400 Units by mouth daily.   Yes Historical Provider, MD  digoxin (LANOXIN) 0.125 MG tablet Take 0.125 mg by mouth daily.   Yes Historical Provider, MD  docusate sodium (COLACE) 100 MG capsule Take 100 mg by mouth 2 (two) times daily.   Yes Historical Provider, MD  escitalopram (LEXAPRO) 10 MG tablet Take 15 mg by mouth daily.   Yes Historical Provider, MD  finasteride (PROSCAR) 5 MG tablet Take 5 mg by mouth daily.   Yes Historical Provider, MD  Fluticasone-Salmeterol (ADVAIR) 250-50 MCG/DOSE AEPB Inhale 1 puff into the lungs 2 (two) times daily.   Yes Historical Provider, MD  furosemide (LASIX) 40 MG tablet Take 40 mg by mouth daily.   Yes Historical Provider,  MD  hydrocortisone cream 1 % Apply 1 application topically as needed for itching (and rash).    Yes Historical Provider, MD  ipratropium-albuterol (DUONEB) 0.5-2.5 (3) MG/3ML SOLN Take 3 mLs by nebulization 3 (three) times daily as needed (for shortness of breath/wheezing).    Yes Historical Provider, MD  levothyroxine (SYNTHROID, LEVOTHROID) 25 MCG tablet Take 25 mcg by mouth daily.    Yes Historical Provider, MD  lisinopril (PRINIVIL,ZESTRIL) 2.5 MG tablet Take 2.5 mg by mouth daily.   Yes Historical Provider, MD  meloxicam (MOBIC) 7.5 MG tablet Take 7.5 mg by mouth daily.   Yes Historical Provider, MD  metoCLOPramide (REGLAN) 10 MG tablet Take 10 mg by mouth 3 (three) times daily before meals.   Yes Historical  Provider, MD  metoprolol tartrate (LOPRESSOR) 25 MG tablet Take 12.5 mg by mouth daily.    Yes Historical Provider, MD  pantoprazole (PROTONIX) 40 MG tablet Take 40 mg by mouth daily.   Yes Historical Provider, MD  polyethylene glycol (MIRALAX / GLYCOLAX) packet Take 17 g by mouth daily as needed for moderate constipation.   Yes Historical Provider, MD  Prenatal Vit-Fe Fumarate-FA (PREPLUS) 27-1 MG TABS Take 1 tablet by mouth daily.   Yes Historical Provider, MD  QUEtiapine (SEROQUEL) 25 MG tablet Take 25 mg by mouth 3 (three) times daily.   Yes Historical Provider, MD  QUEtiapine (SEROQUEL) 50 MG tablet Take 50 mg by mouth at bedtime.   Yes Historical Provider, MD  Skin Protectants, Misc. (EUCERIN) cream Apply 1 application topically 2 (two) times daily. Pt applies to arms and legs.   Yes Historical Provider, MD    REVIEW OF SYSTEMS:  Review of Systems  Constitutional: Positive for malaise/fatigue. Negative for fever, chills and weight loss.  HENT: Negative for ear pain, hearing loss and tinnitus.   Eyes: Negative for blurred vision, double vision, pain and redness.  Respiratory: Positive for cough, sputum production, shortness of breath and wheezing. Negative for hemoptysis.   Cardiovascular: Negative for chest pain, palpitations, orthopnea and leg swelling.  Gastrointestinal: Negative for nausea, vomiting, abdominal pain, diarrhea and constipation.  Genitourinary: Negative for dysuria, frequency and hematuria.  Musculoskeletal: Negative for back pain, joint pain and neck pain.  Skin:       No acne, rash, or lesions  Neurological: Negative for dizziness, tremors, focal weakness and weakness.  Endo/Heme/Allergies: Negative for polydipsia. Does not bruise/bleed easily.  Psychiatric/Behavioral: Negative for depression. The patient is not nervous/anxious and does not have insomnia.      VITAL SIGNS:   Filed Vitals:   02/19/15 1818 02/19/15 1827 02/19/15 1848 02/19/15 2100  BP:  158/97  147/93 155/93  Pulse:  81 66 86  Temp:  98.2 F (36.8 C)    TempSrc:  Oral    Resp:   17 18  Height:  5\' 9"  (1.753 m)    Weight:  67.132 kg (148 lb)    SpO2: 96% 97% 98% 97%   Wt Readings from Last 3 Encounters:  02/19/15 67.132 kg (148 lb)  01/23/15 67.586 kg (149 lb)  01/09/15 65.363 kg (144 lb 1.6 oz)    PHYSICAL EXAMINATION:  Physical Exam  Vitals reviewed. Constitutional: He is oriented to person, place, and time. He appears well-developed and well-nourished. No distress.  HENT:  Head: Normocephalic and atraumatic.  Mouth/Throat: Oropharynx is clear and moist.  Eyes: Conjunctivae and EOM are normal. Pupils are equal, round, and reactive to light. No scleral icterus.  Neck: Normal range of motion.  Neck supple. No JVD present. No thyromegaly present.  Cardiovascular: Normal rate, regular rhythm and intact distal pulses.  Exam reveals no gallop and no friction rub.   No murmur heard. Respiratory: No respiratory distress. He has wheezes (diffuse, mild). He has no rales.  Right lower lobe ronchi  GI: Soft. Bowel sounds are normal. He exhibits no distension. There is no tenderness.  Musculoskeletal: Normal range of motion. He exhibits no edema.  No arthritis, no gout  Lymphadenopathy:    He has no cervical adenopathy.  Neurological: He is alert and oriented to person, place, and time. No cranial nerve deficit.  No dysarthria, no aphasia  Skin: Skin is warm and dry. No rash noted. No erythema.  Psychiatric: He has a normal mood and affect. His behavior is normal. Judgment and thought content normal.    LABORATORY PANEL:   CBC  Recent Labs Lab 02/19/15 1833  WBC 9.1  HGB 12.1*  HCT 35.9*  PLT 140*   ------------------------------------------------------------------------------------------------------------------  Chemistries   Recent Labs Lab 02/19/15 1833  NA 141  K 3.5  CL 103  CO2 28  GLUCOSE 101*  BUN 18  CREATININE 0.97  CALCIUM 9.2  AST 21  ALT  13*  ALKPHOS 81  BILITOT 0.5   ------------------------------------------------------------------------------------------------------------------  Cardiac Enzymes  Recent Labs Lab 02/19/15 2152  TROPONINI <0.03   ------------------------------------------------------------------------------------------------------------------  RADIOLOGY:  Dg Chest 2 View  02/19/2015  CLINICAL DATA:  Increasing shortness of breath over 2 days EXAM: CHEST - 2 VIEW COMPARISON:  01/23/2015 FINDINGS: Cardiac shadow is not mildly enlarged. Postsurgical changes are again seen. Increasing density is noted in the right lung base consistent with some evolving infiltrate. No other focal abnormality is noted. IMPRESSION: Evolving infiltrate in the right base. Electronically Signed   By: Alcide Clever M.D.   On: 02/19/2015 20:00    EKG:   Orders placed or performed during the hospital encounter of 02/19/15  . ED EKG  . ED EKG    IMPRESSION AND PLAN:  Principal Problem:   HCAP (healthcare-associated pneumonia) - IV antibiotics started in ED.  Blood cultures sent.  Will order sputum culture and continue antibiotics - zosyn.  PRN antitussive Active Problems:   COPD exacerbation (HCC) - due to his PNA.  Continue IV steroids and nebs prn.  Continue home inhalers   Chronic systolic CHF (congestive heart failure) (HCC) - continue home diuretic   HTN (hypertension) - stable, continue home meds   Chronic a-fib (HCC) - continue home rate controlling meds   Dementia - stable, monitor and continue behavioral meds except for seroquel as his QTc was >500.  Repeat ekg in AM.   GERD (gastroesophageal reflux disease) - home dose PPI   Anxiety - continue home anxilytics   BPH (benign prostatic hyperplasia) - continue home meds  All the records are reviewed and case discussed with ED provider. Management plans discussed with the patient and/or family.  DVT PROPHYLAXIS: SubQ lovenox  GI PROPHYLAXIS: PPI  ADMISSION  STATUS: Inpatient  CODE STATUS: DNR Advance Directive Documentation        Most Recent Value   Type of Advance Directive  Out of facility DNR (pink MOST or yellow form)   Pre-existing out of facility DNR order (yellow form or pink MOST form)     "MOST" Form in Place?        TOTAL TIME TAKING CARE OF THIS PATIENT: 40 minutes.    Cosimo Schertzer FIELDING 02/19/2015, 11:41 PM  [employer] [facility]  Office  [phone]  CC: Primary care physician; Lavera Guise, MD

## 2015-02-19 NOTE — ED Notes (Signed)
Patient presents to the ED via [facility] from [facility].  Patient reports increased shortness of breath the past two days.  Patient reports coughing up clear mucous.  Patient is hard of hearing and visually impaired.  Patient is very anxious.  Patient is alert and oriented x 4.  Patient uses 2L of oxygen nasal cannula all the time.  Patient is verry demanding, asking for water, stating he is cold and patient is very frustrated.  Patient states, "I wish I would die a thousand times than be short of breath."  Breath sounds are diminished bilaterally.  No wheezing or rhonchi noted.  Patient is occasionally tearful.  Patient's wife died 2 years ago.

## 2015-02-19 NOTE — ED Notes (Signed)
Trop 0.04 per lab md notified

## 2015-02-20 DIAGNOSIS — I10 Essential (primary) hypertension: Secondary | ICD-10-CM | POA: Diagnosis not present

## 2015-02-20 DIAGNOSIS — J189 Pneumonia, unspecified organism: Secondary | ICD-10-CM | POA: Diagnosis not present

## 2015-02-20 DIAGNOSIS — I482 Chronic atrial fibrillation: Secondary | ICD-10-CM | POA: Diagnosis not present

## 2015-02-20 DIAGNOSIS — J441 Chronic obstructive pulmonary disease with (acute) exacerbation: Secondary | ICD-10-CM | POA: Diagnosis not present

## 2015-02-20 LAB — CBC
HCT: 33.8 % — ABNORMAL LOW (ref 40.0–52.0)
Hemoglobin: 11.3 g/dL — ABNORMAL LOW (ref 13.0–18.0)
MCH: 27.9 pg (ref 26.0–34.0)
MCHC: 33.4 g/dL (ref 32.0–36.0)
MCV: 83.3 fL (ref 80.0–100.0)
Platelets: 134 K/uL — ABNORMAL LOW (ref 150–440)
RBC: 4.05 MIL/uL — ABNORMAL LOW (ref 4.40–5.90)
RDW: 16 % — ABNORMAL HIGH (ref 11.5–14.5)
WBC: 7 K/uL (ref 3.8–10.6)

## 2015-02-20 LAB — CREATININE, SERUM
Creatinine, Ser: 1.03 mg/dL (ref 0.61–1.24)
GFR calc Af Amer: >60 mL/min
GFR calc non Af Amer: >60 mL/min

## 2015-02-20 MED ORDER — DOXYCYCLINE HYCLATE 50 MG PO CAPS: 100.0000 mg | ORAL_CAPSULE | Freq: Two times a day (BID) | ORAL | Status: DC

## 2015-02-20 MED ORDER — ENOXAPARIN SODIUM 40 MG/0.4ML ~~LOC~~ SOLN: 40.0000 mg | SUBCUTANEOUS | Status: DC

## 2015-02-20 MED ORDER — ESCITALOPRAM OXALATE 10 MG PO TABS
15.0000 mg | ORAL_TABLET | Freq: Every day | ORAL | Status: DC
  Administered 2015-02-20: 15 mg via ORAL
  Filled 2015-02-20: qty 2

## 2015-02-20 MED ORDER — ONDANSETRON HCL 4 MG/2ML IJ SOLN: 4.0000 mg | Freq: Four times a day (QID) | INTRAMUSCULAR | Status: DC | PRN

## 2015-02-20 MED ORDER — LEVOTHYROXINE SODIUM 50 MCG PO TABS: 25.0000 ug | ORAL_TABLET | Freq: Every day | ORAL | Status: DC

## 2015-02-20 MED ORDER — FUROSEMIDE 40 MG PO TABS
40.0000 mg | ORAL_TABLET | Freq: Every day | ORAL | Status: DC
  Administered 2015-02-20: 40 mg via ORAL
  Filled 2015-02-20: qty 1

## 2015-02-20 MED ORDER — PREDNISONE 20 MG PO TABS
60.0000 mg | ORAL_TABLET | Freq: Every day | ORAL | Status: DC
  Administered 2015-02-20: 60 mg via ORAL
  Filled 2015-02-20: qty 3

## 2015-02-20 MED ORDER — FINASTERIDE 5 MG PO TABS
5.0000 mg | ORAL_TABLET | Freq: Every day | ORAL | Status: DC
  Administered 2015-02-20: 5 mg via ORAL
  Filled 2015-02-20: qty 1

## 2015-02-20 MED ORDER — ACETAMINOPHEN 325 MG PO TABS
650.0000 mg | ORAL_TABLET | Freq: Four times a day (QID) | ORAL | Status: DC | PRN
  Administered 2015-02-20: 650 mg via ORAL
  Filled 2015-02-20: qty 2

## 2015-02-20 MED ORDER — PANTOPRAZOLE SODIUM 40 MG PO TBEC
40.0000 mg | DELAYED_RELEASE_TABLET | Freq: Every day | ORAL | Status: DC
  Administered 2015-02-20: 40 mg via ORAL
  Filled 2015-02-20: qty 1

## 2015-02-20 MED ORDER — TIMOLOL MALEATE 0.5 % OP SOLN
1.0000 [drp] | Freq: Two times a day (BID) | OPHTHALMIC | Status: DC
  Administered 2015-02-20: 1 [drp] via OPHTHALMIC
  Filled 2015-02-20 (×2): qty 5

## 2015-02-20 MED ORDER — ONDANSETRON HCL 4 MG PO TABS: 4.0000 mg | ORAL_TABLET | Freq: Four times a day (QID) | ORAL | Status: DC | PRN

## 2015-02-20 MED ORDER — PIPERACILLIN-TAZOBACTAM 4.5 G IVPB
4.5000 g | Freq: Three times a day (TID) | INTRAVENOUS | Status: DC
  Administered 2015-02-20: 4.5 g via INTRAVENOUS
  Filled 2015-02-20 (×3): qty 100

## 2015-02-20 MED ORDER — METOPROLOL TARTRATE 25 MG PO TABS
12.5000 mg | ORAL_TABLET | Freq: Every day | ORAL | Status: DC
  Administered 2015-02-20: 12.5 mg via ORAL
  Filled 2015-02-20: qty 1

## 2015-02-20 MED ORDER — BRIMONIDINE TARTRATE-TIMOLOL 0.2-0.5 % OP SOLN
1.0000 [drp] | Freq: Two times a day (BID) | OPHTHALMIC | Status: DC
  Filled 2015-02-20: qty 5

## 2015-02-20 MED ORDER — ACETAMINOPHEN 650 MG RE SUPP
650.0000 mg | Freq: Four times a day (QID) | RECTAL | Status: DC | PRN
Start: 2015-02-20 — End: 2015-02-20

## 2015-02-20 MED ORDER — LISINOPRIL 5 MG PO TABS
2.5000 mg | ORAL_TABLET | Freq: Every day | ORAL | Status: DC
  Administered 2015-02-20: 2.5 mg via ORAL
  Filled 2015-02-20: qty 1

## 2015-02-20 MED ORDER — LORAZEPAM 0.5 MG PO TABS
0.5000 mg | ORAL_TABLET | Freq: Once | ORAL | Status: AC
  Administered 2015-02-20: 0.5 mg via ORAL
  Filled 2015-02-20: qty 1

## 2015-02-20 MED ORDER — ALPRAZOLAM 0.25 MG PO TABS
0.2500 mg | ORAL_TABLET | Freq: Three times a day (TID) | ORAL | Status: DC | PRN
  Administered 2015-02-20: 0.25 mg via ORAL
  Filled 2015-02-20: qty 1

## 2015-02-20 MED ORDER — LEVOFLOXACIN 750 MG PO TABS: 750.0000 mg | ORAL_TABLET | Freq: Every day | ORAL | Status: DC

## 2015-02-20 MED ORDER — SODIUM CHLORIDE 0.9 % IJ SOLN
3.0000 mL | Freq: Two times a day (BID) | INTRAMUSCULAR | Status: DC
  Administered 2015-02-20 (×2): 3 mL via INTRAVENOUS

## 2015-02-20 MED ORDER — LATANOPROST 0.005 % OP SOLN
1.0000 [drp] | Freq: Every day | OPHTHALMIC | Status: DC
  Filled 2015-02-20: qty 2.5

## 2015-02-20 MED ORDER — PREDNISONE 10 MG PO TABS: 10.0000 mg | ORAL_TABLET | Freq: Every day | ORAL | Status: DC

## 2015-02-20 MED ORDER — DIGOXIN 125 MCG PO TABS
0.1250 mg | ORAL_TABLET | Freq: Every day | ORAL | Status: DC
  Administered 2015-02-20: 0.125 mg via ORAL
  Filled 2015-02-20: qty 1

## 2015-02-20 MED ORDER — METHYLPREDNISOLONE SODIUM SUCC 125 MG IJ SOLR
60.0000 mg | Freq: Four times a day (QID) | INTRAMUSCULAR | Status: DC
  Administered 2015-02-20: 60 mg via INTRAVENOUS
  Filled 2015-02-20 (×2): qty 2

## 2015-02-20 MED ORDER — BRIMONIDINE TARTRATE 0.2 % OP SOLN
1.0000 [drp] | Freq: Two times a day (BID) | OPHTHALMIC | Status: DC
  Administered 2015-02-20 (×2): 1 [drp] via OPHTHALMIC
  Filled 2015-02-20: qty 5

## 2015-02-20 MED ORDER — IPRATROPIUM-ALBUTEROL 0.5-2.5 (3) MG/3ML IN SOLN: 3.0000 mL | RESPIRATORY_TRACT | Status: DC | PRN

## 2015-02-20 MED ORDER — MOMETASONE FURO-FORMOTEROL FUM 100-5 MCG/ACT IN AERO
2.0000 | INHALATION_SPRAY | Freq: Two times a day (BID) | RESPIRATORY_TRACT | Status: DC
  Administered 2015-02-20: 2 via RESPIRATORY_TRACT
  Filled 2015-02-20: qty 8.8

## 2015-02-20 MED ORDER — MORPHINE SULFATE (PF) 4 MG/ML IV SOLN
4.0000 mg | Freq: Once | INTRAVENOUS | Status: AC
  Administered 2015-02-20: 4 mg via INTRAVENOUS
  Filled 2015-02-20: qty 1

## 2015-02-20 MED ORDER — METOCLOPRAMIDE HCL 10 MG PO TABS
10.0000 mg | ORAL_TABLET | Freq: Three times a day (TID) | ORAL | Status: DC
  Administered 2015-02-20 (×2): 10 mg via ORAL
  Filled 2015-02-20 (×2): qty 1

## 2015-02-20 MED ORDER — AMOXICILLIN-POT CLAVULANATE 875-125 MG PO TABS: 1.0000 | ORAL_TABLET | Freq: Two times a day (BID) | ORAL | Status: DC

## 2015-02-20 MED ORDER — GUAIFENESIN-DM 100-10 MG/5ML PO SYRP
5.0000 mL | ORAL_SOLUTION | ORAL | Status: DC | PRN
Start: 2015-02-20 — End: 2015-02-20

## 2015-02-20 MED ORDER — ASPIRIN EC 81 MG PO TBEC: 81.0000 mg | DELAYED_RELEASE_TABLET | Freq: Every evening | ORAL | Status: DC

## 2015-02-20 NOTE — Progress Notes (Signed)
Pt discharged care to [facility]   Transported by [facility]

## 2015-02-20 NOTE — Clinical Social Work Note (Signed)
Clinical Social Work Assessment  Patient Details  Name: Darrell Mcconnell MRN: [id number] Date of Birth: 04-02-1935  Date of referral:  02/20/15               Reason for consult:  Facility Placement                Permission sought to share information with:  Family Supports Permission granted to share information::  Yes, Verbal Permission Granted  Name::     Son, Daryll   Housing/Transportation Living arrangements for the past 2 months:  Assisted Living Facility Source of Information:  Adult Children Patient Interpreter Needed:  None Criminal Activity/Legal Involvement Pertinent to Current Situation/Hospitalization:  No - Comment as needed Significant Relationships:  Adult Children Lives with:  Facility Resident Do you feel safe going back to the place where you live?  Yes Need for family participation in patient care:  Yes (Comment)  Care giving concerns:  Np care giving concerns.   Social Worker assessment / plan:  CSW spoke with pt's son regarding discharge plan. CSW introduced herself and explained role of social work. CSW also explained the process of discharging to SNF. Pt's son is agreeable to dishcarge plan.   CSW spoke with facility. Facility received discharge information and is ready to accept pt. Facility will provide transportation. CSW is signing off as no further needs identified.   Employment status:  Retired Health and safety inspectornsurance information:  [place] [employer] PT Recommendations:  Not assessed at this time Information / Referral to community resources:  Other (Comment Required)  Patient/Family's Response to care:  Pt's son was appreciative of CSW support.  Patient/Family's Understanding of and Emotional Response to Diagnosis, Current Treatment, and Prognosis:  Pt's son understands that pt needs to return to facility.   Emotional Assessment Appearance:  Appears stated age Attitude/Demeanor/Rapport:  Guarded Affect (typically observed):  Agitated Orientation:   Oriented to Self Alcohol / Substance use:  Never Used Psych involvement (Current and /or in the community):  No (Comment)  Discharge Needs  Concerns to be addressed:  No discharge needs identified Readmission within the last 30 days:  No Current discharge risk:  None Barriers to Discharge:  No Barriers Identified   Dede QuerySarah Nakema Fake, LCSW 02/20/2015, 4:48 PM

## 2015-02-20 NOTE — NC FL2 (Addendum)
[place] MEDICAID FL2 LEVEL OF CARE SCREENING TOOL     IDENTIFICATION  Patient Name: Darrell Mcconnell Birthdate: 01-10-1936 Sex: male Admission Date (Current Location): 02/19/2015  [place]   Facility and Address:  [facility], [address], [place], [place] [address]      Provider Number: [id number]  Attending Physician Name and Address:  Adrian SaranSital Mody, MD  Relative Name and Phone Number:       Current Level of Care: Hospital Recommended Level of Care: Family Care Home Prior Approval Number:    Date Approved/Denied:   PASRR Number:    Discharge Plan: Domiciliary (Rest home)    Current Diagnoses: Patient Active Problem List   Diagnosis Date Noted  . Chronic systolic CHF (congestive heart failure) (HCC) 01/04/2015  . GERD (gastroesophageal reflux disease) 01/04/2015  . HTN (hypertension) 01/04/2015  . Anxiety 01/04/2015  . Chronic a-fib (HCC) 01/04/2015  . Dementia 01/04/2015  . COPD (chronic obstructive pulmonary disease) (HCC) 01/04/2015  . BPH (benign prostatic hyperplasia) 01/04/2015  . HCAP (healthcare-associated pneumonia) 07/04/2014  . COPD exacerbation (HCC) 07/04/2014  . Aneurysm of aorta (HCC) 07/04/2014    Orientation RESPIRATION BLADDER Height & Weight    Self  O2 (2L) Continent 5\' 9"  (175.3 cm) 144 lbs.  BEHAVIORAL SYMPTOMS/MOOD NEUROLOGICAL BOWEL NUTRITION STATUS      Continent Diet (Cardiac Diet)  AMBULATORY STATUS COMMUNICATION OF NEEDS Skin   Supervision Verbally Normal                       Personal Care Assistance Level of Assistance  Bathing, Feeding, Dressing Bathing Assistance: Limited assistance Feeding assistance: Limited assistance Dressing Assistance: Limited assistance     Functional Limitations Info  Sight, Hearing, Speech Sight Info: Adequate Hearing Info: Adequate Speech Info: Adequate    SPECIAL CARE FACTORS FREQUENCY                       Contractures       Additional Factors Info  Code Status, Allergies Code Status Info: DNR Allergies Info: No known allergies           Current Medications (02/20/2015):  This is the current hospital active medication list Current Facility-Administered Medications  Medication Dose Route Frequency Provider Last Rate Last Dose  . acetaminophen (TYLENOL) tablet 650 mg  650 mg Oral Q6H PRN Oralia Manisavid Willis, MD   650 mg at 02/20/15 [phone]   Or  . acetaminophen (TYLENOL) suppository 650 mg  650 mg Rectal Q6H PRN Oralia Manisavid Willis, MD      . ALPRAZolam Prudy Feeler(XANAX) tablet 0.25 mg  0.25 mg Oral Q8H PRN Oralia Manisavid Willis, MD   0.25 mg at 02/20/15 0815  . aspirin EC tablet 81 mg  81 mg Oral QPM Oralia Manisavid Willis, MD      . brimonidine [facility](ALPHAGAN) 0.2 % ophthalmic solution 1 drop  1 drop Both Eyes BID Oralia Manisavid Willis, MD   1 drop at 02/20/15 [id number]   And  . timolol (TIMOPTIC) 0.5 % ophthalmic solution 1 drop  1 drop Both Eyes BID Oralia Manisavid Willis, MD   1 drop at 02/20/15 1041  . digoxin (LANOXIN) tablet 0.125 mg  0.125 mg Oral Daily Oralia Manisavid Willis, MD   0.125 mg at 02/20/15 0816  . enoxaparin (LOVENOX) injection 40 mg  40 mg Subcutaneous Q24H Oralia Manisavid Willis, MD      . escitalopram [facility](LEXAPRO) tablet 15 mg  15 mg Oral Daily Oralia Manisavid Willis, MD  15 mg at 02/20/15 0815  . finasteride (PROSCAR) tablet 5 mg  5 mg Oral Daily Oralia Manis, MD   5 mg at 02/20/15 0816  . furosemide (LASIX) tablet 40 mg  40 mg Oral Daily Oralia Manis, MD   40 mg at 02/20/15 0816  . guaiFENesin-dextromethorphan (ROBITUSSIN DM) 100-10 MG/5ML syrup 5 mL  5 mL Oral Q4H PRN Oralia Manis, MD      . ipratropium-albuterol (DUONEB) 0.5-2.5 (3) MG/3ML nebulizer solution 3 mL  3 mL Nebulization Q2H PRN Oralia Manis, MD      . latanoprost (XALATAN) 0.005 % ophthalmic solution 1 drop  1 drop Both Eyes QHS Oralia Manis, MD      . levothyroxine (SYNTHROID, LEVOTHROID) tablet 25 mcg  25 mcg Oral QAC breakfast Oralia Manis, MD      . lisinopril (PRINIVIL,ZESTRIL) tablet 2.5 mg  2.5 mg Oral Daily  Oralia Manis, MD   2.5 mg at 02/20/15 0815  . metoCLOPramide (REGLAN) tablet 10 mg  10 mg Oral TID [facility] Oralia Manis, MD   10 mg at 02/20/15 0815  . metoprolol tartrate (LOPRESSOR) tablet 12.5 mg  12.5 mg Oral Daily Oralia Manis, MD   12.5 mg at 02/20/15 0816  . mometasone-formoterol (DULERA) 100-5 MCG/ACT inhaler 2 puff  2 puff Inhalation BID Oralia Manis, MD   2 puff at 02/20/15 [phone]  . ondansetron (ZOFRAN) tablet 4 mg  4 mg Oral Q6H PRN Oralia Manis, MD       Or  . ondansetron [facility]) injection 4 mg  4 mg Intravenous Q6H PRN Oralia Manis, MD      . pantoprazole (PROTONIX) EC tablet 40 mg  40 mg Oral Daily Oralia Manis, MD   40 mg at 02/20/15 0816  . piperacillin-tazobactam (ZOSYN) IVPB 4.5 g  4.5 g Intravenous 3 times per day Oralia Manis, MD   4.5 g at 02/20/15 0513  . predniSONE (DELTASONE) tablet 60 mg  60 mg Oral Q breakfast Adrian Saran, MD   60 mg at 02/20/15 0820  . sodium chloride 0.9 % injection 3 mL  3 mL Intravenous Q12H Oralia Manis, MD   3 mL at 02/20/15 9604     Discharge Medications: Please see discharge summary for a list of discharge medications.  Current Discharge Medication List    START taking these medications   Details  Doxycycline 100 mg PO BID for 5 days AUGMENTIN 1 tab PO BID     predniSONE (DELTASONE) 10 MG tablet Take 1 tablet (10 mg total) by mouth daily with breakfast. Qty: 20 tablet, Refills: 0      CONTINUE these medications which have NOT CHANGED   Details  albuterol (PROVENTIL HFA;VENTOLIN HFA) 108 (90 Base) MCG/ACT inhaler Inhale 1 puff into the lungs every 6 (six) hours as needed for wheezing or shortness of breath.    ALPRAZolam (XANAX) 0.25 MG tablet Take 0.25 mg by mouth every 8 (eight) hours as needed for anxiety.     aspirin EC 81 MG tablet Take 81 mg by mouth every evening.     bimatoprost (LUMIGAN) 0.01 % SOLN Place 1 drop into both eyes at bedtime.     brimonidine-timolol (COMBIGAN) 0.2-0.5 % ophthalmic  solution Place 1 drop into both eyes 2 (two) times daily.    cholecalciferol (VITAMIN D) 400 UNITS TABS tablet Take 400 Units by mouth daily.    digoxin (LANOXIN) 0.125 MG tablet Take 0.125 mg by mouth daily.    docusate sodium (COLACE) 100 MG capsule Take 100  mg by mouth 2 (two) times daily.    escitalopram (LEXAPRO) 10 MG tablet Take 15 mg by mouth daily.    finasteride (PROSCAR) 5 MG tablet Take 5 mg by mouth daily.    Fluticasone-Salmeterol (ADVAIR) 250-50 MCG/DOSE AEPB Inhale 1 puff into the lungs 2 (two) times daily.    furosemide (LASIX) 40 MG tablet Take 40 mg by mouth daily.    hydrocortisone cream 1 % Apply 1 application topically as needed for itching (and rash).     ipratropium-albuterol (DUONEB) 0.5-2.5 (3) MG/3ML SOLN Take 3 mLs by nebulization 3 (three) times daily as needed (for shortness of breath/wheezing).     levothyroxine (SYNTHROID, LEVOTHROID) 25 MCG tablet Take 25 mcg by mouth daily.     lisinopril (PRINIVIL,ZESTRIL) 2.5 MG tablet Take 2.5 mg by mouth daily.    meloxicam (MOBIC) 7.5 MG tablet Take 7.5 mg by mouth daily.    metoCLOPramide (REGLAN) 10 MG tablet Take 10 mg by mouth 3 (three) times daily before meals.    metoprolol tartrate (LOPRESSOR) 25 MG tablet Take 12.5 mg by mouth daily.     pantoprazole (PROTONIX) 40 MG tablet Take 40 mg by mouth daily.    polyethylene glycol (MIRALAX / GLYCOLAX) packet Take 17 g by mouth daily as needed for moderate constipation.    Prenatal Vit-Fe Fumarate-FA (PREPLUS) 27-1 MG TABS Take 1 tablet by mouth daily.    !! QUEtiapine (SEROQUEL) 25 MG tablet Take 25 mg by mouth 3 (three) times daily.    !! QUEtiapine (SEROQUEL) 50 MG tablet Take 50 mg by mouth at bedtime.    Skin Protectants, Misc. (EUCERIN) cream Apply 1 application topically 2 (two) times daily. Pt applies to arms and legs.    !! - Potential duplicate medications found. Please discuss  with provider.             Relevant Imaging Results:  Relevant Lab Results:   Additional Information    Dede Query, LCSW

## 2015-02-20 NOTE — Progress Notes (Signed)
ANTIBIOTIC CONSULT NOTE - INITIAL  Pharmacy Consult for Zosyn dosing Indication: HCAP  No Known Allergies  Patient Measurements: Height: 5\' 9"  (175.3 cm) Weight: 144 lb (65.318 kg) IBW/kg (Calculated) : 70.7 Adjusted Body Weight: n/a  Vital Signs: Temp: 98 F (36.7 C) (12/28 0243) Temp Source: Oral (12/28 0243) BP: 144/81 mmHg (12/28 0243) Pulse Rate: 77 (12/28 0243) Intake/Output from previous day: 12/27 0701 - 12/28 0700 In: 3 [I.V.:3] Out: 300 [Urine:300] Intake/Output from this shift: Total I/O In: 3 [I.V.:3] Out: 300 [Urine:300]  Labs:  Recent Labs  02/19/15 1833  WBC 9.1  HGB 12.1*  PLT 140*  CREATININE 0.97   Estimated Creatinine Clearance: 58.9 mL/min (by C-G formula based on Cr of 0.97). No results for input(s): VANCOTROUGH, VANCOPEAK, VANCORANDOM, GENTTROUGH, GENTPEAK, GENTRANDOM, TOBRATROUGH, TOBRAPEAK, TOBRARND, AMIKACINPEAK, AMIKACINTROU, AMIKACIN in the last 72 hours.   Microbiology: No results found for this or any previous visit (from the past 720 hour(s)).  Medical History: Past Medical History  Diagnosis Date  . Chronic systolic CHF (congestive heart failure) (HCC)   . COPD (chronic obstructive pulmonary disease) (HCC)   . Aorta aneurysm (HCC)   . Asthma   . Hypertension   . Anxiety   . A-fib (HCC)   . Depression   . Hypothyroidism   . GERD (gastroesophageal reflux disease)   . Dementia   . BPH (benign prostatic hyperplasia)     Medications:   Assessment: Blood and sputum cx pending CXR: R base infiltrate  Goal of Therapy:  Resolution of infection  Plan:  Zosyn 4.5 grams q 8 hours ordered for Pseudomonas risk of abx received during recent admission.  Darrell Mcconnell S 02/20/2015,3:55 AM

## 2015-02-20 NOTE — Progress Notes (Signed)
Pt iis extremely impulsive will not stay in bed voids frequently in small amounts

## 2015-02-20 NOTE — Care Management Obs Status (Signed)
MEDICARE OBSERVATION STATUS NOTIFICATION   Patient Details  Name: Darrell StandardClyde Celona MRN: [id number] Date of Birth: [birth year]   Medicare Observation Status Notification Given:  Yes medicare notice for OBSV/code 44 status given to patient in room 151.    Berna Bueheryl Yeslin Delio, RN 02/20/2015, 8:32 AM

## 2015-02-20 NOTE — Progress Notes (Signed)
Pt. In a-fib. Dr. Anne HahnWillis notified and he said pt. Was in a-fib in ED.

## 2015-02-20 NOTE — Discharge Summary (Addendum)
[facility] [place] [facility]   PATIENT NAME: Darrell Mcconnell    MR#:  [id number]  DATE OF BIRTH:  12-Apr-1935  DATE OF ADMISSION:  02/19/2015 ADMITTING PHYSICIAN: Oralia Manis, MD  DATE OF DISCHARGE: 02/20/2016  PRIMARY CARE PHYSICIAN: Lyndon Code, MD    ADMISSION DIAGNOSIS:  Community acquired pneumonia [J18.9] COPD with acute exacerbation (HCC) [J44.1]  DISCHARGE DIAGNOSIS:  Principal Problem:   HCAP (healthcare-associated pneumonia) Active Problems:   COPD exacerbation (HCC)   Chronic systolic CHF (congestive heart failure) (HCC)   GERD (gastroesophageal reflux disease)   HTN (hypertension)   Anxiety   Chronic a-fib (HCC)   Dementia   BPH (benign prostatic hyperplasia)   SECONDARY DIAGNOSIS:   Past Medical History  Diagnosis Date  . Chronic systolic CHF (congestive heart failure) (HCC)   . COPD (chronic obstructive pulmonary disease) (HCC)   . Aorta aneurysm (HCC)   . Asthma   . Hypertension   . Anxiety   . A-fib (HCC)   . Depression   . Hypothyroidism   . GERD (gastroesophageal reflux disease)   . Dementia   . BPH (benign prostatic hyperplasia)     HOSPITAL COURSE:   79 y.o. male with a history of COPD who uses 2 L of oxygen at baseline who presents from his nursing facility by EMS for evaluation of shortness of breath and found to have a pneumonia. For details, refer to H and P.  1. HCAP: Patient has a chest XRAY that shows right lung base infiltrate. He was started on ZOSYN. He did well with this regimen and was afebrile at discarge. His cough and shortness of breath improved.  2. COPD acute exacerbation: Patient has improved and will continue on outpatient O2 and steroid taper.  3. Essential HTN: Continue Metoprolol.  4. Chronic A fib: Continue digoxin, metoprolol, asa  5. Anxiety: continue home meds.  6. BPH: Continue home medications 7. PROLONGED QTC: He needs closed follow up and a repeat EKG, some of his [facility]  MEDs may need to be stopped in the future.    DISCHARGE CONDITIONS AND DIET:   Stable condition Cardiac diet  CONSULTS OBTAINED:     DRUG ALLERGIES:  No Known Allergies  DISCHARGE MEDICATIONS:   Current Discharge Medication List    START taking these medications   Details  Doxycycline 100 mg PO BID for 5 days AUGMENTIN 1 tab PO BID     predniSONE (DELTASONE) 10 MG tablet Take 1 tablet (10 mg total) by mouth daily with breakfast. Qty: 20 tablet, Refills: 0      CONTINUE these medications which have NOT CHANGED   Details  albuterol (PROVENTIL HFA;VENTOLIN HFA) 108 (90 Base) MCG/ACT inhaler Inhale 1 puff into the lungs every 6 (six) hours as needed for wheezing or shortness of breath.    ALPRAZolam (XANAX) 0.25 MG tablet Take 0.25 mg by mouth every 8 (eight) hours as needed for anxiety.     aspirin EC 81 MG tablet Take 81 mg by mouth every evening.     bimatoprost (LUMIGAN) 0.01 % SOLN Place 1 drop into both eyes at bedtime.     brimonidine-timolol (COMBIGAN) 0.2-0.5 % ophthalmic solution Place 1 drop into both eyes 2 (two) times daily.    cholecalciferol (VITAMIN D) 400 UNITS TABS tablet Take 400 Units by mouth daily.    digoxin (LANOXIN) 0.125 MG tablet Take 0.125 mg by mouth daily.    docusate sodium (COLACE) 100 MG capsule Take 100  mg by mouth 2 (two) times daily.    escitalopram (LEXAPRO) 10 MG tablet Take 15 mg by mouth daily.    finasteride (PROSCAR) 5 MG tablet Take 5 mg by mouth daily.    Fluticasone-Salmeterol (ADVAIR) 250-50 MCG/DOSE AEPB Inhale 1 puff into the lungs 2 (two) times daily.    furosemide (LASIX) 40 MG tablet Take 40 mg by mouth daily.    hydrocortisone cream 1 % Apply 1 application topically as needed for itching (and rash).     ipratropium-albuterol (DUONEB) 0.5-2.5 (3) MG/3ML SOLN Take 3 mLs by nebulization 3 (three) times daily as needed (for shortness of breath/wheezing).     levothyroxine (SYNTHROID, LEVOTHROID) 25 MCG tablet Take  25 mcg by mouth daily.     lisinopril (PRINIVIL,ZESTRIL) 2.5 MG tablet Take 2.5 mg by mouth daily.    meloxicam (MOBIC) 7.5 MG tablet Take 7.5 mg by mouth daily.    metoCLOPramide (REGLAN) 10 MG tablet Take 10 mg by mouth 3 (three) times daily before meals.    metoprolol tartrate (LOPRESSOR) 25 MG tablet Take 12.5 mg by mouth daily.     pantoprazole (PROTONIX) 40 MG tablet Take 40 mg by mouth daily.    polyethylene glycol (MIRALAX / GLYCOLAX) packet Take 17 g by mouth daily as needed for moderate constipation.    Prenatal Vit-Fe Fumarate-FA (PREPLUS) 27-1 MG TABS Take 1 tablet by mouth daily.    !! QUEtiapine (SEROQUEL) 25 MG tablet Take 25 mg by mouth 3 (three) times daily.    !! QUEtiapine (SEROQUEL) 50 MG tablet Take 50 mg by mouth at bedtime.    Skin Protectants, Misc. (EUCERIN) cream Apply 1 application topically 2 (two) times daily. Pt applies to arms and legs.     !! - Potential duplicate medications found. Please discuss with provider.            Today   CHIEF COMPLAINT:  Doing well no SOB or wheezing much improved since yesterday   VITAL SIGNS:  Blood pressure 139/76, pulse 75, temperature 98 F (36.7 C), temperature source Oral, resp. rate 16, height 5\' 9"  (1.753 m), weight 65.318 kg (144 lb), SpO2 98 %.   REVIEW OF SYSTEMS:  Review of Systems  Constitutional: Negative for fever, chills and malaise/fatigue.  HENT: Negative for sore throat.   Eyes: Negative for blurred vision.  Respiratory: Negative for cough, hemoptysis, shortness of breath and wheezing.   Cardiovascular: Negative for chest pain, palpitations and leg swelling.  Gastrointestinal: Negative for nausea, vomiting, abdominal pain, diarrhea and blood in stool.  Genitourinary: Negative for dysuria.  Musculoskeletal: Negative for back pain.  Neurological: Negative for dizziness, tremors and headaches.  Endo/Heme/Allergies: Does not bruise/bleed easily.     PHYSICAL EXAMINATION:  GENERAL:   79 y.o.-year-old patient lying in the bed with no acute distress.  NECK:  Supple, no jugular venous distention. No thyroid enlargement, no tenderness.  LUNGS: Normal breath sounds bilaterally, no wheezing, rales,rhonchi  No use of accessory muscles of respiration.  CARDIOVASCULAR: irr, irr  2/6 SEM NO rubs, or gallops.  ABDOMEN: Soft, non-tender, non-distended. Bowel sounds present. No organomegaly or mass.  EXTREMITIES: No pedal edema, cyanosis, or clubbing.  PSYCHIATRIC: The patient is alert and oriented to name.  SKIN: No obvious rash, lesion, or ulcer.   DATA REVIEW:   CBC  Recent Labs Lab 02/20/15 0506  WBC 7.0  HGB 11.3*  HCT 33.8*  PLT 134*    Chemistries   Recent Labs Lab 02/19/15 1833 02/20/15 0506  NA 141  --   K 3.5  --   CL 103  --   CO2 28  --   GLUCOSE 101*  --   BUN 18  --   CREATININE 0.97 1.03  CALCIUM 9.2  --   AST 21  --   ALT 13*  --   ALKPHOS 81  --   BILITOT 0.5  --     Cardiac Enzymes  Recent Labs Lab 02/19/15 1833 02/19/15 2152  TROPONINI 0.04* <0.03    Microbiology Results  @  RADIOLOGY:  Dg Chest 2 View  02/19/2015  CLINICAL DATA:  Increasing shortness of breath over 2 days EXAM: CHEST - 2 VIEW COMPARISON:  01/23/2015 FINDINGS: Cardiac shadow is not mildly enlarged. Postsurgical changes are again seen. Increasing density is noted in the right lung base consistent with some evolving infiltrate. No other focal abnormality is noted. IMPRESSION: Evolving infiltrate in the right base. Electronically Signed   By: Alcide Clever M.D.   On: 02/19/2015 20:00      Management plans discussed with the patient and he is in agreement. Stable for discharge   Patient should follow up with PCP in 1 week  CODE STATUS:     Code Status Orders        Start     Ordered   02/20/15 0218  Do not attempt resuscitation (DNR)   Continuous    Question Answer Comment  In the event of cardiac or respiratory ARREST Do not call a "code  blue"   In the event of cardiac or respiratory ARREST Do not perform Intubation, CPR, defibrillation or ACLS   In the event of cardiac or respiratory ARREST Use medication by any route, position, wound care, and other measures to relive pain and suffering. May use oxygen, suction and manual treatment of airway obstruction as needed for comfort.      02/20/15 0217    Advance Directive Documentation        Most Recent Value   Type of Advance Directive  Out of facility DNR (pink MOST or yellow form)   Pre-existing out of facility DNR order (yellow form or pink MOST form)     "MOST" Form in Place?        TOTAL TIME TAKING CARE OF THIS PATIENT: 35 minutes.    Note: This dictation was prepared with Dragon dictation along with smaller phrase technology. Any transcriptional errors that result from this process are unintentional.  Satine Hausner M.D on 02/20/2015 at 8:15 AM  Between 7am to 6pm - Pager - [phone] After 6pm go to [email or web address] - password [email or web address] [facility]  [place] [facility]  Office  [phone]  CC: Primary care physician; Lyndon Code, MD

## 2015-02-24 LAB — CULTURE, BLOOD (ROUTINE X 2)
Culture: NO GROWTH
Culture: NO GROWTH

## 2015-02-28 DIAGNOSIS — I10 Essential (primary) hypertension: Secondary | ICD-10-CM | POA: Diagnosis not present

## 2015-02-28 DIAGNOSIS — Z79899 Other long term (current) drug therapy: Secondary | ICD-10-CM | POA: Diagnosis not present

## 2015-02-28 DIAGNOSIS — E785 Hyperlipidemia, unspecified: Secondary | ICD-10-CM | POA: Diagnosis not present

## 2015-02-28 DIAGNOSIS — Z125 Encounter for screening for malignant neoplasm of prostate: Secondary | ICD-10-CM | POA: Diagnosis not present

## 2015-02-28 DIAGNOSIS — J9611 Chronic respiratory failure with hypoxia: Secondary | ICD-10-CM | POA: Diagnosis not present

## 2015-02-28 DIAGNOSIS — E039 Hypothyroidism, unspecified: Secondary | ICD-10-CM | POA: Diagnosis not present

## 2015-02-28 DIAGNOSIS — I499 Cardiac arrhythmia, unspecified: Secondary | ICD-10-CM | POA: Diagnosis not present

## 2015-03-06 DIAGNOSIS — J449 Chronic obstructive pulmonary disease, unspecified: Secondary | ICD-10-CM | POA: Diagnosis not present

## 2015-03-26 DIAGNOSIS — J90 Pleural effusion, not elsewhere classified: Secondary | ICD-10-CM | POA: Diagnosis not present

## 2015-03-26 DIAGNOSIS — J449 Chronic obstructive pulmonary disease, unspecified: Secondary | ICD-10-CM | POA: Diagnosis not present

## 2015-03-26 DIAGNOSIS — Z9981 Dependence on supplemental oxygen: Secondary | ICD-10-CM | POA: Diagnosis not present

## 2015-04-06 DIAGNOSIS — J449 Chronic obstructive pulmonary disease, unspecified: Secondary | ICD-10-CM | POA: Diagnosis not present

## 2015-04-26 ENCOUNTER — Encounter: Payer: Self-pay | Admitting: *Deleted

## 2015-04-26 ENCOUNTER — Other Ambulatory Visit: Payer: Self-pay | Admitting: *Deleted

## 2015-04-26 NOTE — Patient Outreach (Addendum)
[facility] Care Management  04/26/2015  Revonda StandardClyde Mcconnell 1935/10/16 [id number]  Subjective: Telephone call to patient's [facility] number [facility], [facility] living [facility]) , spoke with male answering the phone, states patient is currently unavailable and requested call back. Telephone call to patient's home number, spoke with patient, and HIPAA verified.  Patient states [facility], does not know the address, not allowed to speak with anyone, and advised RNCM to speak with Darrell Mcconnell regarding his healthcare. Telephone call to patient's emergency contact Darrell Mcconnell([name]), spoke with Darrell Mcconnell, states Ms. Darrell NovemberMcCormick is not currently available, left HIPAA compliant message with Ms. Darrell BarreJones for Ms. Mcconnell and requested call back.  Telephone call from patient's emergency contact Darrell Mcconnell, states she is returning call, and verified HIPAA. Discussed [facility] services and Ms. Mcconnell declined services on patient's behalf.    Ms. Darrell NovemberMcCormick states [facility].   States Hospice stopped as of 10/03/14.   Ms. Darrell NovemberMcCormick states she pays some out of pocket expense for patient's medications every month and declined referral [facility].   Ms. Darrell NovemberMcCormick declined information on how to obtain more information on patient's [employer] benefits.  Ms. Darrell NovemberMcCormick states patient does not have any needs at this time, thanked [facility] for calling and she disconnected the call.   Objective: Per Epic case review: Patient's caregiver had declined [facility] services on patient's behalf on 12/10/14.  Patient has a history of COPD and hypertension.  Current insurance payers: [facility] [address], [employer], [employer], and [place].  Patient hospitalized on 02/19/15 - 02/20/15 for pneumonia and COPD exacerbation.   Assessment: [employer] COPD  high risk list referral sent on 2/171/17.    Patient's emergency contact declined [facility] services on patient's behalf.  Plan: RNCM will send patient successful outreach letter. RNCM will send patient's primary MD case closure letter due to patient's emergency contact refusal of services on patient's behalf. RNCM will send case closure request due to patient's emergency contact refusal of services on patient's behalf to Darrell [facility]cc[facility].     Darrell Mcconnell H. Gardiner Barefootooper RN, BSN, CCM [facility] Care [facility] Telephonic CM Phone: [phone] Fax: [phone]

## 2015-05-04 DIAGNOSIS — J449 Chronic obstructive pulmonary disease, unspecified: Secondary | ICD-10-CM | POA: Diagnosis not present

## 2015-05-16 ENCOUNTER — Emergency Department
Admission: EM | Admit: 2015-05-16 | Discharge: 2015-05-16 | Disposition: A | Discharge status: Home / Self Care | Payer: Medicare Other | Attending: Emergency Medicine | Admitting: Emergency Medicine

## 2015-05-16 ENCOUNTER — Emergency Department: Payer: Medicare Other

## 2015-05-16 ENCOUNTER — Encounter: Payer: Self-pay | Admitting: Emergency Medicine

## 2015-05-16 DIAGNOSIS — Z7952 Long term (current) use of systemic steroids: Secondary | ICD-10-CM | POA: Diagnosis not present

## 2015-05-16 DIAGNOSIS — Z7951 Long term (current) use of inhaled steroids: Secondary | ICD-10-CM | POA: Insufficient documentation

## 2015-05-16 DIAGNOSIS — F0390 Unspecified dementia without behavioral disturbance: Secondary | ICD-10-CM | POA: Diagnosis not present

## 2015-05-16 DIAGNOSIS — Z791 Long term (current) use of non-steroidal anti-inflammatories (NSAID): Secondary | ICD-10-CM | POA: Diagnosis not present

## 2015-05-16 DIAGNOSIS — F419 Anxiety disorder, unspecified: Secondary | ICD-10-CM | POA: Insufficient documentation

## 2015-05-16 DIAGNOSIS — Z7982 Long term (current) use of aspirin: Secondary | ICD-10-CM | POA: Diagnosis not present

## 2015-05-16 DIAGNOSIS — R1032 Left lower quadrant pain: Secondary | ICD-10-CM | POA: Diagnosis not present

## 2015-05-16 DIAGNOSIS — I10 Essential (primary) hypertension: Secondary | ICD-10-CM | POA: Insufficient documentation

## 2015-05-16 DIAGNOSIS — K4090 Unilateral inguinal hernia, without obstruction or gangrene, not specified as recurrent: Secondary | ICD-10-CM | POA: Diagnosis not present

## 2015-05-16 DIAGNOSIS — Z79899 Other long term (current) drug therapy: Secondary | ICD-10-CM | POA: Insufficient documentation

## 2015-05-16 DIAGNOSIS — I719 Aortic aneurysm of unspecified site, without rupture: Secondary | ICD-10-CM | POA: Diagnosis not present

## 2015-05-16 DIAGNOSIS — Z792 Long term (current) use of antibiotics: Secondary | ICD-10-CM | POA: Insufficient documentation

## 2015-05-16 DIAGNOSIS — F329 Major depressive disorder, single episode, unspecified: Secondary | ICD-10-CM | POA: Diagnosis not present

## 2015-05-16 DIAGNOSIS — R300 Dysuria: Secondary | ICD-10-CM | POA: Diagnosis not present

## 2015-05-16 DIAGNOSIS — R350 Frequency of micturition: Secondary | ICD-10-CM | POA: Diagnosis present

## 2015-05-16 DIAGNOSIS — Z87891 Personal history of nicotine dependence: Secondary | ICD-10-CM | POA: Diagnosis not present

## 2015-05-16 LAB — URINALYSIS COMPLETE WITH MICROSCOPIC (ARMC ONLY)
BILIRUBIN URINE: NEGATIVE
Bacteria, UA: NONE SEEN
GLUCOSE, UA: NEGATIVE mg/dL
HGB URINE DIPSTICK: NEGATIVE
Ketones, ur: NEGATIVE mg/dL
Leukocytes, UA: NEGATIVE
Nitrite: NEGATIVE
Protein, ur: NEGATIVE mg/dL
Specific Gravity, Urine: 1.009 (ref 1.005–1.030)
pH: 5 (ref 5.0–8.0)

## 2015-05-16 LAB — CBC WITH DIFFERENTIAL/PLATELET
Basophils Absolute: 0.1 10*3/uL (ref 0–0.1)
Basophils Relative: 1 %
Eosinophils Absolute: 0.5 10*3/uL (ref 0–0.7)
Eosinophils Relative: 6 %
HEMATOCRIT: 37.1 % — AB (ref 40.0–52.0)
HEMOGLOBIN: 12.1 g/dL — AB (ref 13.0–18.0)
LYMPHS ABS: 1.2 10*3/uL (ref 1.0–3.6)
LYMPHS PCT: 13 %
MCH: 27.2 pg (ref 26.0–34.0)
MCHC: 32.7 g/dL (ref 32.0–36.0)
MCV: 83.4 fL (ref 80.0–100.0)
MONOS PCT: 7 %
Monocytes Absolute: 0.6 10*3/uL (ref 0.2–1.0)
NEUTROS ABS: 6.7 10*3/uL — AB (ref 1.4–6.5)
NEUTROS PCT: 73 %
Platelets: 198 10*3/uL (ref 150–440)
RBC: 4.45 MIL/uL (ref 4.40–5.90)
RDW: 16.8 % — ABNORMAL HIGH (ref 11.5–14.5)
WBC: 9 10*3/uL (ref 3.8–10.6)

## 2015-05-16 LAB — BASIC METABOLIC PANEL
ANION GAP: 6 (ref 5–15)
BUN: 16 mg/dL (ref 6–20)
CHLORIDE: 102 mmol/L (ref 101–111)
CO2: 31 mmol/L (ref 22–32)
CREATININE: 0.87 mg/dL (ref 0.61–1.24)
Calcium: 9 mg/dL (ref 8.9–10.3)
GFR calc non Af Amer: >60 mL/min (ref >60)
Glucose, Bld: 83 mg/dL (ref 65–99)
POTASSIUM: 3.5 mmol/L (ref 3.5–5.1)
Sodium: 139 mmol/L (ref 135–145)

## 2015-05-16 MED ORDER — IOHEXOL 240 MG/ML SOLN
25.0000 mL | Freq: Once | INTRAMUSCULAR | Status: AC | PRN
  Administered 2015-05-16: 25 mL via ORAL
  Filled 2015-05-16: qty 25

## 2015-05-16 MED ORDER — HYDROMORPHONE HCL 1 MG/ML IJ SOLN
0.5000 mg | Freq: Once | INTRAMUSCULAR | Status: AC
  Administered 2015-05-16: 0.5 mg via INTRAMUSCULAR

## 2015-05-16 MED ORDER — HYDROMORPHONE HCL 1 MG/ML IJ SOLN
INTRAMUSCULAR | Status: AC
  Administered 2015-05-16: 0.5 mg via INTRAMUSCULAR
  Filled 2015-05-16: qty 1

## 2015-05-16 MED ORDER — IOHEXOL 240 MG/ML SOLN: 25.0000 mL | Freq: Once | INTRAMUSCULAR | Status: DC | PRN

## 2015-05-16 MED ORDER — IOPAMIDOL (ISOVUE-300) INJECTION 61%
100.0000 mL | Freq: Once | INTRAVENOUS | Status: AC | PRN
  Administered 2015-05-16: 100 mL via INTRAVENOUS
  Filled 2015-05-16: qty 100

## 2015-05-16 NOTE — ED Notes (Signed)
See triage . States area is burning to groin area  When standing possible hernia noted   No fever

## 2015-05-16 NOTE — ED Provider Notes (Signed)
[facility] Emergency Department Provider Note  ____________________________________________  Time seen: Approximately 12:16 PM  I have reviewed the triage vital signs and the nursing notes.   HISTORY  Chief Complaint Urinary Frequency    HPI Darrell Mcconnell is a 80 y.o. male patient [facility] with complaint of burning with urination for one day. Upon questioning the patient he is not concerned about dysuria since concerned about an inguinal mass  for 2 days. Patient rates pain as a 6/10. Patient described a pain as burning. No palliative measures for this complaint. Past Medical History  Diagnosis Date  . Chronic systolic CHF (congestive heart failure) (HCC)   . COPD (chronic obstructive pulmonary disease) (HCC)   . Aorta aneurysm (HCC)   . Asthma   . Hypertension   . Anxiety   . A-fib (HCC)   . Depression   . Hypothyroidism   . GERD (gastroesophageal reflux disease)   . Dementia   . BPH (benign prostatic hyperplasia)     Patient Active Problem List   Diagnosis Date Noted  . Chronic systolic CHF (congestive heart failure) (HCC) 01/04/2015  . GERD (gastroesophageal reflux disease) 01/04/2015  . HTN (hypertension) 01/04/2015  . Anxiety 01/04/2015  . Chronic a-fib (HCC) 01/04/2015  . Dementia 01/04/2015  . COPD (chronic obstructive pulmonary disease) (HCC) 01/04/2015  . BPH (benign prostatic hyperplasia) 01/04/2015  . HCAP (healthcare-associated pneumonia) 07/04/2014  . COPD exacerbation (HCC) 07/04/2014  . Aneurysm of aorta (HCC) 07/04/2014    Past Surgical History  Procedure Laterality Date  . Coronary artery bypass graft    . Prostate surgery    . Abdominal aortic aneurysm repair      Current Outpatient Rx  Name  Route  Sig  Dispense  Refill  . albuterol (PROVENTIL HFA;VENTOLIN HFA) 108 (90 Base) MCG/ACT inhaler   Inhalation   Inhale 1 puff into the lungs every 6 (six) hours as needed for wheezing  or shortness of breath.         . ALPRAZolam (XANAX) 0.25 MG tablet   Oral   Take 0.25 mg by mouth every 8 (eight) hours as needed for anxiety.          Marland Kitchen amoxicillin-clavulanate (AUGMENTIN) 875-125 MG tablet   Oral   Take 1 tablet by mouth 2 (two) times daily.   10 tablet   0   . aspirin EC 81 MG tablet   Oral   Take 81 mg by mouth every evening.          . bimatoprost (LUMIGAN) 0.01 % SOLN   Both Eyes   Place 1 drop into both eyes at bedtime.          . brimonidine-timolol (COMBIGAN) 0.2-0.5 % ophthalmic solution   Both Eyes   Place 1 drop into both eyes 2 (two) times daily.         . cholecalciferol (VITAMIN D) 400 UNITS TABS tablet   Oral   Take 400 Units by mouth daily.         . digoxin (LANOXIN) 0.125 MG tablet   Oral   Take 0.125 mg by mouth daily.         Marland Kitchen docusate sodium (COLACE) 100 MG capsule   Oral   Take 100 mg by mouth 2 (two) times daily.         Marland Kitchen doxycycline (VIBRAMYCIN) 50 MG capsule   Oral   Take 2 capsules (100 mg total) by mouth  2 (two) times daily.   20 capsule   0   . escitalopram (LEXAPRO) 10 MG tablet   Oral   Take 15 mg by mouth daily.         . finasteride (PROSCAR) 5 MG tablet   Oral   Take 5 mg by mouth daily.         . Fluticasone-Salmeterol (ADVAIR) 250-50 MCG/DOSE AEPB   Inhalation   Inhale 1 puff into the lungs 2 (two) times daily.         . furosemide (LASIX) 40 MG tablet   Oral   Take 40 mg by mouth daily.         . hydrocortisone cream 1 %   Topical   Apply 1 application topically as needed for itching (and rash).          Marland Kitchen ipratropium-albuterol (DUONEB) 0.5-2.5 (3) MG/3ML SOLN   Nebulization   Take 3 mLs by nebulization 3 (three) times daily as needed (for shortness of breath/wheezing).          Marland Kitchen levothyroxine (SYNTHROID, LEVOTHROID) 25 MCG tablet   Oral   Take 25 mcg by mouth daily.          Marland Kitchen lisinopril (PRINIVIL,ZESTRIL) 2.5 MG tablet   Oral   Take 2.5 mg by mouth daily.          . meloxicam (MOBIC) 7.5 MG tablet   Oral   Take 7.5 mg by mouth daily.         . metoCLOPramide (REGLAN) 10 MG tablet   Oral   Take 10 mg by mouth 3 (three) times daily before meals.         . metoprolol tartrate (LOPRESSOR) 25 MG tablet   Oral   Take 12.5 mg by mouth daily.          . pantoprazole (PROTONIX) 40 MG tablet   Oral   Take 40 mg by mouth daily.         . polyethylene glycol (MIRALAX / GLYCOLAX) packet   Oral   Take 17 g by mouth daily as needed for moderate constipation.         . predniSONE (DELTASONE) 10 MG tablet   Oral   Take 1 tablet (10 mg total) by mouth daily with breakfast.   20 tablet   0     Label  & dispense according to the schedule below: ...   . Prenatal Vit-Fe Fumarate-FA (PREPLUS) 27-1 MG TABS   Oral   Take 1 tablet by mouth daily.         . QUEtiapine (SEROQUEL) 25 MG tablet   Oral   Take 25 mg by mouth 3 (three) times daily.         . QUEtiapine (SEROQUEL) 50 MG tablet   Oral   Take 50 mg by mouth at bedtime.         . Skin Protectants, Misc. (EUCERIN) cream   Topical   Apply 1 application topically 2 (two) times daily. Pt applies to arms and legs.           Allergies Review of patient's allergies indicates no known allergies.  Family History  Problem Relation Age of Onset  . CAD Father     Social History Social History  Substance Use Topics  . Smoking status: Former Games developer  . Smokeless tobacco: None  . Alcohol Use: No    Review of Systems Constitutional: No fever/chills Eyes: No visual changes. ENT: No sore  throat. Cardiovascular: Denies chest pain. Respiratory: Denies shortness of breath. Gastrointestinal: No abdominal pain.  No nausea, no vomiting.  No diarrhea.  No constipation. Genitourinary: Negative for dysuria. Swelling in the groin area Musculoskeletal: Negative for back pain. Skin: Negative for rash. Neurological: Negative for headaches, focal weakness or  numbness. Psychiatric: Depression and anxiety. Dementia Endocrine: Hypertension ____________________________________________   PHYSICAL EXAM:  VITAL SIGNS: ED Triage Vitals  Enc Vitals Group     BP 05/16/15 1146 125/82 mmHg     Pulse Rate 05/16/15 1146 120     Resp 05/16/15 1146 20     Temp 05/16/15 1146 97.4 F (36.3 C)     Temp src --      SpO2 05/16/15 1146 98 %     Weight 05/16/15 1128 150 lb (68.04 kg)     Height 05/16/15 1128 5\' 9"  (1.753 m)     Head Cir --      Peak Flow --      Pain Score 05/16/15 1129 6     Pain Loc --      Pain Edu? --      Excl. in GC? --     Constitutional: Alert and oriented. Well appearing and in no acute distress. Eyes: Conjunctivae are normal. PERRL. EOMI. Head: Atraumatic. Nose: No congestion/rhinnorhea. Mouth/Throat: Mucous membranes are moist.  Oropharynx non-erythematous. Neck: No stridor.  No cervical spine tenderness to palpation. Hematological/Lymphatic/Immunilogical: No cervical lymphadenopathy. Cardiovascular: Normal rate, regular rhythm. Grossly normal heart sounds.  Good peripheral circulation. Respiratory: Normal respiratory effort.  No retractions. Lungs CTAB. Gastrointestinal: Soft and nontender. No distention. No abdominal bruits. No CVA tenderness. Left nonreducible inguinal mass Musculoskeletal: No lower extremity tenderness nor edema.  No joint effusions. Neurologic:  Normal speech and language. No gross focal neurologic deficits are appreciated. No gait instability. Skin:  Skin is warm, dry and intact. No rash noted. Psychiatric: Mood and affect are normal. Speech and behavior are normal.  ____________________________________________   LABS (all labs ordered are listed, but only abnormal results are displayed)  Labs Reviewed  URINALYSIS COMPLETEWITH MICROSCOPIC ([facility] ONLY) - Abnormal; Notable for the following:    Color, Urine YELLOW (*)    APPearance CLEAR (*)    Squamous Epithelial / LPF 0-5 (*)    All other  components within normal limits  CBC WITH DIFFERENTIAL/PLATELET - Abnormal; Notable for the following:    Hemoglobin 12.1 (*)    HCT 37.1 (*)    RDW 16.8 (*)    Neutro Abs 6.7 (*)    All other components within normal limits  BASIC METABOLIC PANEL   ____________________________________________  EKG   ____________________________________________  RADIOLOGY CT of abdomen and pelvis revealed an enlarging secular aneurysm of the infrarenal abdominal aorta small left inguinal scrotal canal hernia.  ____________________________________________   PROCEDURES  Procedure(s) performed: None  Critical Care performed: No  ____________________________________________   INITIAL IMPRESSION / ASSESSMENT AND PLAN / ED COURSE  Pertinent labs & imaging results that were available during my care of the patient were reviewed by me and considered in my medical decision making (see chart for details). Discussed findings with patient. Patient is advised to call the vascular surgeon and general surgeon tomorrow to schedule follow-up appointments for these findings. ____________________________________________   FINAL CLINICAL IMPRESSION(S) / ED DIAGNOSES  Final diagnoses:  Aortic aneurysm without rupture, unspecified portion of aorta (HCC)  Left inguinal hernia      Joni Reiningonald K Kiani Wurtzel, PA-C 05/16/15 1645

## 2015-05-16 NOTE — Discharge Instructions (Signed)
Inguinal Hernia, Adult , Care After °Refer to this sheet in the next few weeks. These discharge instructions provide you with general information on caring for yourself after you leave the hospital. Your caregiver may also give you specific instructions. Your treatment has been planned according to the most current medical practices available, but unavoidable complications sometimes occur. If you have any problems or questions after discharge, please call your caregiver. °HOME CARE INSTRUCTIONS °· Put ice on the operative site. °¨ Put ice in a plastic bag. °¨ Place a towel between your skin and the bag. °¨ Leave the ice on for 15-20 minutes at a time, 03-04 times a day while awake. °· Change bandages (dressings) as directed. °· Keep the wound dry and clean. The wound may be washed gently with soap and water. Gently blot or dab the wound dry. It is okay to take showers 24 to 48 hours after surgery. Do not take baths, use swimming pools, or use hot tubs for 10 days, or as directed by your caregiver. °· Only take over-the-counter or prescription medicines for pain, discomfort, or fever as directed by your caregiver. °· Continue your normal diet as directed. °· Do not lift anything more than 10 pounds or play contact sports for 3 weeks, or as directed. °SEEK MEDICAL CARE IF: °· There is redness, swelling, or increasing pain in the wound. °· There is fluid (pus) coming from the wound. °· There is drainage from a wound lasting longer than 1 day. °· You have an oral temperature above 102° F (38.9° C). °· You notice a bad smell coming from the wound or dressing. °· The wound breaks open after the stitches (sutures) have been removed. °· You notice increasing pain in the shoulders (shoulder strap areas). °· You develop dizzy episodes or fainting while standing. °· You feel sick to your stomach (nauseous) or throw up (vomit). °SEEK IMMEDIATE MEDICAL CARE IF: °· You develop a rash. °· You have difficulty breathing. °· You  develop a reaction or have side effects to medicines you were given. °MAKE SURE YOU:  °· Understand these instructions. °· Will watch your condition. °· Will get help right away if you are not doing well or get worse. °  °This information is not intended to replace advice given to you by your health care provider. Make sure you discuss any questions you have with your health care provider. °  °Document Released: 03/12/2006 Document Revised: 03/02/2014 Document Reviewed: 08/13/2014 °Elsevier Interactive Patient Education ©2016 [employer] ° °

## 2015-05-16 NOTE — ED Notes (Signed)
[facility] with [facility]   They will come and pick pt up in about 30 mins  Pt is aware

## 2015-05-16 NOTE — ED Notes (Signed)
[facility]  staes he developed some burning with urination since yesterday

## 2015-05-21 DIAGNOSIS — J449 Chronic obstructive pulmonary disease, unspecified: Secondary | ICD-10-CM | POA: Diagnosis not present

## 2015-05-21 DIAGNOSIS — I716 Thoracoabdominal aortic aneurysm, without rupture: Secondary | ICD-10-CM | POA: Diagnosis not present

## 2015-05-29 NOTE — ED Provider Notes (Signed)
1:43 PM 05/29/2015 I examined and evaluated this patient along with the PA and agree with treatment and plan.  Leona CarryLinda M Elisabet Gutzmer, MD 05/29/15 [phone]

## 2015-06-03 ENCOUNTER — Observation Stay
Admission: EM | Admit: 2015-06-03 | Discharge: 2015-06-04 | Payer: Medicare Other | Attending: Internal Medicine | Admitting: Internal Medicine

## 2015-06-03 ENCOUNTER — Encounter: Payer: Self-pay | Admitting: Emergency Medicine

## 2015-06-03 ENCOUNTER — Emergency Department: Payer: Medicare Other

## 2015-06-03 DIAGNOSIS — H548 Legal blindness, as defined in USA: Secondary | ICD-10-CM | POA: Insufficient documentation

## 2015-06-03 DIAGNOSIS — Z9889 Other specified postprocedural states: Secondary | ICD-10-CM | POA: Insufficient documentation

## 2015-06-03 DIAGNOSIS — Z8249 Family history of ischemic heart disease and other diseases of the circulatory system: Secondary | ICD-10-CM | POA: Insufficient documentation

## 2015-06-03 DIAGNOSIS — N400 Enlarged prostate without lower urinary tract symptoms: Secondary | ICD-10-CM | POA: Diagnosis not present

## 2015-06-03 DIAGNOSIS — F0390 Unspecified dementia without behavioral disturbance: Secondary | ICD-10-CM | POA: Diagnosis not present

## 2015-06-03 DIAGNOSIS — Z8679 Personal history of other diseases of the circulatory system: Secondary | ICD-10-CM | POA: Diagnosis not present

## 2015-06-03 DIAGNOSIS — Z7982 Long term (current) use of aspirin: Secondary | ICD-10-CM | POA: Insufficient documentation

## 2015-06-03 DIAGNOSIS — K219 Gastro-esophageal reflux disease without esophagitis: Secondary | ICD-10-CM | POA: Insufficient documentation

## 2015-06-03 DIAGNOSIS — I482 Chronic atrial fibrillation: Secondary | ICD-10-CM | POA: Insufficient documentation

## 2015-06-03 DIAGNOSIS — I5022 Chronic systolic (congestive) heart failure: Secondary | ICD-10-CM | POA: Diagnosis not present

## 2015-06-03 DIAGNOSIS — R918 Other nonspecific abnormal finding of lung field: Secondary | ICD-10-CM | POA: Insufficient documentation

## 2015-06-03 DIAGNOSIS — J45909 Unspecified asthma, uncomplicated: Secondary | ICD-10-CM | POA: Insufficient documentation

## 2015-06-03 DIAGNOSIS — Z87891 Personal history of nicotine dependence: Secondary | ICD-10-CM | POA: Insufficient documentation

## 2015-06-03 DIAGNOSIS — H9190 Unspecified hearing loss, unspecified ear: Secondary | ICD-10-CM | POA: Insufficient documentation

## 2015-06-03 DIAGNOSIS — H409 Unspecified glaucoma: Secondary | ICD-10-CM | POA: Insufficient documentation

## 2015-06-03 DIAGNOSIS — F419 Anxiety disorder, unspecified: Secondary | ICD-10-CM | POA: Diagnosis not present

## 2015-06-03 DIAGNOSIS — R0682 Tachypnea, not elsewhere classified: Secondary | ICD-10-CM | POA: Insufficient documentation

## 2015-06-03 DIAGNOSIS — E039 Hypothyroidism, unspecified: Secondary | ICD-10-CM | POA: Insufficient documentation

## 2015-06-03 DIAGNOSIS — F329 Major depressive disorder, single episode, unspecified: Secondary | ICD-10-CM | POA: Insufficient documentation

## 2015-06-03 DIAGNOSIS — I110 Hypertensive heart disease with heart failure: Secondary | ICD-10-CM | POA: Insufficient documentation

## 2015-06-03 DIAGNOSIS — Z791 Long term (current) use of non-steroidal anti-inflammatories (NSAID): Secondary | ICD-10-CM | POA: Diagnosis not present

## 2015-06-03 DIAGNOSIS — R072 Precordial pain: Principal | ICD-10-CM | POA: Insufficient documentation

## 2015-06-03 DIAGNOSIS — J449 Chronic obstructive pulmonary disease, unspecified: Secondary | ICD-10-CM | POA: Diagnosis not present

## 2015-06-03 DIAGNOSIS — R079 Chest pain, unspecified: Secondary | ICD-10-CM | POA: Diagnosis present

## 2015-06-03 DIAGNOSIS — R110 Nausea: Secondary | ICD-10-CM | POA: Diagnosis not present

## 2015-06-03 DIAGNOSIS — Z951 Presence of aortocoronary bypass graft: Secondary | ICD-10-CM | POA: Diagnosis not present

## 2015-06-03 DIAGNOSIS — Z66 Do not resuscitate: Secondary | ICD-10-CM | POA: Insufficient documentation

## 2015-06-03 DIAGNOSIS — I2510 Atherosclerotic heart disease of native coronary artery without angina pectoris: Secondary | ICD-10-CM | POA: Insufficient documentation

## 2015-06-03 DIAGNOSIS — R0602 Shortness of breath: Secondary | ICD-10-CM | POA: Diagnosis not present

## 2015-06-03 LAB — BASIC METABOLIC PANEL
Anion gap: 3 — ABNORMAL LOW (ref 5–15)
BUN: 21 mg/dL — ABNORMAL HIGH (ref 6–20)
CALCIUM: 8.6 mg/dL — AB (ref 8.9–10.3)
CO2: 32 mmol/L (ref 22–32)
CREATININE: 1.03 mg/dL (ref 0.61–1.24)
Chloride: 104 mmol/L (ref 101–111)
GFR calc non Af Amer: >60 mL/min (ref >60)
GLUCOSE: 96 mg/dL (ref 65–99)
Potassium: 3.8 mmol/L (ref 3.5–5.1)
Sodium: 139 mmol/L (ref 135–145)

## 2015-06-03 LAB — CBC
HCT: 32.9 % — ABNORMAL LOW (ref 40.0–52.0)
HEMATOCRIT: 32.4 % — AB (ref 40.0–52.0)
Hemoglobin: 10.9 g/dL — ABNORMAL LOW (ref 13.0–18.0)
Hemoglobin: 10.6 g/dL — ABNORMAL LOW (ref 13.0–18.0)
MCH: 27.5 pg (ref 26.0–34.0)
MCH: 27 pg (ref 26.0–34.0)
MCHC: 33 g/dL (ref 32.0–36.0)
MCHC: 32.7 g/dL (ref 32.0–36.0)
MCV: 83.3 fL (ref 80.0–100.0)
MCV: 82.7 fL (ref 80.0–100.0)
PLATELETS: 115 10*3/uL — AB (ref 150–440)
Platelets: 116 10*3/uL — ABNORMAL LOW (ref 150–440)
RBC: 3.95 MIL/uL — AB (ref 4.40–5.90)
RBC: 3.92 MIL/uL — ABNORMAL LOW (ref 4.40–5.90)
RDW: 16.5 % — AB (ref 11.5–14.5)
RDW: 16.9 % — AB (ref 11.5–14.5)
WBC: 8.1 10*3/uL (ref 3.8–10.6)
WBC: 7.3 10*3/uL (ref 3.8–10.6)

## 2015-06-03 LAB — CREATININE, SERUM
Creatinine, Ser: 1.04 mg/dL (ref 0.61–1.24)
GFR calc Af Amer: >60 mL/min (ref >60)

## 2015-06-03 LAB — TROPONIN I: Troponin I: <0.03 ng/mL (ref <0.031)

## 2015-06-03 MED ORDER — ASPIRIN EC 81 MG PO TBEC
81.0000 mg | DELAYED_RELEASE_TABLET | Freq: Every evening | ORAL | Status: DC
  Administered 2015-06-03: 81 mg via ORAL
  Filled 2015-06-03: qty 1

## 2015-06-03 MED ORDER — ALPRAZOLAM 0.25 MG PO TABS: 0.2500 mg | ORAL_TABLET | Freq: Three times a day (TID) | ORAL | Status: DC | PRN

## 2015-06-03 MED ORDER — MORPHINE SULFATE (PF) 2 MG/ML IV SOLN
1.0000 mg | INTRAVENOUS | Status: DC | PRN
  Administered 2015-06-03 – 2015-06-04 (×2): 1 mg via INTRAVENOUS
  Filled 2015-06-03 (×2): qty 1

## 2015-06-03 MED ORDER — POLYETHYLENE GLYCOL 3350 17 G PO PACK: 17.0000 g | PACK | Freq: Every day | ORAL | Status: DC | PRN

## 2015-06-03 MED ORDER — FINASTERIDE 5 MG PO TABS
5.0000 mg | ORAL_TABLET | Freq: Every day | ORAL | Status: DC
  Administered 2015-06-04: 5 mg via ORAL
  Filled 2015-06-03: qty 1

## 2015-06-03 MED ORDER — DIPHENHYDRAMINE HCL 25 MG PO CAPS: 25.0000 mg | ORAL_CAPSULE | Freq: Every evening | ORAL | Status: DC | PRN

## 2015-06-03 MED ORDER — METOCLOPRAMIDE HCL 10 MG PO TABS
10.0000 mg | ORAL_TABLET | Freq: Three times a day (TID) | ORAL | Status: DC
  Administered 2015-06-04: 10 mg via ORAL
  Filled 2015-06-03: qty 1

## 2015-06-03 MED ORDER — LATANOPROST 0.005 % OP SOLN
1.0000 [drp] | Freq: Every day | OPHTHALMIC | Status: DC
  Administered 2015-06-03: 1 [drp] via OPHTHALMIC
  Filled 2015-06-03: qty 2.5

## 2015-06-03 MED ORDER — ZOLPIDEM TARTRATE 5 MG PO TABS: 5.0000 mg | ORAL_TABLET | Freq: Every evening | ORAL | Status: DC | PRN

## 2015-06-03 MED ORDER — TIMOLOL MALEATE 0.5 % OP SOLN
1.0000 [drp] | Freq: Two times a day (BID) | OPHTHALMIC | Status: DC
  Administered 2015-06-03 – 2015-06-04 (×2): 1 [drp] via OPHTHALMIC
  Filled 2015-06-03: qty 5

## 2015-06-03 MED ORDER — NITROGLYCERIN 0.4 MG SL SUBL
0.4000 mg | SUBLINGUAL_TABLET | SUBLINGUAL | Status: DC | PRN
  Administered 2015-06-03: 0.4 mg via SUBLINGUAL

## 2015-06-03 MED ORDER — LEVOTHYROXINE SODIUM 50 MCG PO TABS
25.0000 ug | ORAL_TABLET | Freq: Every day | ORAL | Status: DC
  Administered 2015-06-04: 25 ug via ORAL
  Filled 2015-06-03: qty 1

## 2015-06-03 MED ORDER — ONDANSETRON HCL 4 MG PO TABS: 4.0000 mg | ORAL_TABLET | Freq: Two times a day (BID) | ORAL | Status: DC | PRN

## 2015-06-03 MED ORDER — MELOXICAM 7.5 MG PO TABS
7.5000 mg | ORAL_TABLET | Freq: Every day | ORAL | Status: DC
  Administered 2015-06-04: 7.5 mg via ORAL
  Filled 2015-06-03 (×3): qty 1

## 2015-06-03 MED ORDER — ENOXAPARIN SODIUM 40 MG/0.4ML ~~LOC~~ SOLN
40.0000 mg | SUBCUTANEOUS | Status: DC
  Administered 2015-06-03: 40 mg via SUBCUTANEOUS
  Filled 2015-06-03: qty 0.4

## 2015-06-03 MED ORDER — PANTOPRAZOLE SODIUM 40 MG PO TBEC
40.0000 mg | DELAYED_RELEASE_TABLET | Freq: Every day | ORAL | Status: DC
  Administered 2015-06-04: 40 mg via ORAL
  Filled 2015-06-03: qty 1

## 2015-06-03 MED ORDER — SODIUM CHLORIDE 0.9% FLUSH
3.0000 mL | Freq: Two times a day (BID) | INTRAVENOUS | Status: DC
  Administered 2015-06-03 – 2015-06-04 (×2): 3 mL via INTRAVENOUS

## 2015-06-03 MED ORDER — DIGOXIN 125 MCG PO TABS
0.1250 mg | ORAL_TABLET | Freq: Every day | ORAL | Status: DC
  Administered 2015-06-04: 0.125 mg via ORAL
  Filled 2015-06-03: qty 1

## 2015-06-03 MED ORDER — CHOLECALCIFEROL 10 MCG (400 UNIT) PO TABS
400.0000 [IU] | ORAL_TABLET | Freq: Every day | ORAL | Status: DC
  Administered 2015-06-04: 400 [IU] via ORAL
  Filled 2015-06-03: qty 1

## 2015-06-03 MED ORDER — MIRTAZAPINE 15 MG PO TABS
30.0000 mg | ORAL_TABLET | Freq: Every day | ORAL | Status: DC
  Administered 2015-06-03: 30 mg via ORAL
  Filled 2015-06-03: qty 2

## 2015-06-03 MED ORDER — QUETIAPINE FUMARATE 25 MG PO TABS
25.0000 mg | ORAL_TABLET | Freq: Three times a day (TID) | ORAL | Status: DC
Start: 2015-06-03 — End: 2015-06-04
  Administered 2015-06-03 – 2015-06-04 (×3): 25 mg via ORAL
  Filled 2015-06-03 (×2): qty 1

## 2015-06-03 MED ORDER — NITROGLYCERIN 0.4 MG SL SUBL
SUBLINGUAL_TABLET | SUBLINGUAL | Status: AC
  Administered 2015-06-03: 0.4 mg via SUBLINGUAL
  Filled 2015-06-03: qty 3

## 2015-06-03 MED ORDER — ESCITALOPRAM OXALATE 10 MG PO TABS
15.0000 mg | ORAL_TABLET | Freq: Every day | ORAL | Status: DC
  Administered 2015-06-04: 15 mg via ORAL
  Filled 2015-06-03: qty 2

## 2015-06-03 MED ORDER — MOMETASONE FURO-FORMOTEROL FUM 200-5 MCG/ACT IN AERO
2.0000 | INHALATION_SPRAY | Freq: Two times a day (BID) | RESPIRATORY_TRACT | Status: DC
  Administered 2015-06-03 – 2015-06-04 (×2): 2 via RESPIRATORY_TRACT
  Filled 2015-06-03: qty 8.8

## 2015-06-03 MED ORDER — IPRATROPIUM-ALBUTEROL 0.5-2.5 (3) MG/3ML IN SOLN: 3.0000 mL | Freq: Three times a day (TID) | RESPIRATORY_TRACT | Status: DC | PRN

## 2015-06-03 MED ORDER — LISINOPRIL 5 MG PO TABS
2.5000 mg | ORAL_TABLET | Freq: Every day | ORAL | Status: DC
  Administered 2015-06-04: 2.5 mg via ORAL
  Filled 2015-06-03: qty 1

## 2015-06-03 MED ORDER — QUETIAPINE FUMARATE 25 MG PO TABS
50.0000 mg | ORAL_TABLET | Freq: Every day | ORAL | Status: DC
  Administered 2015-06-03: 50 mg via ORAL
  Filled 2015-06-03 (×2): qty 2

## 2015-06-03 MED ORDER — BRIMONIDINE TARTRATE-TIMOLOL 0.2-0.5 % OP SOLN: 1.0000 [drp] | Freq: Two times a day (BID) | OPHTHALMIC | Status: DC

## 2015-06-03 MED ORDER — FUROSEMIDE 40 MG PO TABS
40.0000 mg | ORAL_TABLET | Freq: Every day | ORAL | Status: DC
  Administered 2015-06-04: 40 mg via ORAL
  Filled 2015-06-03: qty 1

## 2015-06-03 MED ORDER — BRIMONIDINE TARTRATE 0.2 % OP SOLN
1.0000 [drp] | Freq: Two times a day (BID) | OPHTHALMIC | Status: DC
  Administered 2015-06-03 – 2015-06-04 (×2): 1 [drp] via OPHTHALMIC
  Filled 2015-06-03: qty 5

## 2015-06-03 MED ORDER — NITROGLYCERIN 0.4 MG SL SUBL: 0.4000 mg | SUBLINGUAL_TABLET | SUBLINGUAL | Status: DC | PRN

## 2015-06-03 MED ORDER — DORZOLAMIDE HCL 2 % OP SOLN
1.0000 [drp] | Freq: Two times a day (BID) | OPHTHALMIC | Status: DC
  Administered 2015-06-03 – 2015-06-04 (×2): 1 [drp] via OPHTHALMIC
  Filled 2015-06-03: qty 10

## 2015-06-03 MED ORDER — ADULT MULTIVITAMIN W/MINERALS CH
1.0000 | ORAL_TABLET | Freq: Every day | ORAL | Status: DC
  Administered 2015-06-04: 1 via ORAL
  Filled 2015-06-03: qty 1

## 2015-06-03 MED ORDER — METOPROLOL TARTRATE 25 MG PO TABS: 12.5000 mg | ORAL_TABLET | Freq: Every day | ORAL | Status: DC

## 2015-06-03 MED ORDER — ACETAMINOPHEN 650 MG RE SUPP: 650.0000 mg | Freq: Four times a day (QID) | RECTAL | Status: DC | PRN

## 2015-06-03 MED ORDER — ALBUTEROL SULFATE (2.5 MG/3ML) 0.083% IN NEBU: 2.5000 mg | INHALATION_SOLUTION | RESPIRATORY_TRACT | Status: DC | PRN

## 2015-06-03 MED ORDER — DICYCLOMINE HCL 20 MG PO TABS
20.0000 mg | ORAL_TABLET | Freq: Three times a day (TID) | ORAL | Status: DC | PRN
  Filled 2015-06-03: qty 1

## 2015-06-03 MED ORDER — SIMETHICONE 80 MG PO CHEW: 80.0000 mg | CHEWABLE_TABLET | ORAL | Status: DC | PRN

## 2015-06-03 MED ORDER — ACETAMINOPHEN 325 MG PO TABS
650.0000 mg | ORAL_TABLET | Freq: Four times a day (QID) | ORAL | Status: DC | PRN
  Administered 2015-06-04: 650 mg via ORAL
  Filled 2015-06-03: qty 2

## 2015-06-03 MED ORDER — DEXTROMETHORPHAN-GUAIFENESIN 10-100 MG/5ML PO LIQD
10.0000 mL | ORAL | Status: DC | PRN
  Filled 2015-06-03: qty 10

## 2015-06-03 NOTE — ED Notes (Signed)
PT to [facility] via EMS from [facility].  EMS report pt c/o central cp x 1 day, non radiatiating, w/ sob and nausea.  PT hx COPD on 2L o2 normally.  324 ASA given, no nitro.  Pt NAD upon arrival, respirations slightly tachypneic, skin warm and dry.

## 2015-06-03 NOTE — ED Provider Notes (Signed)
[facility] Emergency Department Provider Note  ____________________________________________  Time seen: 6:00 PM  I have reviewed the triage vital signs and the nursing notes.   HISTORY  Chief Complaint Chest Pain    HPI Darrell Mcconnell is a 80 y.o. male who complains of central chest pain described as heaviness and aching pressure since this morning. Intermittent episodes lasting 30-60 minutes at a time. Nonradiating. Associated with shortness of breath diaphoresis and nausea. Not pleuritic. Worse with exertion, better with rest. He has a history of CHF and hypertension and CABG. Currently chest pain as moderate in intensity.  Patient was given 324 of aspirin by EMS.   Past Medical History  Diagnosis Date  . Chronic systolic CHF (congestive heart failure) (HCC)   . COPD (chronic obstructive pulmonary disease) (HCC)   . Aorta aneurysm (HCC)   . Asthma   . Hypertension   . Anxiety   . A-fib (HCC)   . Depression   . Hypothyroidism   . GERD (gastroesophageal reflux disease)   . Dementia   . BPH (benign prostatic hyperplasia)      Patient Active Problem List   Diagnosis Date Noted  . Chronic systolic CHF (congestive heart failure) (HCC) 01/04/2015  . GERD (gastroesophageal reflux disease) 01/04/2015  . HTN (hypertension) 01/04/2015  . Anxiety 01/04/2015  . Chronic a-fib (HCC) 01/04/2015  . Dementia 01/04/2015  . COPD (chronic obstructive pulmonary disease) (HCC) 01/04/2015  . BPH (benign prostatic hyperplasia) 01/04/2015  . HCAP (healthcare-associated pneumonia) 07/04/2014  . COPD exacerbation (HCC) 07/04/2014  . Aneurysm of aorta (HCC) 07/04/2014     Past Surgical History  Procedure Laterality Date  . Coronary artery bypass graft    . Prostate surgery    . Abdominal aortic aneurysm repair       Current Outpatient Rx  Name  Route  Sig  Dispense  Refill  . albuterol (PROVENTIL HFA;VENTOLIN HFA) 108 (90 Base) MCG/ACT inhaler   Inhalation    Inhale 1 puff into the lungs every 6 (six) hours as needed for wheezing or shortness of breath.         . ALPRAZolam (XANAX) 0.25 MG tablet   Oral   Take 0.25 mg by mouth every 8 (eight) hours as needed for anxiety.          Marland Kitchen. amoxicillin-clavulanate (AUGMENTIN) 875-125 MG tablet   Oral   Take 1 tablet by mouth 2 (two) times daily.   10 tablet   0   . aspirin EC 81 MG tablet   Oral   Take 81 mg by mouth every evening.          . bimatoprost (LUMIGAN) 0.01 % SOLN   Both Eyes   Place 1 drop into both eyes at bedtime.          . brimonidine-timolol (COMBIGAN) 0.2-0.5 % ophthalmic solution   Both Eyes   Place 1 drop into both eyes 2 (two) times daily.         . cholecalciferol (VITAMIN D) 400 UNITS TABS tablet   Oral   Take 400 Units by mouth daily.         . digoxin (LANOXIN) 0.125 MG tablet   Oral   Take 0.125 mg by mouth daily.         Marland Kitchen. docusate sodium (COLACE) 100 MG capsule   Oral   Take 100 mg by mouth 2 (two) times daily.         Marland Kitchen. doxycycline (VIBRAMYCIN) 50 MG  capsule   Oral   Take 2 capsules (100 mg total) by mouth 2 (two) times daily.   20 capsule   0   . escitalopram (LEXAPRO) 10 MG tablet   Oral   Take 15 mg by mouth daily.         . finasteride (PROSCAR) 5 MG tablet   Oral   Take 5 mg by mouth daily.         . Fluticasone-Salmeterol (ADVAIR) 250-50 MCG/DOSE AEPB   Inhalation   Inhale 1 puff into the lungs 2 (two) times daily.         . furosemide (LASIX) 40 MG tablet   Oral   Take 40 mg by mouth daily.         . hydrocortisone cream 1 %   Topical   Apply 1 application topically as needed for itching (and rash).          Marland Kitchen ipratropium-albuterol (DUONEB) 0.5-2.5 (3) MG/3ML SOLN   Nebulization   Take 3 mLs by nebulization 3 (three) times daily as needed (for shortness of breath/wheezing).          Marland Kitchen levothyroxine (SYNTHROID, LEVOTHROID) 25 MCG tablet   Oral   Take 25 mcg by mouth daily.          Marland Kitchen lisinopril  (PRINIVIL,ZESTRIL) 2.5 MG tablet   Oral   Take 2.5 mg by mouth daily.         . meloxicam (MOBIC) 7.5 MG tablet   Oral   Take 7.5 mg by mouth daily.         . metoCLOPramide (REGLAN) 10 MG tablet   Oral   Take 10 mg by mouth 3 (three) times daily before meals.         . metoprolol tartrate (LOPRESSOR) 25 MG tablet   Oral   Take 12.5 mg by mouth daily.          . pantoprazole (PROTONIX) 40 MG tablet   Oral   Take 40 mg by mouth daily.         . polyethylene glycol (MIRALAX / GLYCOLAX) packet   Oral   Take 17 g by mouth daily as needed for moderate constipation.         . predniSONE (DELTASONE) 10 MG tablet   Oral   Take 1 tablet (10 mg total) by mouth daily with breakfast.   20 tablet   0     Label  & dispense according to the schedule below: ...   . Prenatal Vit-Fe Fumarate-FA (PREPLUS) 27-1 MG TABS   Oral   Take 1 tablet by mouth daily.         . QUEtiapine (SEROQUEL) 25 MG tablet   Oral   Take 25 mg by mouth 3 (three) times daily.         . QUEtiapine (SEROQUEL) 50 MG tablet   Oral   Take 50 mg by mouth at bedtime.         . Skin Protectants, Misc. (EUCERIN) cream   Topical   Apply 1 application topically 2 (two) times daily. Pt applies to arms and legs.            Allergies Review of patient's allergies indicates no known allergies.   Family History  Problem Relation Age of Onset  . CAD Father     Social History Social History  Substance Use Topics  . Smoking status: Former Games developer  . Smokeless tobacco: None  . Alcohol Use: No  Review of Systems  Constitutional:   No fever or chills.  Eyes:   No vision changes.  ENT:   No sore throat. No rhinorrhea. Cardiovascular:   Positive as above chest pain. Respiratory:   Positive shortness of breath with chest pain, no cough. Gastrointestinal:   Negative for abdominal pain, vomiting and diarrhea.  No bloody stool. Genitourinary:   Negative for dysuria or difficulty  urinating. Musculoskeletal:   Negative for focal pain or swelling Neurological:   Negative for headaches 10-point ROS otherwise negative.  ____________________________________________   PHYSICAL EXAM:  VITAL SIGNS: ED Triage Vitals  Enc Vitals Group     BP 06/03/15 1754 146/94 mmHg     Pulse Rate 06/03/15 1754 84     Resp 06/03/15 1754 22     Temp 06/03/15 1754 97.8 F (36.6 C)     Temp Source 06/03/15 1754 Oral     SpO2 06/03/15 1754 97 %     Weight 06/03/15 1754 164 lb 12.8 oz (74.753 kg)     Height 06/03/15 1754  (1.753 m)     Head Cir --      Peak Flow --      Pain Score 06/03/15 1755 7     Pain Loc --      Pain Edu? --      Excl. in GC? --     Vital signs reviewed, nursing assessments reviewed.   Constitutional:   Alert and oriented To person and place. Well appearing and in no distress. Eyes:   No scleral icterus. No conjunctival pallor. PERRL. EOMI ENT   Head:   Normocephalic and atraumatic.   Nose:   No congestion/rhinnorhea. No septal hematoma   Mouth/Throat:   MMM, no pharyngeal erythema. No peritonsillar mass.    Neck:   No stridor. No SubQ emphysema. No meningismus. No JVD Hematological/Lymphatic/Immunilogical:   No cervical lymphadenopathy. Cardiovascular:   RRR. Symmetric bilateral radial and DP pulses.  No murmurs.  Respiratory:   Normal respiratory effort without tachypnea nor retractions. Breath sounds are clear and equal bilaterally. No wheezes/rales/rhonchi. Gastrointestinal:   Soft and nontender. Non distended. There is no CVA tenderness.  No rebound, rigidity, or guarding. Genitourinary:   deferred Musculoskeletal:   Nontender with normal range of motion in all extremities. No joint effusions.  No lower extremity tenderness.  No edema. Neurologic:   Normal speech and language.  CN 2-10 normal. Motor grossly intact. No gross focal neurologic deficits are appreciated.  Skin:    Skin is warm, dry and intact. No rash noted.  No  petechiae, purpura, or bullae.  ____________________________________________    LABS (pertinent positives/negatives) (all labs ordered are listed, but only abnormal results are displayed) Labs Reviewed  BASIC METABOLIC PANEL - Abnormal; Notable for the following:    BUN 21 (*)    Calcium 8.6 (*)    Anion gap 3 (*)    All other components within normal limits  CBC - Abnormal; Notable for the following:    RBC 3.95 (*)    Hemoglobin 10.9 (*)    HCT 32.9 (*)    RDW 16.5 (*)    Platelets 115 (*)    All other components within normal limits  TROPONIN I   ____________________________________________   EKG  Interpreted by me Atrial fibrillation rate of 63, normal axis and intervals. Normal QRS ST segments and T waves.  ____________________________________________    RADIOLOGY  Chest x-ray shows no acute pathology. Small right pleural effusion, persistent opacity  of the right lower lobe, atelectasis versus consolidation.  ____________________________________________   PROCEDURES   ____________________________________________   INITIAL IMPRESSION / ASSESSMENT AND PLAN / ED COURSE  Pertinent labs & imaging results that were available during my care of the patient were reviewed by me and considered in my medical decision making (see chart for details).  Patient presents with chest pain with his medical history and the nature of the history of present illness findings, concerning for ischemia. No acute ischemic changes on EKG. Labs unremarkable, chest x-ray suggests possible right base infiltrate but without cough or fever or elevated white count I do not think this is consistent with pneumonia. Has not had recent cardiac workup. Discussed the case with the hospitalist for hospitalization and further evaluation. Patient was given 1 nitroglycerin which resolved his pain.     ____________________________________________   FINAL CLINICAL IMPRESSION(S) / ED  DIAGNOSES  Final diagnoses:  Precordial chest pain       Portions of this note were generated with dragon dictation software. Dictation errors may occur despite best attempts at proofreading.   Sharman Cheek, MD 06/03/15 [phone]

## 2015-06-03 NOTE — H&P (Signed)
[facility] - [place] at [facility]   PATIENT NAME: Darrell Mcconnell    MR#:  [id number]  DATE OF BIRTH:  [birth year]  DATE OF ADMISSION:  06/03/2015  PRIMARY CARE PHYSICIAN: MODY, SITAL, MD   REQUESTING/REFERRING PHYSICIAN: Dr. Alfonse Flavors  CHIEF COMPLAINT:   Chief Complaint  Patient presents with  . Chest Pain    HISTORY OF PRESENT ILLNESS:  Darrell Mcconnell  is a 80 y.o. male with a known history of COPD, hypertension, chronic atrial fibrillation, depression, hypothyroidism, dementia, GERD, BPH, history of chronic systolic CHF, history of coronary disease status post bypass who presents to the hospital complaining of chest pain. Patient describes his chest pain being substernal in nature and nonradiating associated with some shortness of breath and nausea but no vomiting. She says this chest pain lasted about an hour until he received some nitroglycerin and since then it has improved and resolved now. Given patient's significant cardiac risk factors hospitalist services were contacted further treatment and evaluation. Presently patient is chest pain-free and hemodynamically stable. He denies any abdominal pain, diarrhea, nausea, vomiting. No fever, chills no cough or congestion or any other associated symptoms presently.  PAST MEDICAL HISTORY:   Past Medical History  Diagnosis Date  . Chronic systolic CHF (congestive heart failure) (HCC)   . COPD (chronic obstructive pulmonary disease) (HCC)   . Aorta aneurysm (HCC)   . Asthma   . Hypertension   . Anxiety   . A-fib (HCC)   . Depression   . Hypothyroidism   . GERD (gastroesophageal reflux disease)   . Dementia   . BPH (benign prostatic hyperplasia)     PAST SURGICAL HISTORY:   Past Surgical History  Procedure Laterality Date  . Coronary artery bypass graft    . Prostate surgery    . Abdominal aortic aneurysm repair      SOCIAL HISTORY:   Social History  Substance Use Topics  . Smoking status:  Former Smoker -- 50 years  . Smokeless tobacco: Not on file  . Alcohol Use: No    FAMILY HISTORY:   Family History  Problem Relation Age of Onset  . CAD Father   . Cancer Father     DRUG ALLERGIES:  No Known Allergies  REVIEW OF SYSTEMS:   Review of Systems  Constitutional: Negative for fever and weight loss.  HENT: Negative for congestion, nosebleeds and tinnitus.   Eyes: Negative for blurred vision, double vision and redness.  Respiratory: Negative for cough, hemoptysis and shortness of breath.   Cardiovascular: Positive for chest pain. Negative for orthopnea, leg swelling and PND.  Gastrointestinal: Negative for nausea, vomiting, abdominal pain, diarrhea and melena.  Genitourinary: Negative for dysuria, urgency and hematuria.  Musculoskeletal: Negative for joint pain and falls.  Neurological: Negative for dizziness, tingling, sensory change, focal weakness, seizures, weakness and headaches.  Endo/Heme/Allergies: Negative for polydipsia. Does not bruise/bleed easily.  Psychiatric/Behavioral: Negative for depression and memory loss. The patient is not nervous/anxious.     MEDICATIONS AT HOME:   Prior to Admission medications   Medication Sig Start Date End Date Taking? Authorizing Provider  acetaminophen (TYLENOL) 650 MG CR tablet Take 650 mg by mouth 2 (two) times daily as needed for pain.   Yes Historical Provider, MD  albuterol (PROVENTIL HFA;VENTOLIN HFA) 108 (90 Base) MCG/ACT inhaler Inhale 2 puffs into the lungs every 4 (four) hours as needed for wheezing or shortness of breath.    Yes Historical Provider, MD  ALPRAZolam Prudy Feeler) 0.25  MG tablet Take 0.25 mg by mouth every 8 (eight) hours as needed for anxiety.    Yes Historical Provider, MD  aspirin EC 81 MG tablet Take 81 mg by mouth every evening.    Yes Historical Provider, MD  bimatoprost (LUMIGAN) 0.01 % SOLN Place 1 drop into both eyes at bedtime.    Yes Historical Provider, MD  brimonidine-timolol (COMBIGAN)  0.2-0.5 % ophthalmic solution Place 1 drop into both eyes 2 (two) times daily.   Yes Historical Provider, MD  cholecalciferol (VITAMIN D) 400 UNITS TABS tablet Take 400 Units by mouth daily.   Yes Historical Provider, MD  dextromethorphan-guaiFENesin (ROBAFEN DM CGH/CHEST CONGEST) 10-100 MG/5ML liquid Take 10 mLs by mouth every 4 (four) hours as needed for cough.   Yes Historical Provider, MD  dicyclomine (BENTYL) 20 MG tablet Take 20 mg by mouth every 8 (eight) hours as needed for spasms.   Yes Historical Provider, MD  digoxin (LANOXIN) 0.125 MG tablet Take 0.125 mg by mouth daily.   Yes Historical Provider, MD  diphenhydrAMINE (BENADRYL) 25 mg capsule Take 25 mg by mouth at bedtime as needed for itching.   Yes Historical Provider, MD  docusate sodium (COLACE) 100 MG capsule Take 100 mg by mouth 2 (two) times daily.   Yes Historical Provider, MD  dorzolamide (TRUSOPT) 2 % ophthalmic solution Place 1 drop into both eyes 2 (two) times daily.   Yes Historical Provider, MD  escitalopram (LEXAPRO) 10 MG tablet Take 15 mg by mouth daily.   Yes Historical Provider, MD  finasteride (PROSCAR) 5 MG tablet Take 5 mg by mouth daily.   Yes Historical Provider, MD  Fluticasone-Salmeterol (ADVAIR) 250-50 MCG/DOSE AEPB Inhale 1 puff into the lungs 2 (two) times daily.   Yes Historical Provider, MD  furosemide (LASIX) 40 MG tablet Take 40 mg by mouth daily.   Yes Historical Provider, MD  hydrocortisone cream 1 % Apply 1 application topically as needed for itching.    Yes Historical Provider, MD  ipratropium-albuterol (DUONEB) 0.5-2.5 (3) MG/3ML SOLN Take 3 mLs by nebulization 3 (three) times daily as needed (for shortness of breath/wheezing).    Yes Historical Provider, MD  levothyroxine (SYNTHROID, LEVOTHROID) 25 MCG tablet Take 25 mcg by mouth daily.    Yes Historical Provider, MD  lisinopril (PRINIVIL,ZESTRIL) 2.5 MG tablet Take 2.5 mg by mouth daily.   Yes Historical Provider, MD  meloxicam (MOBIC) 7.5 MG tablet  Take 7.5 mg by mouth daily.   Yes Historical Provider, MD  metoCLOPramide (REGLAN) 10 MG tablet Take 10 mg by mouth 3 (three) times daily before meals.   Yes Historical Provider, MD  metoprolol tartrate (LOPRESSOR) 25 MG tablet Take 12.5 mg by mouth daily.    Yes Historical Provider, MD  mineral oil enema Place 1 enema rectally once as needed for severe constipation.   Yes Historical Provider, MD  mirtazapine (REMERON) 30 MG tablet Take 30 mg by mouth at bedtime.   Yes Historical Provider, MD  nystatin (MYCOSTATIN) 100000 UNIT/ML suspension Take 5 mLs by mouth every 4 (four) hours as needed (for thrush). Pt is to swish and spit.   Yes Historical Provider, MD  ondansetron (ZOFRAN) 4 MG tablet Take 4 mg by mouth 2 (two) times daily as needed for nausea or vomiting.   Yes Historical Provider, MD  pantoprazole (PROTONIX) 40 MG tablet Take 40 mg by mouth daily.   Yes Historical Provider, MD  polyethylene glycol (MIRALAX / GLYCOLAX) packet Take 17 g by mouth daily as  needed for moderate constipation.   Yes Historical Provider, MD  Prenatal Vit-Fe Fumarate-FA (PREPLUS) 27-1 MG TABS Take 1 tablet by mouth daily.   Yes Historical Provider, MD  QUEtiapine (SEROQUEL) 25 MG tablet Take 25 mg by mouth 3 (three) times daily.   Yes Historical Provider, MD  QUEtiapine (SEROQUEL) 50 MG tablet Take 50 mg by mouth at bedtime.   Yes Historical Provider, MD  simethicone (MYLICON) 80 MG chewable tablet Chew 80 mg by mouth every 4 (four) hours as needed for flatulence.   Yes Historical Provider, MD  Skin Protectants, Misc. (EUCERIN) cream Apply 1 application topically 2 (two) times daily. Pt applies to arms and legs.   Yes Historical Provider, MD  amoxicillin-clavulanate (AUGMENTIN) 875-125 MG tablet Take 1 tablet by mouth 2 (two) times daily. Patient not taking: Reported on 06/03/2015 02/20/15   Adrian SaranSital Mody, MD  doxycycline (VIBRAMYCIN) 50 MG capsule Take 2 capsules (100 mg total) by mouth 2 (two) times daily. Patient not  taking: Reported on 06/03/2015 02/20/15   Adrian SaranSital Mody, MD  predniSONE (DELTASONE) 10 MG tablet Take 1 tablet (10 mg total) by mouth daily with breakfast. Patient not taking: Reported on 06/03/2015 02/20/15   Adrian SaranSital Mody, MD      VITAL SIGNS:  Blood pressure 136/89, pulse 64, temperature 97.8 F (36.6 C), temperature source Oral, resp. rate 16, height 5\' 9"  (1.753 m), weight 74.753 kg (164 lb 12.8 oz), SpO2 97 %.  PHYSICAL EXAMINATION:  Physical Exam  GENERAL:  80 y.o.-year-old patient lying in the bed in acute distress.  EYES: Pupils equal, round, reactive to light and accommodation. No scleral icterus. Extraocular muscles intact.  HEENT: Head atraumatic, normocephalic. Oropharynx and nasopharynx clear. No oropharyngeal erythema, moist oral mucosa  NECK:  Supple, no jugular venous distention. No thyroid enlargement, no tenderness.  LUNGS: Normal breath sounds bilaterally, no wheezing, rales, rhonchi. No use of accessory muscles of respiration.  CARDIOVASCULAR: S1, S2 RRR. No murmurs, rubs, gallops, clicks.  ABDOMEN: Soft, nontender, nondistended. Bowel sounds present. No organomegaly or mass.  EXTREMITIES: +1-2 edema b/l, cyanosis, or clubbing. + 2 pedal & radial pulses b/l.   NEUROLOGIC: Cranial nerves II through XII are intact. No focal Motor or sensory deficits appreciated b/l PSYCHIATRIC: The patient is alert and oriented x 3. Good affect.  SKIN: No obvious rash, lesion, or ulcer.   LABORATORY PANEL:   CBC  Recent Labs Lab 06/03/15 1758  WBC 8.1  HGB 10.9*  HCT 32.9*  PLT 115*   ------------------------------------------------------------------------------------------------------------------  Chemistries   Recent Labs Lab 06/03/15 1758  NA 139  K 3.8  CL 104  CO2 32  GLUCOSE 96  BUN 21*  CREATININE 1.03  CALCIUM 8.6*   ------------------------------------------------------------------------------------------------------------------  Cardiac Enzymes  Recent  Labs Lab 06/03/15 1758  TROPONINI <0.03   ------------------------------------------------------------------------------------------------------------------  RADIOLOGY:  Dg Chest 2 View  06/03/2015  CLINICAL DATA:  Central chest pain for 1 day, nonradiating, associated with shortness of breath and nausea, tachypneic, history COPD, CHF, asthma, hypertension, dementia, former smoker, aortic aneurysm EXAM: CHEST  2 VIEW COMPARISON:  02/19/2015 FINDINGS: Borderline enlargement of cardiac silhouette post CABG. Atherosclerotic calcification aorta. Mediastinal contours and pulmonary vascularity normal. LEFT basilar atelectasis. RIGHT pleural effusion with atelectasis versus consolidation at RIGHT base though little changed since previous exam. Upper lungs clear. No pleural effusion or pneumothorax. Bones demineralized. IMPRESSION: Subsegmental atelectasis LEFT base. RIGHT pleural effusion with persistent versus recurrent atelectasis versus consolidation in RIGHT lower lobe. In the setting of a non resolving  pulmonary opacity, CT chest with contrast recommended for further assessment. Electronically Signed   By: Ulyses Southward M.D.   On: 06/03/2015 18:50     IMPRESSION AND PLAN:   80 year old male with past history of COPD, chronic systolic CHF, hypertension, glaucoma, BPH, depression, history of atrial fibrillation, history of aortic aneurysm, who presents to the hospital complaining of chest pain.  1. Chest pain-patient's chest pain is quite atypical but he does have significant risk factors given his history of coronary disease with bypass, AAA. -Observe him overnight on telemetry, cycle cardiac markers. Current EKG shows no acute ST or T-wave changes. -Continue aspirin, beta blocker, statin, nitroglycerin. I will get a nuclear medicine stress test in the morning.  2. COPD-no acute exacerbation. -Continue duo nebs, albuterol inhaler, Dulera.  3. History of chronic systolic CHF-clinically patient was  not in congestive heart failure. Continue Lasix, beta blocker, ACE inhibitor.  4. Cardizem-continue Synthroid.  5. Glaucoma-continue latanoprost, dorzolamide, Trusopt, Combigan.  6. GERD-continue Protonix.  7. BPH-continue Proscar  8. Depression - cont. Lexapro.     All the records are reviewed and case discussed with ED provider. Management plans discussed with the patient, family and they are in agreement.  CODE STATUS: DNR  TOTAL TIME TAKING CARE OF THIS PATIENT: 45 minutes.    Houston Siren M.D on 06/03/2015 at 8:11 PM  Between 7am to 6pm - Pager - [phone]  After 6pm go to [email or web address] - password [email or web address] [facility]  [facility]  Office  [phone]  CC: Primary care physician; MODY, Patricia Pesa, MD

## 2015-06-03 NOTE — ED Notes (Signed)
Pt attempting to get up despite redirection.

## 2015-06-04 ENCOUNTER — Observation Stay: Payer: Medicare Other

## 2015-06-04 DIAGNOSIS — J449 Chronic obstructive pulmonary disease, unspecified: Secondary | ICD-10-CM | POA: Diagnosis not present

## 2015-06-04 DIAGNOSIS — I5022 Chronic systolic (congestive) heart failure: Secondary | ICD-10-CM | POA: Diagnosis not present

## 2015-06-04 DIAGNOSIS — I10 Essential (primary) hypertension: Secondary | ICD-10-CM | POA: Diagnosis not present

## 2015-06-04 DIAGNOSIS — R0789 Other chest pain: Secondary | ICD-10-CM | POA: Diagnosis not present

## 2015-06-04 DIAGNOSIS — R072 Precordial pain: Secondary | ICD-10-CM | POA: Diagnosis not present

## 2015-06-04 LAB — NM MYOCAR MULTI W/SPECT W/WALL MOTION / EF: SRS: 1

## 2015-06-04 LAB — TROPONIN I: Troponin I: <0.03 ng/mL (ref <0.031)

## 2015-06-04 LAB — MRSA PCR SCREENING: MRSA by PCR: NEGATIVE

## 2015-06-04 MED ORDER — ONDANSETRON HCL 4 MG/2ML IJ SOLN
4.0000 mg | Freq: Four times a day (QID) | INTRAMUSCULAR | Status: DC | PRN
  Administered 2015-06-04: 4 mg via INTRAVENOUS

## 2015-06-04 MED ORDER — CETYLPYRIDINIUM CHLORIDE 0.05 % MT LIQD: 7.0000 mL | Freq: Two times a day (BID) | OROMUCOSAL | Status: DC

## 2015-06-04 MED ORDER — TECHNETIUM TC 99M SESTAMIBI - CARDIOLITE
12.2580 | Freq: Once | INTRAVENOUS | Status: AC | PRN
  Administered 2015-06-04: 11:00:00 12.258 via INTRAVENOUS

## 2015-06-04 MED ORDER — ONDANSETRON HCL 4 MG/2ML IJ SOLN
INTRAMUSCULAR | Status: AC
  Administered 2015-06-04: 4 mg via INTRAVENOUS
  Filled 2015-06-04: qty 2

## 2015-06-04 NOTE — [place] FL2 (Signed)
[place] MEDICAID FL2 LEVEL OF CARE SCREENING TOOL     IDENTIFICATION  Patient Name: Darrell Mcconnell Birthdate: [birth year] Sex: male Admission Date (Current Location): 06/03/2015  [facility] and [place] Number:  [name]  ([id number]) Facility and Address:  [facility], [address] [place], [place] [address]      Provider Number: [id number]  Attending Physician Name and Address:  Ramonita Lab, MD  Relative Name and Phone Number:       Current Level of Care: Hospital Recommended Level of Care: Assisted Living Facility Prior Approval Number:    Date Approved/Denied:   PASRR Number:  ( [id number] )  Discharge Plan: Domiciliary (Rest home)    Current Diagnoses: Patient Active Problem List   Diagnosis Date Noted  . Chest pain 06/03/2015  . Chronic systolic CHF (congestive heart failure) (HCC) 01/04/2015  . GERD (gastroesophageal reflux disease) 01/04/2015  . HTN (hypertension) 01/04/2015  . Anxiety 01/04/2015  . Chronic a-fib (HCC) 01/04/2015  . Dementia 01/04/2015  . COPD (chronic obstructive pulmonary disease) (HCC) 01/04/2015  . BPH (benign prostatic hyperplasia) 01/04/2015  . HCAP (healthcare-associated pneumonia) 07/04/2014  . COPD exacerbation (HCC) 07/04/2014  . Aneurysm of aorta (HCC) 07/04/2014    Orientation RESPIRATION BLADDER Height & Weight     Self, Time, Situation, Place  O2 (2 Liters Oxygen Chronic ) Continent Weight: 157 lb 6.4 oz (71.396 kg) Height:   (175.3 cm)  BEHAVIORAL SYMPTOMS/MOOD NEUROLOGICAL BOWEL NUTRITION STATUS   (none )  (none ) Continent Diet (Diet: Heart Healthy )  AMBULATORY STATUS COMMUNICATION OF NEEDS Skin   Supervision Verbally Normal                       Personal Care Assistance Level of Assistance  Bathing, Feeding, Dressing Bathing Assistance: Independent Feeding assistance: Independent Dressing Assistance: Independent     Functional Limitations Info  Sight, Hearing, Speech Sight  Info: Impaired Hearing Info: Impaired Speech Info: Adequate    SPECIAL CARE FACTORS FREQUENCY  PT (By licensed PT)     PT Frequency:  (2-3 days per week via Home Health )              Contractures      Additional Factors Info  Code Status, Allergies Code Status Info:  (DNR ) Allergies Info:  (No Known Allergies. )          Discharge Medications: Please see discharge summary for a list of discharge medications. Current Discharge Medication List    CONTINUE these medications which have NOT CHANGED   Details  acetaminophen (TYLENOL) 650 MG CR tablet Take 650 mg by mouth 2 (two) times daily as needed for pain.    albuterol (PROVENTIL HFA;VENTOLIN HFA) 108 (90 Base) MCG/ACT inhaler Inhale 2 puffs into the lungs every 4 (four) hours as needed for wheezing or shortness of breath.     ALPRAZolam (XANAX) 0.25 MG tablet Take 0.25 mg by mouth every 8 (eight) hours as needed for anxiety.     aspirin EC 81 MG tablet Take 81 mg by mouth every evening.     bimatoprost (LUMIGAN) 0.01 % SOLN Place 1 drop into both eyes at bedtime.     brimonidine-timolol (COMBIGAN) 0.2-0.5 % ophthalmic solution Place 1 drop into both eyes 2 (two) times daily.    cholecalciferol (VITAMIN D) 400 UNITS TABS tablet Take 400 Units by mouth daily.    dextromethorphan-guaiFENesin (ROBAFEN DM CGH/CHEST CONGEST) 10-100 MG/5ML liquid Take 10 mLs by mouth every  4 (four) hours as needed for cough.    dicyclomine (BENTYL) 20 MG tablet Take 20 mg by mouth every 8 (eight) hours as needed for spasms.    digoxin (LANOXIN) 0.125 MG tablet Take 0.125 mg by mouth daily.    diphenhydrAMINE (BENADRYL) 25 mg capsule Take 25 mg by mouth at bedtime as needed for itching.    docusate sodium (COLACE) 100 MG capsule Take 100 mg by mouth 2 (two) times daily.    dorzolamide (TRUSOPT) 2 % ophthalmic solution Place 1 drop into both eyes 2 (two) times daily.    escitalopram  (LEXAPRO) 10 MG tablet Take 15 mg by mouth daily.    finasteride (PROSCAR) 5 MG tablet Take 5 mg by mouth daily.    Fluticasone-Salmeterol (ADVAIR) 250-50 MCG/DOSE AEPB Inhale 1 puff into the lungs 2 (two) times daily.    furosemide (LASIX) 40 MG tablet Take 40 mg by mouth daily.    hydrocortisone cream 1 % Apply 1 application topically as needed for itching.     ipratropium-albuterol (DUONEB) 0.5-2.5 (3) MG/3ML SOLN Take 3 mLs by nebulization 3 (three) times daily as needed (for shortness of breath/wheezing).     levothyroxine (SYNTHROID, LEVOTHROID) 25 MCG tablet Take 25 mcg by mouth daily.     lisinopril (PRINIVIL,ZESTRIL) 2.5 MG tablet Take 2.5 mg by mouth daily.    meloxicam (MOBIC) 7.5 MG tablet Take 7.5 mg by mouth daily.    metoCLOPramide (REGLAN) 10 MG tablet Take 10 mg by mouth 3 (three) times daily before meals.    metoprolol tartrate (LOPRESSOR) 25 MG tablet Take 12.5 mg by mouth daily.     mineral oil enema Place 1 enema rectally once as needed for severe constipation.    mirtazapine (REMERON) 30 MG tablet Take 30 mg by mouth at bedtime.    nystatin (MYCOSTATIN) 100000 UNIT/ML suspension Take 5 mLs by mouth every 4 (four) hours as needed (for thrush). Pt is to swish and spit.    ondansetron (ZOFRAN) 4 MG tablet Take 4 mg by mouth 2 (two) times daily as needed for nausea or vomiting.    pantoprazole (PROTONIX) 40 MG tablet Take 40 mg by mouth daily.    polyethylene glycol (MIRALAX / GLYCOLAX) packet Take 17 g by mouth daily as needed for moderate constipation.    Prenatal Vit-Fe Fumarate-FA (PREPLUS) 27-1 MG TABS Take 1 tablet by mouth daily.    !! QUEtiapine (SEROQUEL) 25 MG tablet Take 25 mg by mouth 3 (three) times daily.    !! QUEtiapine (SEROQUEL) 50 MG tablet Take 50 mg by mouth at bedtime.    simethicone (MYLICON) 80 MG chewable tablet Chew 80 mg by mouth every 4 (four) hours as needed for  flatulence.    Skin Protectants, Misc. (EUCERIN) cream Apply 1 application topically 2 (two) times daily. Pt applies to arms and legs.    amoxicillin-clavulanate (AUGMENTIN) 875-125 MG tablet Take 1 tablet by mouth 2 (two) times daily. Qty: 10 tablet, Refills: 0    doxycycline (VIBRAMYCIN) 50 MG capsule Take 2 capsules (100 mg total) by mouth 2 (two) times daily. Qty: 20 capsule, Refills: 0    predniSONE (DELTASONE) 10 MG tablet Take 1 tablet (10 mg total) by mouth daily with breakfast. Qty: 20 tablet, Refills: 0           Relevant Imaging Results:  Relevant Lab Results:   Additional Information  (SSN: [id number])  Haig ProphetMorgan, Bailey G, LCSW

## 2015-06-04 NOTE — Progress Notes (Signed)
Pt tearful about housing situation. Pt is unsure if he is able to go back to [facility] but unable to explain why he feels this way. Pt calmed down after reassurance from RN. Social work consult placed. Will continue to monitor.   Mayra NeerNesb[place], Ameirah Khatoon M

## 2015-06-04 NOTE — Progress Notes (Signed)
Patient is back from stress test and primary RN informed that test was not completed. Patient became very agitated and kept stating he couldn't breathe during the test. Patient still stating he feels he can't breathe, oxygen saturation is 97% on his chronic 2 liters. Patient has calmed down some now and is in bed. Staff from [facility] stated they would be notifying the cardiologist the test was not able to be performed and they would get in touch with Dr. Amado CoeGouru.

## 2015-06-04 NOTE — Discharge Summary (Addendum)
[facility] [place] at [facility]   PATIENT NAME: Darrell Mcconnell    MR#:  [id number]  DATE OF BIRTH:  05/12/1935  DATE OF ADMISSION:  06/03/2015 ADMITTING PHYSICIAN: Houston Siren, MD  DATE OF DISCHARGE: 06/04/15  PRIMARY CARE PHYSICIAN:     ADMISSION DIAGNOSIS:  Precordial chest pain [R07.2]  DISCHARGE DIAGNOSIS:  Atypical chest pain probably from anxiety  SECONDARY DIAGNOSIS:   Past Medical History  Diagnosis Date  . Chronic systolic CHF (congestive heart failure) (HCC)   . COPD (chronic obstructive pulmonary disease) (HCC)   . Aorta aneurysm (HCC)   . Asthma   . Hypertension   . Anxiety   . A-fib (HCC)   . Depression   . Hypothyroidism   . GERD (gastroesophageal reflux disease)   . Dementia   . BPH (benign prostatic hyperplasia)     HOSPITAL COURSE:   80 year old male with past history of COPD, chronic systolic CHF, hypertension, glaucoma, BPH, depression, history of atrial fibrillation, history of aortic aneurysm, who presents to the hospital complaining of chest pain.  1. Chest pain-probably anxiety related -patient's chest pain is quite atypical but he does have significant risk factors given his history of coronary disease with bypass, AAA. -Observed him overnight on telemetry, no significant events noticed. Negative cycled cardiac markers, 0.033. EKG shows no acute ST or T-wave changes. -Continue aspirin, beta blocker, statin, nitroglycerin.  Patient didn't let nuclear medicine stress test to complete because of his anxiety and test was discontinued by cardiology. -Offered psychiatry consult but patient refused -Outpatient follow-up with cardiology and patient can get outpatient stress test. PCP to consider referring the patient to outpatient cardiology in a week  2. COPD-no acute exacerbation. -Continue duo nebs, albuterol inhaler, Dulera.  3. History of chronic systolic CHF-clinically patient was not in congestive heart failure.  Continue Lasix, beta blocker, ACE inhibitor.  4. Cardizem-continue Synthroid.  5. Glaucoma-continue latanoprost, dorzolamide, Trusopt, Combigan.  6. GERD-continue Protonix.  7. BPH-continue Proscar  8. Depression - cont. Lexapro.  Patient is legally blind and hard of hearing  9. Chest x-ray with non-resolving opacity-outpatient CAT scan of the chest is recommended as recommended by radiology. PCP to consider ordering it  DISCHARGE CONDITIONS:   Fair  CONSULTS OBTAINED:      PROCEDURES -refused cardiac stress test  DRUG ALLERGIES:  No Known Allergies  DISCHARGE MEDICATIONS:   Current Discharge Medication List    CONTINUE these medications which have NOT CHANGED   Details  acetaminophen (TYLENOL) 650 MG CR tablet Take 650 mg by mouth 2 (two) times daily as needed for pain.    albuterol (PROVENTIL HFA;VENTOLIN HFA) 108 (90 Base) MCG/ACT inhaler Inhale 2 puffs into the lungs every 4 (four) hours as needed for wheezing or shortness of breath.     ALPRAZolam (XANAX) 0.25 MG tablet Take 0.25 mg by mouth every 8 (eight) hours as needed for anxiety.     aspirin EC 81 MG tablet Take 81 mg by mouth every evening.     bimatoprost (LUMIGAN) 0.01 % SOLN Place 1 drop into both eyes at bedtime.     brimonidine-timolol (COMBIGAN) 0.2-0.5 % ophthalmic solution Place 1 drop into both eyes 2 (two) times daily.    cholecalciferol (VITAMIN D) 400 UNITS TABS tablet Take 400 Units by mouth daily.    dextromethorphan-guaiFENesin (ROBAFEN DM CGH/CHEST CONGEST) 10-100 MG/5ML liquid Take 10 mLs by mouth every 4 (four) hours as needed for cough.    dicyclomine (BENTYL) 20 MG  tablet Take 20 mg by mouth every 8 (eight) hours as needed for spasms.    digoxin (LANOXIN) 0.125 MG tablet Take 0.125 mg by mouth daily.    diphenhydrAMINE (BENADRYL) 25 mg capsule Take 25 mg by mouth at bedtime as needed for itching.    docusate sodium (COLACE) 100 MG capsule Take 100 mg by mouth 2 (two) times  daily.    dorzolamide (TRUSOPT) 2 % ophthalmic solution Place 1 drop into both eyes 2 (two) times daily.    escitalopram (LEXAPRO) 10 MG tablet Take 15 mg by mouth daily.    finasteride (PROSCAR) 5 MG tablet Take 5 mg by mouth daily.    Fluticasone-Salmeterol (ADVAIR) 250-50 MCG/DOSE AEPB Inhale 1 puff into the lungs 2 (two) times daily.    furosemide (LASIX) 40 MG tablet Take 40 mg by mouth daily.    hydrocortisone cream 1 % Apply 1 application topically as needed for itching.     ipratropium-albuterol (DUONEB) 0.5-2.5 (3) MG/3ML SOLN Take 3 mLs by nebulization 3 (three) times daily as needed (for shortness of breath/wheezing).     levothyroxine (SYNTHROID, LEVOTHROID) 25 MCG tablet Take 25 mcg by mouth daily.     lisinopril (PRINIVIL,ZESTRIL) 2.5 MG tablet Take 2.5 mg by mouth daily.    meloxicam (MOBIC) 7.5 MG tablet Take 7.5 mg by mouth daily.    metoCLOPramide (REGLAN) 10 MG tablet Take 10 mg by mouth 3 (three) times daily before meals.    metoprolol tartrate (LOPRESSOR) 25 MG tablet Take 12.5 mg by mouth daily.     mineral oil enema Place 1 enema rectally once as needed for severe constipation.    mirtazapine (REMERON) 30 MG tablet Take 30 mg by mouth at bedtime.    nystatin (MYCOSTATIN) 100000 UNIT/ML suspension Take 5 mLs by mouth every 4 (four) hours as needed (for thrush). Pt is to swish and spit.    ondansetron (ZOFRAN) 4 MG tablet Take 4 mg by mouth 2 (two) times daily as needed for nausea or vomiting.    pantoprazole (PROTONIX) 40 MG tablet Take 40 mg by mouth daily.    polyethylene glycol (MIRALAX / GLYCOLAX) packet Take 17 g by mouth daily as needed for moderate constipation.    Prenatal Vit-Fe Fumarate-FA (PREPLUS) 27-1 MG TABS Take 1 tablet by mouth daily.    !! QUEtiapine (SEROQUEL) 25 MG tablet Take 25 mg by mouth 3 (three) times daily.    !! QUEtiapine (SEROQUEL) 50 MG tablet Take 50 mg by mouth at bedtime.    simethicone (MYLICON) 80 MG chewable tablet  Chew 80 mg by mouth every 4 (four) hours as needed for flatulence.    Skin Protectants, Misc. (EUCERIN) cream Apply 1 application topically 2 (two) times daily. Pt applies to arms and legs.    amoxicillin-clavulanate (AUGMENTIN) 875-125 MG tablet Take 1 tablet by mouth 2 (two) times daily. Qty: 10 tablet, Refills: 0    doxycycline (VIBRAMYCIN) 50 MG capsule Take 2 capsules (100 mg total) by mouth 2 (two) times daily. Qty: 20 capsule, Refills: 0    predniSONE (DELTASONE) 10 MG tablet Take 1 tablet (10 mg total) by mouth daily with breakfast. Qty: 20 tablet, Refills: 0     !! - Potential duplicate medications found. Please discuss with provider.       DISCHARGE INSTRUCTIONS:  Activity as tolerated Cardiac diet Follow-up with primary care physician in 5 days Follow-up with primary cardiology in a week for outpatient stress test Follow-up with psychiatry as an outpatient for  anxiety-PCP to refer the patient   DIET:  Cardiac diet  DISCHARGE CONDITION:  Fair  ACTIVITY:  Activity as tolerated  OXYGEN:  Home Oxygen: yes   Oxygen Delivery: chronic 2 L of oxygen via nasal cannula  DISCHARGE LOCATION:  Independent living facility  If you experience worsening of your admission symptoms, develop shortness of breath, life threatening emergency, suicidal or homicidal thoughts you must seek medical attention immediately by calling 911 or calling your MD immediately  if symptoms less severe.  You Must read complete instructions/literature along with all the possible adverse reactions/side effects for all the Medicines you take and that have been prescribed to you. Take any new Medicines after you have completely understood and accpet all the possible adverse reactions/side effects.   Please note  You were cared for by a hospitalist during your hospital stay. If you have any questions about your discharge medications or the care you received while you were in the hospital after you are  discharged, you can call the unit and asked to speak with the hospitalist on call if the hospitalist that took care of you is not available. Once you are discharged, your primary care physician will handle any further medical issues. Please note that NO REFILLS for any discharge medications will be authorized once you are discharged, as it is imperative that you return to your primary care physician (or establish a relationship with a primary care physician if you do not have one) for your aftercare needs so that they can reassess your need for medications and monitor your lab values.     Today  Chief Complaint  Patient presents with  . Chest Pain   Patient denies any chest pain today. Denies any shortness of breath. Legally blind and hard of hearing. Patient became anxious and refused stress test in the middle, which was discontinued as patient was panicking. Patient was offered psych consult but he refused it. Requesting to discharge him home  ROS:  CONSTITUTIONAL: Denies fevers, chills. Denies any fatigue, weakness.  EYES: Denies blurry vision, double vision, eye pain. EARS, NOSE, THROAT: Denies tinnitus, ear pain, hearing loss. RESPIRATORY: Denies cough, wheeze, shortness of breath.  CARDIOVASCULAR: Denies chest pain, palpitations, edema.  GASTROINTESTINAL: Denies nausea, vomiting, diarrhea, abdominal pain. Denies bright red blood per rectum. GENITOURINARY: Denies dysuria, hematuria. ENDOCRINE: Denies nocturia or thyroid problems. HEMATOLOGIC AND LYMPHATIC: Denies easy bruising or bleeding. SKIN: Denies rash or lesion. MUSCULOSKELETAL: Denies pain in neck, back, shoulder, knees, hips or arthritic symptoms.  NEUROLOGIC: Denies paralysis, paresthesias.  PSYCHIATRIC: Reporting anxiety , denies depressive symptoms.   VITAL SIGNS:  Blood pressure 160/88, pulse 79, temperature 97.8 F (36.6 C), temperature source Oral, resp. rate 24, height  (1.753 m), weight 71.396 kg (157 lb 6.4  oz), SpO2 97 %.  I/O:    Intake/Output Summary (Last 24 hours) at 06/04/15 1252 Last data filed at 06/04/15 1018  Gross per 24 hour  Intake      0 ml  Output    600 ml  Net   -600 ml    PHYSICAL EXAMINATION:  GENERAL:  80 y.o.-year-old patient lying in the bed with no acute distress.  EYES: Pupils equal, round, reactive to light and accommodation. No scleral icterus. Legally blind HEENT: Head atraumatic, normocephalic. Oropharynx and nasopharynx clear. Very hard of hearing NECK:  Supple, no jugular venous distention. No thyroid enlargement, no tenderness.  LUNGS: Normal breath sounds bilaterally, no wheezing, rales,rhonchi or crepitation. No use of accessory muscles of  respiration.  CARDIOVASCULAR: S1, S2 normal. No murmurs, rubs, or gallops.  ABDOMEN: Soft, non-tender, non-distended. Bowel sounds present. No organomegaly or mass.  EXTREMITIES: No pedal edema, cyanosis, or clubbing.  NEUROLOGIC: Cranial nerves II through XII are intact. Muscle strength 5/5 in all extremities. Sensation intact. Gait not checked.  PSYCHIATRIC: The patient is alert and oriented x 3. Anxious SKIN: No obvious rash, lesion, or ulcer.   DATA REVIEW:   CBC  Recent Labs Lab 06/03/15 2218  WBC 7.3  HGB 10.6*  HCT 32.4*  PLT 116*    Chemistries   Recent Labs Lab 06/03/15 1758 06/03/15 2218  NA 139  --   K 3.8  --   CL 104  --   CO2 32  --   GLUCOSE 96  --   BUN 21*  --   CREATININE 1.03 1.04  CALCIUM 8.6*  --     Cardiac Enzymes  Recent Labs Lab 06/04/15 0607  TROPONINI <0.03    Microbiology Results  Results for orders placed or performed during the hospital encounter of 06/03/15  MRSA PCR Screening     Status: None   Collection Time: 06/04/15 12:35 AM  Result Value Ref Range Status   MRSA by PCR NEGATIVE NEGATIVE Final    Comment:        The GeneXpert MRSA Assay (FDA approved for NASAL specimens only), is one component of a comprehensive MRSA colonization surveillance  program. It is not intended to diagnose MRSA infection nor to guide or monitor treatment for MRSA infections.     RADIOLOGY:  Dg Chest 2 View  06/03/2015  CLINICAL DATA:  Central chest pain for 1 day, nonradiating, associated with shortness of breath and nausea, tachypneic, history COPD, CHF, asthma, hypertension, dementia, former smoker, aortic aneurysm EXAM: CHEST  2 VIEW COMPARISON:  02/19/2015 FINDINGS: Borderline enlargement of cardiac silhouette post CABG. Atherosclerotic calcification aorta. Mediastinal contours and pulmonary vascularity normal. LEFT basilar atelectasis. RIGHT pleural effusion with atelectasis versus consolidation at RIGHT base though little changed since previous exam. Upper lungs clear. No pleural effusion or pneumothorax. Bones demineralized. IMPRESSION: Subsegmental atelectasis LEFT base. RIGHT pleural effusion with persistent versus recurrent atelectasis versus consolidation in RIGHT lower lobe. In the setting of a non resolving pulmonary opacity, CT chest with contrast recommended for further assessment. Electronically Signed   By: Ulyses Southward M.D.   On: 06/03/2015 18:50    EKG:   Orders placed or performed during the hospital encounter of 06/03/15  . EKG 12-Lead  . EKG 12-Lead  . ED EKG within 10 minutes  . ED EKG within 10 minutes      Management plans discussed with the patient, family and they are in agreement.  CODE STATUS:     Code Status Orders        Start     Ordered   06/03/15 2147  Do not attempt resuscitation (DNR)   Continuous    Question Answer Comment  In the event of cardiac or respiratory ARREST Do not call a "code blue"   In the event of cardiac or respiratory ARREST Do not perform Intubation, CPR, defibrillation or ACLS   In the event of cardiac or respiratory ARREST Use medication by any route, position, wound care, and other measures to relive pain and suffering. May use oxygen, suction and manual treatment of airway obstruction  as needed for comfort.      06/03/15 2147    Code Status History    Date Active Date  Inactive Code Status Order ID Comments User Context   02/20/2015  2:17 AM 02/20/2015  5:31 PM DNR [id number]  Oralia Manis, MD Inpatient   01/04/2015 10:15 PM 01/09/2015  5:25 PM DNR [id number]  Oralia Manis, MD Inpatient   07/04/2014 10:03 AM 07/05/2014  6:48 PM DNR [id number]  Altamese Dilling, MD Inpatient    Advance Directive Documentation        Most Recent Value   Type of Advance Directive  Out of facility DNR (pink MOST or yellow form)   Pre-existing out of facility DNR order (yellow form or pink MOST form)  Yellow form placed in chart (order not valid for inpatient use)   "MOST" Form in Place?        TOTAL TIME TAKING CARE OF THIS PATIENT: 45  minutes.    @[facility] @  on 06/04/2015 at 12:52 PM  Between 7am to 6pm - Pager - [phone]  After 6pm go to [email or web address] - password [email or web address] [facility]  [facility]  Office  [phone]  CC: Primary care physician; MODY, Patricia Pesa, MD

## 2015-06-04 NOTE — Progress Notes (Signed)
Transportation has arrived. IV and tele removed. AVS given to [name] as well as packet from Child psychotherapistsocial worker. Belongings packed. Will discharge now.

## 2015-06-04 NOTE — Progress Notes (Signed)
Safety sitter will be on the floor at 1400. Currently NT are rounding on patient and answering bed alarm.

## 2015-06-04 NOTE — Care Management Obs Status (Signed)
MEDICARE OBSERVATION STATUS NOTIFICATION   Patient Details  Name: Darrell Mcconnell MRN: [id number] Date of Birth: [birth year]   Medicare Observation Status Notification Given:  Yes    Marily MemosLisa M Marylan Glore, RN 06/04/2015, 8:21 AM

## 2015-06-04 NOTE — Progress Notes (Signed)
Spoke with Lynette from family care home and they are not available to come and get the patient until 5PM this evening. Patient is very impulsive at this time and charge nurse has been notified that a sitter is needed until the patient leaves. Patient has gotten up multiple times since returning from test. Working on getting a safety sitter now. 

## 2015-06-04 NOTE — Clinical Social Work Note (Signed)
Clinical Social Work Assessment  Patient Details  Name: Darrell Mcconnell MRN: [id number] Date of Birth: 1936-02-18  Date of referral:  06/04/15               Reason for consult:  Facility Placement, Other (Comment Required) (From [facility]. )                Permission sought to share information with:  Facility Art therapist granted to share information::  Yes, Verbal Permission Granted  Name::        Agency::     Relationship::     Contact Information:     Housing/Transportation Living arrangements for the past 2 months:  [place] of Information:  Patient, Adult Children, Facility Patient Interpreter Needed:  None Criminal Activity/Legal Involvement Pertinent to Current Situation/Hospitalization:  No - Comment as needed Significant Relationships:  Adult Children Lives with:  Facility Resident Do you feel safe going back to the place where you live?  Yes Need for family participation in patient care:  Yes (Comment)  Care giving concerns:  Patient is a resident at [facility].    Social Worker assessment / plan:  Holiday representative (Beckham) received consult that patient is from [facility] and is ready for D/C today. CSW contacted Engineer, mining at [employer]. Per Willette Cluster patient has been a resident since September 2016 and is able to return today. Per Willette Cluster patient walks with a walker and is on chronic oxygen through [place]. Per Willette Cluster she will pick patient up around 5 pm this afternoon. CSW met with patient to discuss D/C plan. Patient is agreeable to return to [facility]. CSW contacted patient's son Coralyn Pear and made him aware of above. Son is in agreement with plan. RN aware of above and D/C Packet on chart. EMS form complete if needed. Please reconsult if future social work needs arise. CSW signing off.   Employment status:  Disabled (Comment on whether or not currently receiving Disability),  Retired Forensic scientist:  Medicaid In [place], [employer] PT Recommendations:  Not assessed at this time Information / Referral to community resources:  Other (Comment Required) (Patient will return to ALF )  Patient/Family's Response to care:  Patient and his son Coralyn Pear are in agreement for patient to return to [facility].   Patient/Family's Understanding of and Emotional Response to Diagnosis, Current Treatment, and Prognosis:  Patient was pleasant and thanked CSW for visit.   Emotional Assessment Appearance:  Appears stated age Attitude/Demeanor/Rapport:    Affect (typically observed):  Pleasant Orientation:  Oriented to Self, Oriented to Place, Oriented to  Time Alcohol / Substance use:  Not Applicable Psych involvement (Current and /or in the community):  No (Comment)  Discharge Needs  Concerns to be addressed:  Discharge Planning Concerns Readmission within the last 30 days:  No Current discharge risk:  None Barriers to Discharge:  No Barriers Identified   Loralyn Freshwater, LCSW 06/04/2015, 3:41 PM

## 2015-06-04 NOTE — Progress Notes (Signed)
Spoke to Dr. Amado CoeGouru about stress test not being completed. MD stated to offer patient to see a psychiatrist while here due to his agitation during the test. Patient denied being seen by a psychiatrist and he would like to go home now. Will call family home to arrange discharge.

## 2015-06-04 NOTE — Progress Notes (Signed)
Confirmed with nuclear medicine that patient can take all morning medications except metoprolol this AM. Consent has been signed and patient will go down any time for stress test.

## 2015-06-04 NOTE — Discharge Instructions (Signed)
Activity as tolerated Cardiac diet Follow-up with primary care physician in 3 -5 days Follow-up with primary cardiology in a week for outpatient stress test Follow-up with psychiatry as an outpatient for anxiety-PCP to refer the patient

## 2015-06-11 IMAGING — CR DG SHOULDER 3+V*R*
1 series · 3 of 3 positions shown · non-contrast
Comparison: None.

CLINICAL DATA: Right shoulder pain 3 weeks, no injury

EXAM:
DG SHOULDER 3+ VIEWS RIGHT

[Series 1: kdxr shoulder right complete · 0.14mm/px · 3 of 3 slices shown]
[im 1/3]
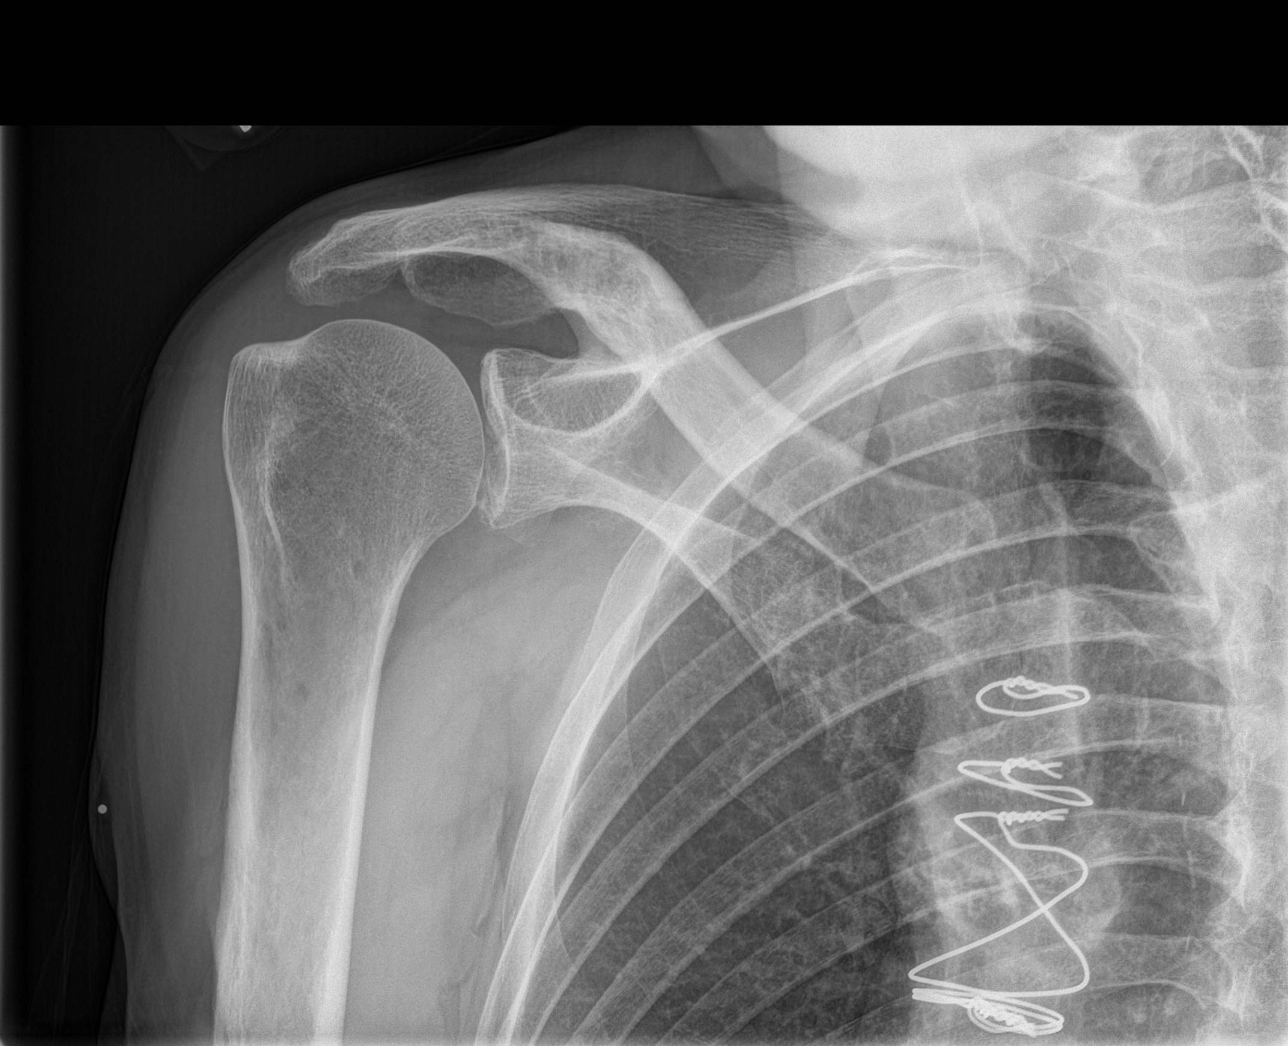
[im 2/3]
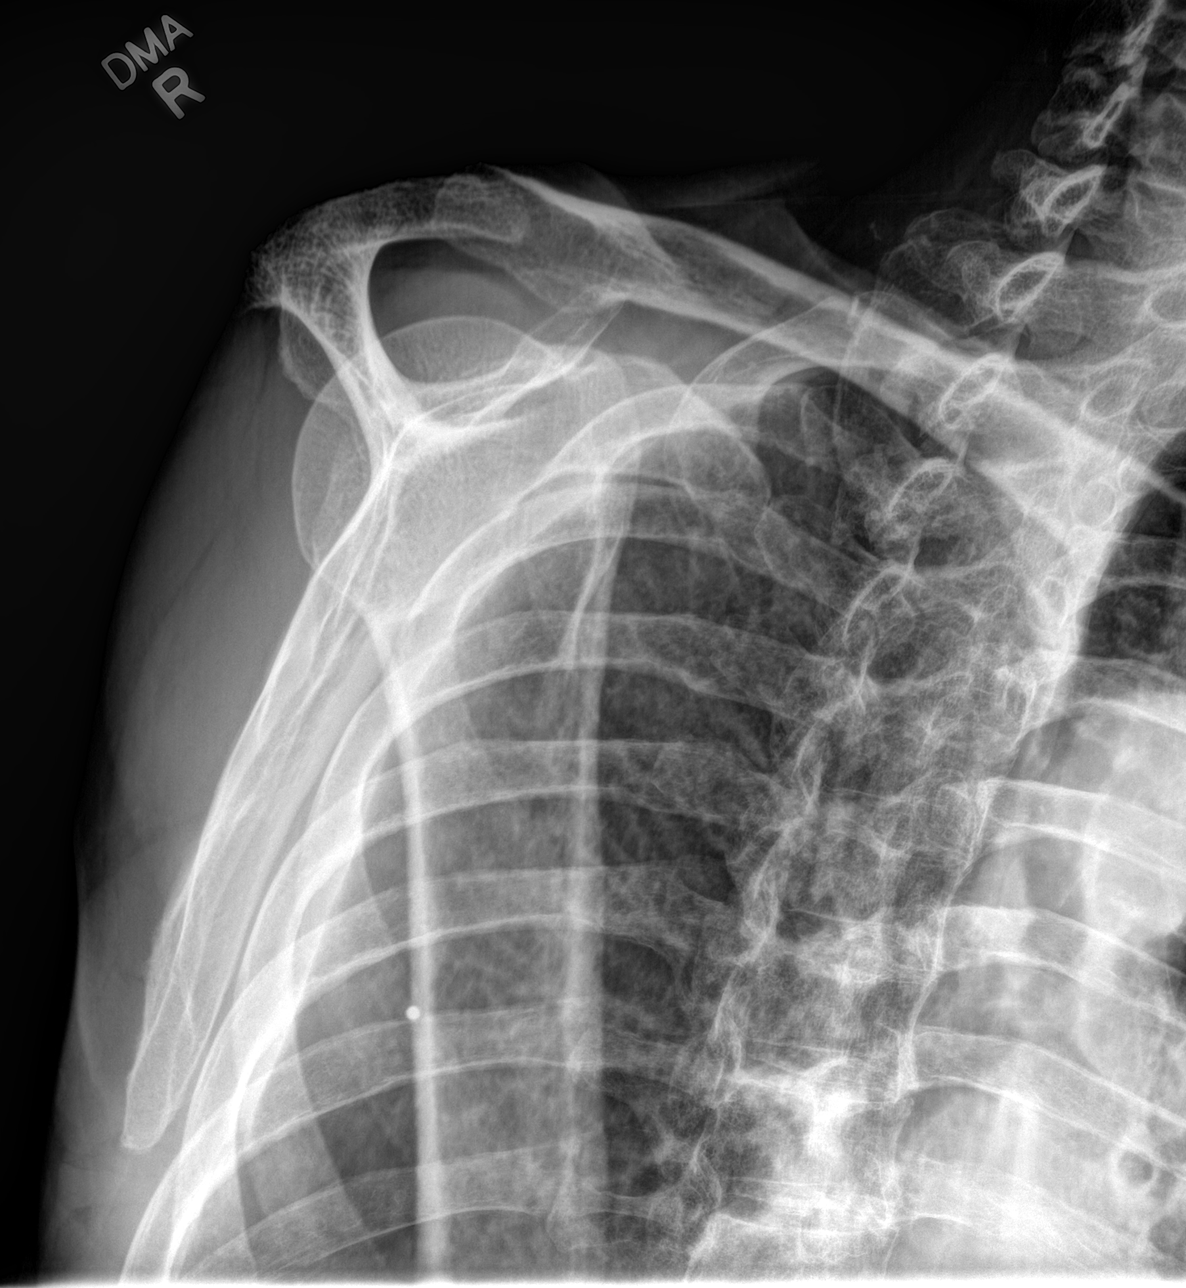
[im 3/3]
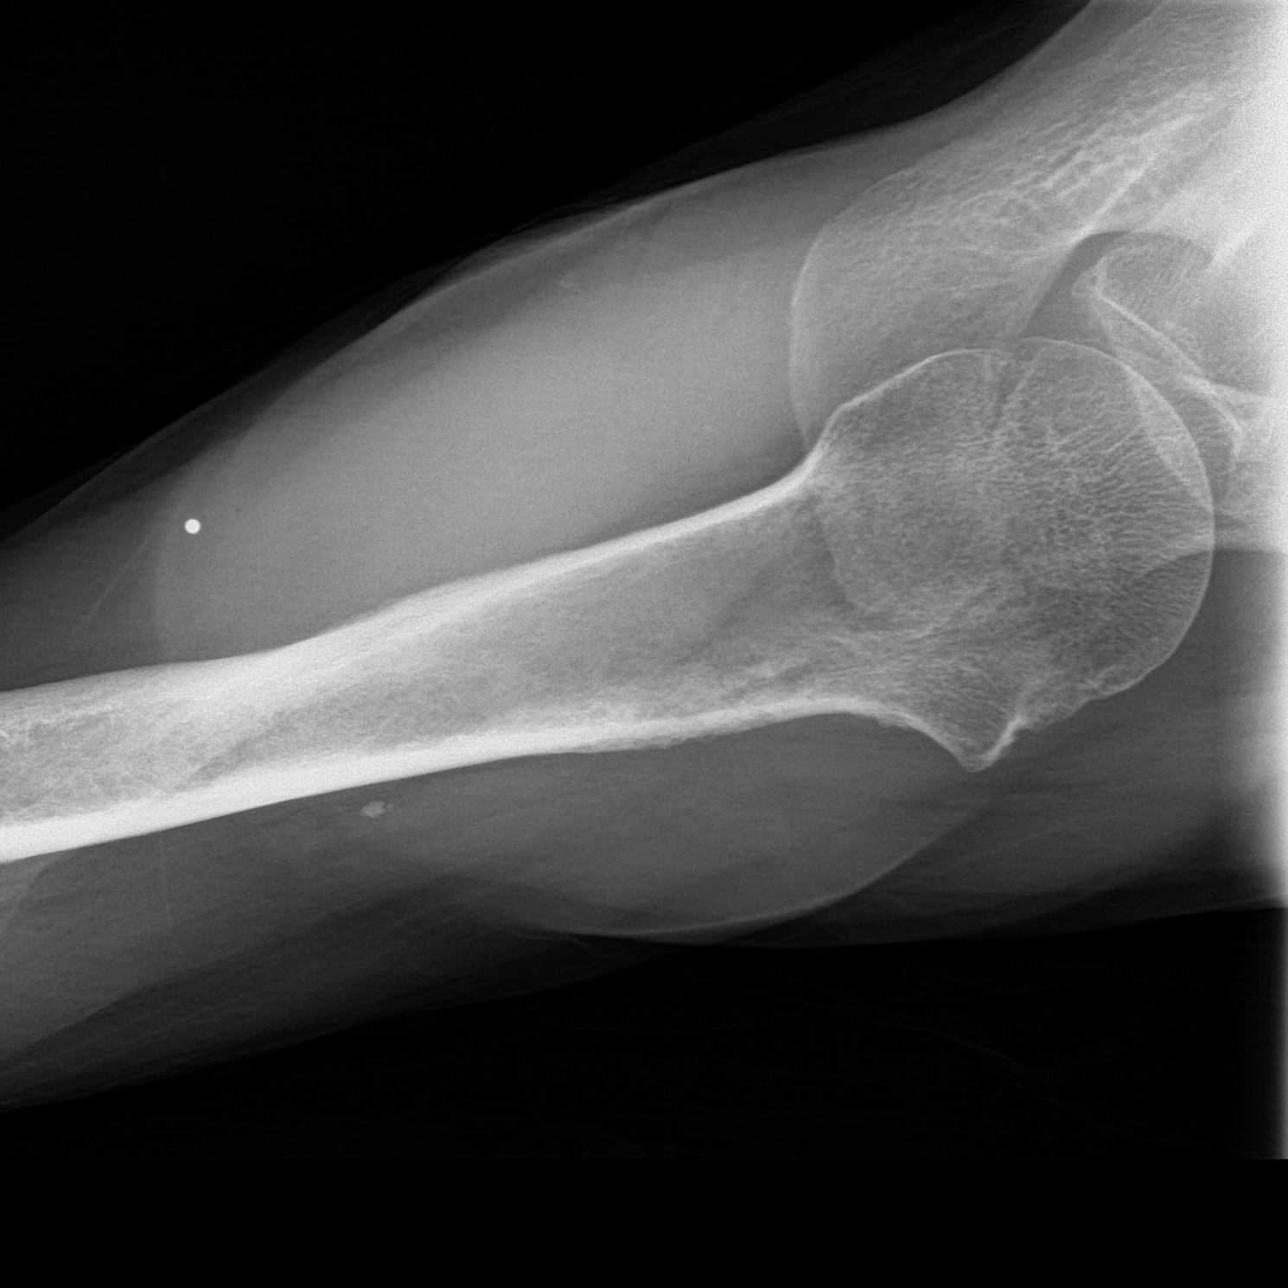

[3 of 3 positions shown; findings below may reference images not displayed]

FINDINGS: BB marks a palpable abnormality at the insertion of the deltoid
laterally. No bony abnormality in this area. No soft tissue mass or
calcification. Shoulder joint is normal.
IMPRESSION: Negative.

## 2015-06-25 DIAGNOSIS — H401133 Primary open-angle glaucoma, bilateral, severe stage: Secondary | ICD-10-CM | POA: Diagnosis not present

## 2015-07-04 DIAGNOSIS — J449 Chronic obstructive pulmonary disease, unspecified: Secondary | ICD-10-CM | POA: Diagnosis not present

## 2015-07-15 DIAGNOSIS — Z0001 Encounter for general adult medical examination with abnormal findings: Secondary | ICD-10-CM | POA: Diagnosis not present

## 2015-07-15 DIAGNOSIS — J9611 Chronic respiratory failure with hypoxia: Secondary | ICD-10-CM | POA: Diagnosis not present

## 2015-07-15 DIAGNOSIS — I714 Abdominal aortic aneurysm, without rupture: Secondary | ICD-10-CM | POA: Diagnosis not present

## 2015-07-15 DIAGNOSIS — J449 Chronic obstructive pulmonary disease, unspecified: Secondary | ICD-10-CM | POA: Diagnosis not present

## 2015-07-15 DIAGNOSIS — I482 Chronic atrial fibrillation: Secondary | ICD-10-CM | POA: Diagnosis not present

## 2015-08-04 DIAGNOSIS — J449 Chronic obstructive pulmonary disease, unspecified: Secondary | ICD-10-CM | POA: Diagnosis not present

## 2015-08-26 ENCOUNTER — Emergency Department: Payer: Medicare Other

## 2015-08-26 ENCOUNTER — Inpatient Hospital Stay
Admission: EM | Admit: 2015-08-26 | Discharge: 2015-08-29 | DRG: 871 | Disposition: A | Discharge status: Home Health | Payer: Medicare Other | Attending: Internal Medicine | Admitting: Internal Medicine

## 2015-08-26 ENCOUNTER — Encounter: Payer: Self-pay | Admitting: Emergency Medicine

## 2015-08-26 DIAGNOSIS — Z8249 Family history of ischemic heart disease and other diseases of the circulatory system: Secondary | ICD-10-CM | POA: Diagnosis not present

## 2015-08-26 DIAGNOSIS — R509 Fever, unspecified: Secondary | ICD-10-CM

## 2015-08-26 DIAGNOSIS — R000 Tachycardia, unspecified: Secondary | ICD-10-CM | POA: Diagnosis present

## 2015-08-26 DIAGNOSIS — J9621 Acute and chronic respiratory failure with hypoxia: Secondary | ICD-10-CM | POA: Diagnosis present

## 2015-08-26 DIAGNOSIS — A419 Sepsis, unspecified organism: Secondary | ICD-10-CM | POA: Diagnosis not present

## 2015-08-26 DIAGNOSIS — Z87891 Personal history of nicotine dependence: Secondary | ICD-10-CM

## 2015-08-26 DIAGNOSIS — J441 Chronic obstructive pulmonary disease with (acute) exacerbation: Secondary | ICD-10-CM | POA: Diagnosis present

## 2015-08-26 DIAGNOSIS — I110 Hypertensive heart disease with heart failure: Secondary | ICD-10-CM | POA: Diagnosis present

## 2015-08-26 DIAGNOSIS — I509 Heart failure, unspecified: Secondary | ICD-10-CM | POA: Diagnosis not present

## 2015-08-26 DIAGNOSIS — Z79899 Other long term (current) drug therapy: Secondary | ICD-10-CM

## 2015-08-26 DIAGNOSIS — F419 Anxiety disorder, unspecified: Secondary | ICD-10-CM | POA: Diagnosis present

## 2015-08-26 DIAGNOSIS — E039 Hypothyroidism, unspecified: Secondary | ICD-10-CM | POA: Diagnosis present

## 2015-08-26 DIAGNOSIS — J440 Chronic obstructive pulmonary disease with acute lower respiratory infection: Secondary | ICD-10-CM | POA: Diagnosis not present

## 2015-08-26 DIAGNOSIS — L89151 Pressure ulcer of sacral region, stage 1: Secondary | ICD-10-CM | POA: Diagnosis not present

## 2015-08-26 DIAGNOSIS — Z809 Family history of malignant neoplasm, unspecified: Secondary | ICD-10-CM

## 2015-08-26 DIAGNOSIS — J189 Pneumonia, unspecified organism: Secondary | ICD-10-CM | POA: Diagnosis not present

## 2015-08-26 DIAGNOSIS — L8990 Pressure ulcer of unspecified site, unspecified stage: Secondary | ICD-10-CM | POA: Insufficient documentation

## 2015-08-26 DIAGNOSIS — F0390 Unspecified dementia without behavioral disturbance: Secondary | ICD-10-CM | POA: Diagnosis present

## 2015-08-26 DIAGNOSIS — R531 Weakness: Secondary | ICD-10-CM | POA: Diagnosis not present

## 2015-08-26 DIAGNOSIS — R0902 Hypoxemia: Secondary | ICD-10-CM | POA: Diagnosis not present

## 2015-08-26 DIAGNOSIS — J449 Chronic obstructive pulmonary disease, unspecified: Secondary | ICD-10-CM | POA: Diagnosis not present

## 2015-08-26 DIAGNOSIS — I482 Chronic atrial fibrillation, unspecified: Secondary | ICD-10-CM

## 2015-08-26 DIAGNOSIS — J90 Pleural effusion, not elsewhere classified: Secondary | ICD-10-CM | POA: Diagnosis not present

## 2015-08-26 DIAGNOSIS — I959 Hypotension, unspecified: Secondary | ICD-10-CM | POA: Diagnosis present

## 2015-08-26 DIAGNOSIS — K219 Gastro-esophageal reflux disease without esophagitis: Secondary | ICD-10-CM | POA: Diagnosis not present

## 2015-08-26 DIAGNOSIS — Z951 Presence of aortocoronary bypass graft: Secondary | ICD-10-CM | POA: Diagnosis not present

## 2015-08-26 DIAGNOSIS — Z9981 Dependence on supplemental oxygen: Secondary | ICD-10-CM | POA: Diagnosis not present

## 2015-08-26 DIAGNOSIS — I5022 Chronic systolic (congestive) heart failure: Secondary | ICD-10-CM

## 2015-08-26 DIAGNOSIS — Z8679 Personal history of other diseases of the circulatory system: Secondary | ICD-10-CM

## 2015-08-26 DIAGNOSIS — Z66 Do not resuscitate: Secondary | ICD-10-CM | POA: Diagnosis not present

## 2015-08-26 DIAGNOSIS — I5023 Acute on chronic systolic (congestive) heart failure: Secondary | ICD-10-CM | POA: Diagnosis present

## 2015-08-26 DIAGNOSIS — Z9889 Other specified postprocedural states: Secondary | ICD-10-CM | POA: Diagnosis not present

## 2015-08-26 DIAGNOSIS — Z7982 Long term (current) use of aspirin: Secondary | ICD-10-CM | POA: Diagnosis not present

## 2015-08-26 DIAGNOSIS — R6889 Other general symptoms and signs: Secondary | ICD-10-CM | POA: Diagnosis not present

## 2015-08-26 DIAGNOSIS — R079 Chest pain, unspecified: Secondary | ICD-10-CM | POA: Diagnosis not present

## 2015-08-26 LAB — CBC WITH DIFFERENTIAL/PLATELET
BASOS ABS: 0 10*3/uL (ref 0–0.1)
BASOS PCT: 0 %
Eosinophils Absolute: 0.3 10*3/uL (ref 0–0.7)
Eosinophils Relative: 3 %
HEMATOCRIT: 33.2 % — AB (ref 40.0–52.0)
HEMOGLOBIN: 11 g/dL — AB (ref 13.0–18.0)
Lymphocytes Relative: 7 %
Lymphs Abs: 0.7 10*3/uL — ABNORMAL LOW (ref 1.0–3.6)
MCH: 27.3 pg (ref 26.0–34.0)
MCHC: 33.2 g/dL (ref 32.0–36.0)
MCV: 82.2 fL (ref 80.0–100.0)
MONOS PCT: 4 %
Monocytes Absolute: 0.4 10*3/uL (ref 0.2–1.0)
NEUTROS ABS: 8.5 10*3/uL — AB (ref 1.4–6.5)
NEUTROS PCT: 86 %
Platelets: 125 10*3/uL — ABNORMAL LOW (ref 150–440)
RBC: 4.03 MIL/uL — AB (ref 4.40–5.90)
RDW: 17.1 % — ABNORMAL HIGH (ref 11.5–14.5)
WBC: 9.9 10*3/uL (ref 3.8–10.6)

## 2015-08-26 LAB — CREATININE, SERUM: Creatinine, Ser: 0.92 mg/dL (ref 0.61–1.24)

## 2015-08-26 LAB — URINALYSIS COMPLETE WITH MICROSCOPIC (ARMC ONLY)
BILIRUBIN URINE: NEGATIVE
Bacteria, UA: NONE SEEN
GLUCOSE, UA: NEGATIVE mg/dL
KETONES UR: NEGATIVE mg/dL
Leukocytes, UA: NEGATIVE
Nitrite: NEGATIVE
Protein, ur: NEGATIVE mg/dL
Specific Gravity, Urine: 1.013 (ref 1.005–1.030)
pH: 6 (ref 5.0–8.0)

## 2015-08-26 LAB — CBC
HEMATOCRIT: 31.6 % — AB (ref 40.0–52.0)
HEMOGLOBIN: 10.5 g/dL — AB (ref 13.0–18.0)
MCH: 27.2 pg (ref 26.0–34.0)
MCHC: 33.2 g/dL (ref 32.0–36.0)
MCV: 82 fL (ref 80.0–100.0)
Platelets: 107 10*3/uL — ABNORMAL LOW (ref 150–440)
RBC: 3.85 MIL/uL — AB (ref 4.40–5.90)
RDW: 17.4 % — AB (ref 11.5–14.5)
WBC: 12 10*3/uL — AB (ref 3.8–10.6)

## 2015-08-26 LAB — LACTIC ACID, PLASMA
LACTIC ACID, VENOUS: 1.7 mmol/L (ref 0.5–1.9)
LACTIC ACID, VENOUS: 1 mmol/L (ref 0.5–1.9)

## 2015-08-26 LAB — COMPREHENSIVE METABOLIC PANEL
ALK PHOS: 72 U/L (ref 38–126)
ALT: 11 U/L — ABNORMAL LOW (ref 17–63)
ANION GAP: 5 (ref 5–15)
AST: 19 U/L (ref 15–41)
Albumin: 3.3 g/dL — ABNORMAL LOW (ref 3.5–5.0)
BUN: 19 mg/dL (ref 6–20)
CALCIUM: 8.4 mg/dL — AB (ref 8.9–10.3)
CO2: 33 mmol/L — AB (ref 22–32)
Chloride: 105 mmol/L (ref 101–111)
Creatinine, Ser: 0.82 mg/dL (ref 0.61–1.24)
GFR calc non Af Amer: >60 mL/min (ref >60)
Glucose, Bld: 90 mg/dL (ref 65–99)
Potassium: 3.7 mmol/L (ref 3.5–5.1)
SODIUM: 143 mmol/L (ref 135–145)
TOTAL PROTEIN: 6.4 g/dL — AB (ref 6.5–8.1)
Total Bilirubin: 1 mg/dL (ref 0.3–1.2)

## 2015-08-26 LAB — TROPONIN I
TROPONIN I: 0.03 ng/mL — AB (ref <0.03)
TROPONIN I: 0.04 ng/mL — AB (ref <0.03)
Troponin I: 0.05 ng/mL (ref <0.03)

## 2015-08-26 LAB — MRSA PCR SCREENING: MRSA by PCR: NEGATIVE

## 2015-08-26 LAB — BRAIN NATRIURETIC PEPTIDE: B NATRIURETIC PEPTIDE 5: 282 pg/mL — AB (ref 0.0–100.0)

## 2015-08-26 LAB — STREP PNEUMONIAE URINARY ANTIGEN: STREP PNEUMO URINARY ANTIGEN: NEGATIVE

## 2015-08-26 MED ORDER — METHYLPREDNISOLONE SODIUM SUCC 125 MG IJ SOLR
60.0000 mg | Freq: Two times a day (BID) | INTRAMUSCULAR | Status: DC
  Administered 2015-08-26 – 2015-08-28 (×5): 60 mg via INTRAVENOUS
  Filled 2015-08-26 (×5): qty 2

## 2015-08-26 MED ORDER — DOCUSATE SODIUM 100 MG PO CAPS
100.0000 mg | ORAL_CAPSULE | Freq: Two times a day (BID) | ORAL | Status: DC
  Administered 2015-08-26 – 2015-08-29 (×7): 100 mg via ORAL
  Filled 2015-08-26 (×7): qty 1

## 2015-08-26 MED ORDER — ALPRAZOLAM 0.25 MG PO TABS
0.2500 mg | ORAL_TABLET | Freq: Three times a day (TID) | ORAL | Status: DC | PRN
  Administered 2015-08-26 – 2015-08-29 (×6): 0.25 mg via ORAL
  Filled 2015-08-26 (×6): qty 1

## 2015-08-26 MED ORDER — DIGOXIN 125 MCG PO TABS
0.1250 mg | ORAL_TABLET | Freq: Every day | ORAL | Status: DC
  Filled 2015-08-26: qty 1

## 2015-08-26 MED ORDER — SODIUM CHLORIDE 0.9 % IV BOLUS (SEPSIS)
1000.0000 mL | Freq: Once | INTRAVENOUS | Status: AC
  Administered 2015-08-26: 1000 mL via INTRAVENOUS

## 2015-08-26 MED ORDER — ONDANSETRON HCL 4 MG/2ML IJ SOLN
4.0000 mg | Freq: Four times a day (QID) | INTRAMUSCULAR | Status: DC | PRN
  Administered 2015-08-27 – 2015-08-28 (×2): 4 mg via INTRAVENOUS
  Filled 2015-08-26 (×2): qty 2

## 2015-08-26 MED ORDER — ACETAMINOPHEN 325 MG PO TABS
650.0000 mg | ORAL_TABLET | Freq: Four times a day (QID) | ORAL | Status: DC | PRN
  Administered 2015-08-26 – 2015-08-29 (×3): 650 mg via ORAL
  Filled 2015-08-26 (×3): qty 2

## 2015-08-26 MED ORDER — ENOXAPARIN SODIUM 40 MG/0.4ML ~~LOC~~ SOLN
40.0000 mg | SUBCUTANEOUS | Status: DC
  Administered 2015-08-26 – 2015-08-28 (×3): 40 mg via SUBCUTANEOUS
  Filled 2015-08-26 (×3): qty 0.4

## 2015-08-26 MED ORDER — SODIUM CHLORIDE 0.9 % IV SOLN
INTRAVENOUS | Status: DC
  Administered 2015-08-26 – 2015-08-28 (×3): via INTRAVENOUS

## 2015-08-26 MED ORDER — CEFEPIME HCL 2 G IJ SOLR
2.0000 g | Freq: Once | INTRAMUSCULAR | Status: AC
  Administered 2015-08-26: 2 g via INTRAVENOUS
  Filled 2015-08-26: qty 2

## 2015-08-26 MED ORDER — ONDANSETRON HCL 4 MG/2ML IJ SOLN
4.0000 mg | Freq: Once | INTRAMUSCULAR | Status: AC
Start: 2015-08-26 — End: 2015-08-26
  Administered 2015-08-26: 4 mg via INTRAVENOUS

## 2015-08-26 MED ORDER — QUETIAPINE FUMARATE 25 MG PO TABS
50.0000 mg | ORAL_TABLET | Freq: Every day | ORAL | Status: DC
  Administered 2015-08-26 – 2015-08-28 (×3): 50 mg via ORAL
  Filled 2015-08-26 (×4): qty 2

## 2015-08-26 MED ORDER — CHOLECALCIFEROL 10 MCG (400 UNIT) PO TABS
400.0000 [IU] | ORAL_TABLET | Freq: Every day | ORAL | Status: DC
  Administered 2015-08-26 – 2015-08-29 (×4): 400 [IU] via ORAL
  Filled 2015-08-26 (×4): qty 1

## 2015-08-26 MED ORDER — MELOXICAM 7.5 MG PO TABS
7.5000 mg | ORAL_TABLET | Freq: Every day | ORAL | Status: DC
  Administered 2015-08-26 – 2015-08-29 (×4): 7.5 mg via ORAL
  Filled 2015-08-26 (×4): qty 1

## 2015-08-26 MED ORDER — POLYETHYLENE GLYCOL 3350 17 G PO PACK: 17.0000 g | PACK | Freq: Every day | ORAL | Status: DC | PRN

## 2015-08-26 MED ORDER — ACETAMINOPHEN 325 MG PO TABS
ORAL_TABLET | ORAL | Status: AC
Start: 2015-08-26 — End: 2015-08-26
  Administered 2015-08-26: 325 mg via ORAL
  Filled 2015-08-26: qty 1

## 2015-08-26 MED ORDER — MIRTAZAPINE 15 MG PO TABS
30.0000 mg | ORAL_TABLET | Freq: Every day | ORAL | Status: DC
  Administered 2015-08-26 – 2015-08-28 (×3): 30 mg via ORAL
  Filled 2015-08-26 (×3): qty 2

## 2015-08-26 MED ORDER — LISINOPRIL 5 MG PO TABS
2.5000 mg | ORAL_TABLET | Freq: Every day | ORAL | Status: DC
  Filled 2015-08-26: qty 0.5

## 2015-08-26 MED ORDER — QUETIAPINE FUMARATE 25 MG PO TABS
25.0000 mg | ORAL_TABLET | Freq: Two times a day (BID) | ORAL | Status: DC
  Administered 2015-08-26 – 2015-08-29 (×7): 25 mg via ORAL
  Filled 2015-08-26 (×6): qty 1

## 2015-08-26 MED ORDER — VANCOMYCIN HCL IN DEXTROSE 1-5 GM/200ML-% IV SOLN
1000.0000 mg | Freq: Once | INTRAVENOUS | Status: AC
  Administered 2015-08-26: 1000 mg via INTRAVENOUS
  Filled 2015-08-26: qty 200

## 2015-08-26 MED ORDER — ONDANSETRON HCL 4 MG PO TABS: 4.0000 mg | ORAL_TABLET | Freq: Two times a day (BID) | ORAL | Status: DC | PRN

## 2015-08-26 MED ORDER — FUROSEMIDE 40 MG PO TABS
40.0000 mg | ORAL_TABLET | ORAL | Status: DC
  Administered 2015-08-27: 40 mg via ORAL
  Filled 2015-08-26 (×2): qty 1

## 2015-08-26 MED ORDER — BRIMONIDINE TARTRATE 0.2 % OP SOLN
1.0000 [drp] | Freq: Two times a day (BID) | OPHTHALMIC | Status: DC
  Administered 2015-08-26 – 2015-08-29 (×6): 1 [drp] via OPHTHALMIC
  Filled 2015-08-26 (×2): qty 5

## 2015-08-26 MED ORDER — LATANOPROST 0.005 % OP SOLN
1.0000 [drp] | Freq: Every day | OPHTHALMIC | Status: DC
  Administered 2015-08-26 – 2015-08-28 (×3): 1 [drp] via OPHTHALMIC
  Filled 2015-08-26: qty 2.5

## 2015-08-26 MED ORDER — ALBUTEROL SULFATE HFA 108 (90 BASE) MCG/ACT IN AERS
2.0000 | INHALATION_SPRAY | RESPIRATORY_TRACT | Status: DC
Start: 2015-08-26 — End: 2015-08-26

## 2015-08-26 MED ORDER — SIMETHICONE 80 MG PO CHEW: 80.0000 mg | CHEWABLE_TABLET | ORAL | Status: DC | PRN

## 2015-08-26 MED ORDER — ALBUTEROL SULFATE (2.5 MG/3ML) 0.083% IN NEBU
2.5000 mg | INHALATION_SOLUTION | Freq: Four times a day (QID) | RESPIRATORY_TRACT | Status: DC | PRN
  Administered 2015-08-28: 2.5 mg via RESPIRATORY_TRACT
  Filled 2015-08-26 (×2): qty 3

## 2015-08-26 MED ORDER — DEXTROSE 5 % IV SOLN
1.0000 g | INTRAVENOUS | Status: DC
  Administered 2015-08-26 – 2015-08-29 (×4): 1 g via INTRAVENOUS
  Filled 2015-08-26 (×5): qty 10

## 2015-08-26 MED ORDER — ACETAMINOPHEN 325 MG PO TABS
325.0000 mg | ORAL_TABLET | Freq: Once | ORAL | Status: AC
  Administered 2015-08-26: 325 mg via ORAL
  Filled 2015-08-26: qty 1

## 2015-08-26 MED ORDER — PANTOPRAZOLE SODIUM 40 MG PO TBEC
40.0000 mg | DELAYED_RELEASE_TABLET | Freq: Every day | ORAL | Status: DC
  Administered 2015-08-26 – 2015-08-29 (×4): 40 mg via ORAL
  Filled 2015-08-26 (×4): qty 1

## 2015-08-26 MED ORDER — ASPIRIN EC 81 MG PO TBEC
81.0000 mg | DELAYED_RELEASE_TABLET | Freq: Every evening | ORAL | Status: DC
  Administered 2015-08-26 – 2015-08-28 (×3): 81 mg via ORAL
  Filled 2015-08-26 (×3): qty 1

## 2015-08-26 MED ORDER — ONDANSETRON HCL 4 MG PO TABS: 4.0000 mg | ORAL_TABLET | Freq: Four times a day (QID) | ORAL | Status: DC | PRN

## 2015-08-26 MED ORDER — BRIMONIDINE TARTRATE-TIMOLOL 0.2-0.5 % OP SOLN: 1.0000 [drp] | Freq: Two times a day (BID) | OPHTHALMIC | Status: DC

## 2015-08-26 MED ORDER — SODIUM CHLORIDE 0.9 % IV BOLUS (SEPSIS)
250.0000 mL | Freq: Once | INTRAVENOUS | Status: AC
  Administered 2015-08-26: 250 mL via INTRAVENOUS

## 2015-08-26 MED ORDER — DORZOLAMIDE HCL 2 % OP SOLN
1.0000 [drp] | Freq: Two times a day (BID) | OPHTHALMIC | Status: DC
  Administered 2015-08-26 – 2015-08-29 (×6): 1 [drp] via OPHTHALMIC
  Filled 2015-08-26 (×2): qty 10

## 2015-08-26 MED ORDER — PRENATAL MULTIVITAMIN CH
1.0000 | ORAL_TABLET | Freq: Every day | ORAL | Status: DC
  Administered 2015-08-27 – 2015-08-29 (×3): 1 via ORAL
  Filled 2015-08-26 (×7): qty 1

## 2015-08-26 MED ORDER — DICYCLOMINE HCL 20 MG PO TABS: 20.0000 mg | ORAL_TABLET | Freq: Three times a day (TID) | ORAL | Status: DC | PRN

## 2015-08-26 MED ORDER — FINASTERIDE 5 MG PO TABS
5.0000 mg | ORAL_TABLET | Freq: Every day | ORAL | Status: DC
  Administered 2015-08-26 – 2015-08-29 (×4): 5 mg via ORAL
  Filled 2015-08-26 (×4): qty 1

## 2015-08-26 MED ORDER — ACETAMINOPHEN 650 MG RE SUPP
650.0000 mg | Freq: Four times a day (QID) | RECTAL | Status: DC | PRN
Start: 2015-08-26 — End: 2015-08-29

## 2015-08-26 MED ORDER — DOCUSATE SODIUM 100 MG PO CAPS: 100.0000 mg | ORAL_CAPSULE | Freq: Two times a day (BID) | ORAL | Status: DC

## 2015-08-26 MED ORDER — MOMETASONE FURO-FORMOTEROL FUM 200-5 MCG/ACT IN AERO
2.0000 | INHALATION_SPRAY | Freq: Two times a day (BID) | RESPIRATORY_TRACT | Status: DC
  Administered 2015-08-26 – 2015-08-29 (×7): 2 via RESPIRATORY_TRACT
  Filled 2015-08-26: qty 8.8

## 2015-08-26 MED ORDER — ESCITALOPRAM OXALATE 10 MG PO TABS
15.0000 mg | ORAL_TABLET | Freq: Every day | ORAL | Status: DC
  Administered 2015-08-26 – 2015-08-29 (×4): 15 mg via ORAL
  Filled 2015-08-26 (×4): qty 2

## 2015-08-26 MED ORDER — MORPHINE SULFATE (PF) 2 MG/ML IV SOLN
1.0000 mg | Freq: Once | INTRAVENOUS | Status: AC
  Administered 2015-08-26: 1 mg via INTRAVENOUS
  Filled 2015-08-26: qty 1

## 2015-08-26 MED ORDER — ONDANSETRON HCL 4 MG/2ML IJ SOLN
INTRAMUSCULAR | Status: AC
  Administered 2015-08-26: 4 mg via INTRAVENOUS
  Filled 2015-08-26: qty 2

## 2015-08-26 MED ORDER — TIMOLOL MALEATE 0.5 % OP SOLN
1.0000 [drp] | Freq: Two times a day (BID) | OPHTHALMIC | Status: DC
  Administered 2015-08-26 – 2015-08-29 (×6): 1 [drp] via OPHTHALMIC
  Filled 2015-08-26: qty 5

## 2015-08-26 MED ORDER — LEVOTHYROXINE SODIUM 25 MCG PO TABS
25.0000 ug | ORAL_TABLET | ORAL | Status: DC
  Administered 2015-08-26 – 2015-08-29 (×4): 25 ug via ORAL
  Filled 2015-08-26 (×4): qty 1

## 2015-08-26 MED ORDER — METOPROLOL TARTRATE 25 MG PO TABS
12.5000 mg | ORAL_TABLET | Freq: Every day | ORAL | Status: DC
  Filled 2015-08-26: qty 1

## 2015-08-26 MED ORDER — AZITHROMYCIN 500 MG PO TABS
500.0000 mg | ORAL_TABLET | ORAL | Status: DC
  Administered 2015-08-26 – 2015-08-29 (×4): 500 mg via ORAL
  Filled 2015-08-26 (×4): qty 1

## 2015-08-26 NOTE — Progress Notes (Signed)
MD notified of troponins trending up.  No changes made at this time

## 2015-08-26 NOTE — ED Notes (Addendum)
Pt has continued to state "I am going to die." This nurse offered reassurance and prayer. Alen BleacherChaplain Danny also spent time at bedside praying with patient. Pt anxious but responsive to therapeutic touch and words of encouragement. Pt also occasionally sticks finger into mouth, presumably to make himself vomit, but has not yet vomited. Denies need for further nausea medication.

## 2015-08-26 NOTE — ED Notes (Signed)
Patient brought in by ems form [facility]. Ems called out for chest pain. Per ems patient with fever of 102.7. Patient's o2 sats 82% on 2l. Patient was placed on 5l by ems with sats of 89%. Patient complained of nausea in route and was given zofran 4mg   Iv. Patient was given 650 mg tylenol and 500 ml by ems.

## 2015-08-26 NOTE — Care Management Important Message (Signed)
Important Message  Patient Details  Name: Revonda StandardClyde Frankenfield MRN: [id number] Date of Birth: [birth year]   Medicare Important Message Given:  Yes    Collie SiadAngela Sahily Biddle, RN 08/26/2015, 10:49 AM

## 2015-08-26 NOTE — Progress Notes (Signed)
   08/26/15 0800  Clinical Encounter Type  Visited With Patient  Visit Type ED;Initial  Referral From Nurse  Consult/Referral To Chaplain  Spiritual Encounters  Spiritual Needs Emotional;Prayer  Stress Factors  Patient Stress Factors Exhausted;Health changes;Major life changes  Visited patient who appeared very anxious. Often stating, "I going to die"and "I want to go home." He responded well to reassurance that Nurse Corrie DandyMary was present and caring for him. I will continue to follow up after morning report in the chapel. Norm Salthap Robyn Galati G. Nilo Fallin, ext. [phone]

## 2015-08-26 NOTE — Progress Notes (Signed)
MD notified of low BP.  Some of am meds held (lasix, lisinopril, lopressor, digoxin).  No other changes made at this time

## 2015-08-26 NOTE — ED Provider Notes (Signed)
[facility] Emergency Department Provider Note   ____________________________________________  Time seen: Approximately 6:52 AM  I have reviewed the triage vital signs and the nursing notes.   HISTORY  Chief Complaint Chest Pain; Fever; and Shortness of Breath  Limited by dementia  HPI Darrell Mcconnell is a 80 y.o. male [facility] the ED from [facility] home [facility] EMS with a chief complaint of respiratory distress. EMS reports patient with fever of 102.23F; 650 mg Tylenol given prior to arrival. Room air saturations 82% on patient's baseline 2 L. Increased to 89% on 5 L nasal cannula. Patient complains of chills, nonproductive cough, chest tightness and shortness of breath.Denies abdominal pain, vomiting, diarrhea. Denies recent travel or trauma. Nothing makes his symptoms better or worse.   Past Medical History  Diagnosis Date  . Chronic systolic CHF (congestive heart failure) (HCC)   . COPD (chronic obstructive pulmonary disease) (HCC)   . Aorta aneurysm (HCC)   . Asthma   . Hypertension   . Anxiety   . A-fib (HCC)   . Depression   . Hypothyroidism   . GERD (gastroesophageal reflux disease)   . Dementia   . BPH (benign prostatic hyperplasia)     Patient Active Problem List   Diagnosis Date Noted  . Chest pain 06/03/2015  . Chronic systolic CHF (congestive heart failure) (HCC) 01/04/2015  . GERD (gastroesophageal reflux disease) 01/04/2015  . HTN (hypertension) 01/04/2015  . Anxiety 01/04/2015  . Chronic a-fib (HCC) 01/04/2015  . Dementia 01/04/2015  . COPD (chronic obstructive pulmonary disease) (HCC) 01/04/2015  . BPH (benign prostatic hyperplasia) 01/04/2015  . HCAP (healthcare-associated pneumonia) 07/04/2014  . COPD exacerbation (HCC) 07/04/2014  . Aneurysm of aorta (HCC) 07/04/2014    Past Surgical History  Procedure Laterality Date  . Coronary artery bypass graft    . Prostate surgery    . Abdominal aortic aneurysm repair       Current Outpatient Rx  Name  Route  Sig  Dispense  Refill  . acetaminophen (TYLENOL) 650 MG CR tablet   Oral   Take 650 mg by mouth 2 (two) times daily as needed for pain.         Marland Kitchen albuterol (PROVENTIL HFA;VENTOLIN HFA) 108 (90 Base) MCG/ACT inhaler   Inhalation   Inhale 2 puffs into the lungs every 4 (four) hours as needed for wheezing or shortness of breath.          . ALPRAZolam (XANAX) 0.25 MG tablet   Oral   Take 0.25 mg by mouth every 8 (eight) hours as needed for anxiety.          Marland Kitchen aspirin EC 81 MG tablet   Oral   Take 81 mg by mouth every evening.          . bimatoprost (LUMIGAN) 0.01 % SOLN   Both Eyes   Place 1 drop into both eyes at bedtime.          . brimonidine-timolol (COMBIGAN) 0.2-0.5 % ophthalmic solution   Both Eyes   Place 1 drop into both eyes 2 (two) times daily.         . cholecalciferol (VITAMIN D) 400 UNITS TABS tablet   Oral   Take 400 Units by mouth daily.         Marland Kitchen dextromethorphan-guaiFENesin (ROBAFEN DM CGH/CHEST CONGEST) 10-100 MG/5ML liquid   Oral   Take 10 mLs by mouth every 4 (four) hours as needed for cough.         Marland Kitchen  dicyclomine (BENTYL) 20 MG tablet   Oral   Take 20 mg by mouth every 8 (eight) hours as needed for spasms.         . digoxin (LANOXIN) 0.125 MG tablet   Oral   Take 0.125 mg by mouth daily.         . diphenhydrAMINE (BENADRYL) 25 mg capsule   Oral   Take 25 mg by mouth at bedtime as needed for itching.         . docusate sodium (COLACE) 100 MG capsule   Oral   Take 100 mg by mouth 2 (two) times daily.         . dorzolamide (TRUSOPT) 2 % ophthalmic solution   Both Eyes   Place 1 drop into both eyes 2 (two) times daily.         Marland Kitchen. doxycycline (VIBRAMYCIN) 50 MG capsule   Oral   Take 2 capsules (100 mg total) by mouth 2 (two) times daily. Patient not taking: Reported on 06/03/2015   20 capsule   0   . escitalopram (LEXAPRO) 10 MG tablet   Oral   Take 15 mg by mouth daily.          . finasteride (PROSCAR) 5 MG tablet   Oral   Take 5 mg by mouth daily.         . Fluticasone-Salmeterol (ADVAIR) 250-50 MCG/DOSE AEPB   Inhalation   Inhale 1 puff into the lungs 2 (two) times daily.         . furosemide (LASIX) 40 MG tablet   Oral   Take 40 mg by mouth daily.         . hydrocortisone cream 1 %   Topical   Apply 1 application topically as needed for itching.          Marland Kitchen. ipratropium-albuterol (DUONEB) 0.5-2.5 (3) MG/3ML SOLN   Nebulization   Take 3 mLs by nebulization 3 (three) times daily as needed (for shortness of breath/wheezing).          Marland Kitchen. levothyroxine (SYNTHROID, LEVOTHROID) 25 MCG tablet   Oral   Take 25 mcg by mouth daily.          Marland Kitchen. lisinopril (PRINIVIL,ZESTRIL) 2.5 MG tablet   Oral   Take 2.5 mg by mouth daily.         . meloxicam (MOBIC) 7.5 MG tablet   Oral   Take 7.5 mg by mouth daily.         . metoCLOPramide (REGLAN) 10 MG tablet   Oral   Take 10 mg by mouth 3 (three) times daily before meals.         . metoprolol tartrate (LOPRESSOR) 25 MG tablet   Oral   Take 12.5 mg by mouth daily.          . mineral oil enema   Rectal   Place 1 enema rectally once as needed for severe constipation.         . mirtazapine (REMERON) 30 MG tablet   Oral   Take 30 mg by mouth at bedtime.         Marland Kitchen. nystatin (MYCOSTATIN) 100000 UNIT/ML suspension   Oral   Take 5 mLs by mouth every 4 (four) hours as needed (for thrush). Pt is to swish and spit.         Marland Kitchen. ondansetron (ZOFRAN) 4 MG tablet   Oral   Take 4 mg by mouth 2 (two) times daily as needed for  nausea or vomiting.         . pantoprazole (PROTONIX) 40 MG tablet   Oral   Take 40 mg by mouth daily.         . polyethylene glycol (MIRALAX / GLYCOLAX) packet   Oral   Take 17 g by mouth daily as needed for moderate constipation.         . predniSONE (DELTASONE) 10 MG tablet   Oral   Take 1 tablet (10 mg total) by mouth daily with breakfast. Patient not  taking: Reported on 06/03/2015   20 tablet   0     Label  & dispense according to the schedule below: ...   . Prenatal Vit-Fe Fumarate-FA (PREPLUS) 27-1 MG TABS   Oral   Take 1 tablet by mouth daily.         . QUEtiapine (SEROQUEL) 25 MG tablet   Oral   Take 25 mg by mouth 3 (three) times daily.         . QUEtiapine (SEROQUEL) 50 MG tablet   Oral   Take 50 mg by mouth at bedtime.         . simethicone (MYLICON) 80 MG chewable tablet   Oral   Chew 80 mg by mouth every 4 (four) hours as needed for flatulence.         . Skin Protectants, Misc. (EUCERIN) cream   Topical   Apply 1 application topically 2 (two) times daily. Pt applies to arms and legs.           Allergies Review of patient's allergies indicates no known allergies.  Family History  Problem Relation Age of Onset  . CAD Father   . Cancer Father     Social History Social History  Substance Use Topics  . Smoking status: Former Smoker -- 50 years  . Smokeless tobacco: None  . Alcohol Use: No    Review of Systems  Constitutional: Positive for fever/chills Eyes: No visual changes. ENT: No sore throat. Cardiovascular: Positive for chest pain. Respiratory: Positive for shortness of breath. Gastrointestinal: No abdominal pain.  No nausea, no vomiting.  No diarrhea.  No constipation. Genitourinary: Negative for dysuria. Musculoskeletal: Negative for back pain. Skin: Negative for rash. Neurological: Negative for headaches, focal weakness or numbness.  10-point ROS otherwise negative.  ____________________________________________   PHYSICAL EXAM:  VITAL SIGNS: ED Triage Vitals  Enc Vitals Group     BP --      Pulse Rate 08/26/15 0648 105     Resp 08/26/15 0648 20     Temp 08/26/15 0648 102.6 F (39.2 C)     Temp Source 08/26/15 0648 Oral     SpO2 08/26/15 0648 89 %     Weight 08/26/15 0648 154 lb (69.854 kg)     Height 08/26/15 0648 5\' 9"  (1.753 m)     Head Cir --      Peak Flow --       Pain Score --      Pain Loc --      Pain Edu? --      Excl. in GC? --     Constitutional: Alert and oriented. Chronically ill appearing and in mild acute distress. Eyes: Conjunctivae are normal. PERRL. EOMI. Head: Atraumatic. Nose: No congestion/rhinnorhea. Mouth/Throat: Mucous membranes are moist.  Oropharynx non-erythematous. Neck: No stridor.  Supple neck without meningismus. Hematological/Lymphatic/Immunilogical: No cervical lymphadenopathy. Cardiovascular: Tachycardic rate, regular rhythm. Grossly normal heart sounds.  Good peripheral circulation. Respiratory: Increased respiratory  effort.  No retractions. Lungs diminished with rhonchi. Gastrointestinal: Soft and nontender. No distention. No abdominal bruits. No CVA tenderness. Musculoskeletal: No lower extremity tenderness nor edema.  No joint effusions. Neurologic:  Normal speech and language. No gross focal neurologic deficits are appreciated.  Skin:  Skin is warm, dry and intact. No rash noted. Psychiatric: Mood and affect are normal. Speech and behavior are normal.  ____________________________________________   LABS (all labs ordered are listed, but only abnormal results are displayed)  Labs Reviewed  CULTURE, BLOOD (ROUTINE X 2)  CULTURE, BLOOD (ROUTINE X 2)  URINE CULTURE  COMPREHENSIVE METABOLIC PANEL  CBC WITH DIFFERENTIAL/PLATELET  LACTIC ACID, PLASMA  LACTIC ACID, PLASMA  BRAIN NATRIURETIC PEPTIDE  TROPONIN I  URINALYSIS COMPLETEWITH MICROSCOPIC ([facility] ONLY)   ____________________________________________  EKG  ED ECG REPORT I, Wyonia Fontanella J, the attending physician, personally viewed and interpreted this ECG.   Date: 08/26/2015  EKG Time: 0652  Rate: 95  Rhythm: atrial fibrillation, rate 95  Axis: Normal  Intervals:none  ST&T Change: Nonspecific  ____________________________________________  RADIOLOGY  Chest x-ray (viewed by me, interpreted per Dr. Pecolia Ades): Left lower lobe  pneumonia. ____________________________________________   PROCEDURES  Procedure(s) performed: None  Critical Care performed: Yes, see critical care note(s)   CRITICAL CARE Performed by: Irean Hong   Total critical care time: 30 minutes  Critical care time was exclusive of separately billable procedures and treating other patients.  Critical care was necessary to treat or prevent imminent or life-threatening deterioration.  Critical care was time spent personally by me on the following activities: development of treatment plan with patient and/or surrogate as well as nursing, discussions with consultants, evaluation of patient's response to treatment, examination of patient, obtaining history from patient or surrogate, ordering and performing treatments and interventions, ordering and review of laboratory studies, ordering and review of radiographic studies, pulse oximetry and re-evaluation of patient's condition.  ____________________________________________   INITIAL IMPRESSION / ASSESSMENT AND PLAN / ED COURSE  Pertinent labs & imaging results that were available during my care of the patient were reviewed by me and considered in my medical decision making (see chart for details).  80 year old male with a history of COPD and CHF sent from nursing facility with fever, altered mental status and respiratory distress. ED code sepsis immediately initiated upon patient's arrival. IV antibiotics ordered to cover healthcare acquired pneumonia. Will administer additional 325 mg Tylenol to what patient already received per EMS. Patient will not tolerate nonrebreather; will place on nasal cannula at 6 L area sats covering 88-90%. Anticipate hospital admission.  ----------------------------------------- 7:27 AM on 08/26/2015 -----------------------------------------  Updated patient imaging results. Will discuss with hospitalist to evaluate patient in the emergency department for  admission. Patient is a DO NOT RESUSCITATE. ____________________________________________   FINAL CLINICAL IMPRESSION(S) / ED DIAGNOSES  Final diagnoses:  Sepsis, due to unspecified organism (HCC)  Fever, unspecified fever cause  HCAP (healthcare-associated pneumonia)  Hypoxia  Chronic systolic CHF (congestive heart failure) (HCC)  Chronic a-fib (HCC)  Dementia, without behavioral disturbance  Chronic obstructive pulmonary disease, unspecified COPD type (HCC)      NEW MEDICATIONS STARTED DURING THIS VISIT:  New Prescriptions   No medications on file     Note:  This document was prepared using Dragon voice recognition software and may include unintentional dictation errors.    Irean Hong, MD 08/26/15 [phone]

## 2015-08-26 NOTE — Progress Notes (Signed)
   08/26/15 0900  Clinical Encounter Type  Visited With Patient  Visit Type Follow-up  Referral From Nurse  Consult/Referral To Chaplain  Spiritual Encounters  Spiritual Needs Emotional;Prayer  Stress Factors  Patient Stress Factors Exhausted;Health changes  Patient continues to require reassurance of care. He repeats, "Help me", but fails to elaborate on what he needs. He also repeats the number 4 for unknown reasons. Often when asked questions, patient goes quiet. Nurse Corrie DandyMary transported patient to 1A. I escorted them to the room so that the patient will be aware of continuity of care. I will continue to follow up throughout the day. Chap. Lakeshia Dohner G. Trevin Gartrell, ext. [phone]

## 2015-08-26 NOTE — ED Notes (Signed)
Attempted to call report; nurse on phone and will call back.

## 2015-08-26 NOTE — Clinical Social Work Note (Signed)
Clinical Social Work Assessment  Patient Details  Name: Darrell Mcconnell MRN: [id number] Date of Birth: Jul 01, 1935  Date of referral:  08/26/15               Reason for consult:  Facility Placement                Permission sought to share information with:  Family Supports Permission granted to share information::  Yes, Verbal Permission Granted  Name::     Darrell Mcconnell  Relationship::  Son  Contact Information:  [phone]  Housing/Transportation Living arrangements for the past 2 months:  Assisted Living Facility Source of Information:  Facility Patient Interpreter Needed:  None Criminal Activity/Legal Involvement Pertinent to Current Situation/Hospitalization:  No - Comment as needed Significant Relationships:  Adult Children Lives with:  Facility Resident Do you feel safe going back to the place where you live?  Yes Need for family participation in patient care:  Yes (Comment)  Care giving concerns:  No care giving concerns identified.   Social Worker assessment / plan:  CSW spoke with pt's family to address consult. [facility]. CSW introduced herself and explained role of social work. CSW also explained the process of returning to the facility. Pt's family is agreeable to pt returning to facility. CSW spoke with facility and pt is able to return when stable for discharge. CSW will continue to follow.   Employment status:  Retired Database administratornsurance information:  Managed Medicare, [place] PT Recommendations:  Not assessed at this time Information / Referral to community resources:  Other (Comment Required) [facility]  Patient/Family's Response to care:  Pt's family was appreciative of CSW support.   Patient/Family's Understanding of and Emotional Response to Diagnosis, Current Treatment, and Prognosis:  Pt's family would like for pt to return to facility as he has been living there for years.   Emotional Assessment Appearance:  Appears stated  age Attitude/Demeanor/Rapport:  Guarded Affect (typically observed):  Other, Restless Orientation:  Fluctuating Orientation (Suspected and/or reported Sundowners), Oriented to Self Alcohol / Substance use:  Never Used Psych involvement (Current and /or in the community):  No (Comment)  Discharge Needs  Concerns to be addressed:  Adjustment to Illness Readmission within the last 30 days:  No Current discharge risk:  Chronically ill Barriers to Discharge:  No Barriers Identified   Dede QuerySarah Joell Usman, LCSW 08/26/2015, 2:33 PM

## 2015-08-26 NOTE — Progress Notes (Signed)
Pt has been very anxious today.  He is worried he is going to die.  Chaplain has been by multiple times to visit/pray with the pt.  Family has been notified that the pt is upset by the MD.  Once of morphine did help for a short time.  Xanax just recently given

## 2015-08-26 NOTE — ED Notes (Signed)
Res[place] care from [place], [place]. Pt repeatedly requesting water (which MD approved and this nurse gave him), and blanket, which was not given due to pt's elevated body temp. All explained to pt, but he does not understand.

## 2015-08-26 NOTE — Progress Notes (Signed)
   08/26/15 1200  Clinical Encounter Type  Visited With Patient  Visit Type Follow-up  Referral From Nurse  Consult/Referral To Chaplain  Spiritual Encounters  Spiritual Needs Emotional  Stress Factors  Patient Stress Factors Exhausted;Health changes  Patient was anxious and distressed. Continued to speak about dying. He shared some fears about dying and requested prayer. After further discussion, we prayed for forgiveness, comfort, peace, and healthy response to his medical treatment. He calmed down and appeared to sleep as we left the room. Chap. Arieliz Latino G. Crisanto Nied, ext. [phone]

## 2015-08-26 NOTE — H&P (Signed)
[facility] - [place] at [facility]   PATIENT NAME: Darrell Mcconnell Alicea    MR#:  [id number]  DATE OF BIRTH:  November 09, 1935  DATE OF ADMISSION:  08/26/2015  PRIMARY CARE PHYSICIAN: MODY, SITAL, MD   REQUESTING/REFERRING PHYSICIAN: Dr Dolores FrameSung  CHIEF COMPLAINT:   Increasing sob and cough. Had fever 102.2 HISTORY OF PRESENT ILLNESS:  Darrell Mcconnell Sampedro  is a 80 y.o. male with a known history of Congestive heart failure systolic, COPD on chronic home oxygen, hypertension, anxiety comes to the emergency room from his retirement home with increasing shortness of breath. He was found to have tachycardia with heart rate into 100s along with hypoxia sats in the lower 80s on oxygen and fever 102.6. Patient was found to have left lower lobe pneumonia is being admitted with sepsis secondary to pneumonia. He received Vancomycin and ceftazidime in the ER. No family members present emergency room. PAST MEDICAL HISTORY:   Past Medical History  Diagnosis Date  . Chronic systolic CHF (congestive heart failure) (HCC)   . COPD (chronic obstructive pulmonary disease) (HCC)   . Aorta aneurysm (HCC)   . Asthma   . Hypertension   . Anxiety   . A-fib (HCC)   . Depression   . Hypothyroidism   . GERD (gastroesophageal reflux disease)   . Dementia   . BPH (benign prostatic hyperplasia)     PAST SURGICAL HISTOIRY:   Past Surgical History  Procedure Laterality Date  . Coronary artery bypass graft    . Prostate surgery    . Abdominal aortic aneurysm repair      SOCIAL HISTORY:   Social History  Substance Use Topics  . Smoking status: Former Smoker -- 50 years  . Smokeless tobacco: Not on file  . Alcohol Use: No    FAMILY HISTORY:   Family History  Problem Relation Age of Onset  . CAD Father   . Cancer Father     DRUG ALLERGIES:  No Known Allergies  REVIEW OF SYSTEMS:  Review of Systems  Constitutional: Positive for fever. Negative for chills and weight loss.  HENT: Negative for ear  discharge, ear pain and nosebleeds.   Eyes: Negative for blurred vision, pain and discharge.  Respiratory: Positive for cough and shortness of breath. Negative for wheezing and stridor.   Cardiovascular: Positive for palpitations. Negative for chest pain, orthopnea and PND.  Gastrointestinal: Negative for nausea, vomiting, abdominal pain and diarrhea.  Genitourinary: Negative for urgency and frequency.  Musculoskeletal: Negative for back pain and joint pain.  Neurological: Positive for weakness. Negative for sensory change, speech change and focal weakness.  Psychiatric/Behavioral: Negative for depression and hallucinations. The patient is not nervous/anxious.   All other systems reviewed and are negative.    MEDICATIONS AT HOME:   Prior to Admission medications   Medication Sig Start Date End Date Taking? Authorizing Provider  acetaminophen (TYLENOL) 650 MG CR tablet Take 650 mg by mouth 2 (two) times daily as needed for pain.   Yes Historical Provider, MD  albuterol (PROVENTIL HFA;VENTOLIN HFA) 108 (90 Base) MCG/ACT inhaler Inhale 2 puffs into the lungs See admin instructions. Every 4 to 6 hours PRN for shortness of breath   Yes Historical Provider, MD  ALPRAZolam (XANAX) 0.25 MG tablet Take 0.25 mg by mouth every 8 (eight) hours as needed for anxiety.    Yes Historical Provider, MD  aspirin EC 81 MG tablet Take 81 mg by mouth every evening.    Yes Historical Provider, MD  bimatoprost (  LUMIGAN) 0.01 % SOLN Place 1 drop into both eyes at bedtime.    Yes Historical Provider, MD  brimonidine-timolol (COMBIGAN) 0.2-0.5 % ophthalmic solution Place 1 drop into both eyes 2 (two) times daily.   Yes Historical Provider, MD  cholecalciferol (VITAMIN D) 400 UNITS TABS tablet Take 400 Units by mouth daily.   Yes Historical Provider, MD  dicyclomine (BENTYL) 20 MG tablet Take 20 mg by mouth every 8 (eight) hours as needed for spasms.   Yes Historical Provider, MD  digoxin (LANOXIN) 0.125 MG tablet Take  0.125 mg by mouth daily.   Yes Historical Provider, MD  docusate sodium (COLACE) 100 MG capsule Take 100 mg by mouth 2 (two) times daily.   Yes Historical Provider, MD  dorzolamide (TRUSOPT) 2 % ophthalmic solution Place 1 drop into both eyes 2 (two) times daily.   Yes Historical Provider, MD  escitalopram (LEXAPRO) 10 MG tablet Take 15 mg by mouth daily.   Yes Historical Provider, MD  finasteride (PROSCAR) 5 MG tablet Take 5 mg by mouth daily.   Yes Historical Provider, MD  Fluticasone-Salmeterol (ADVAIR) 250-50 MCG/DOSE AEPB Inhale 1 puff into the lungs every 12 (twelve) hours.    Yes Historical Provider, MD  furosemide (LASIX) 40 MG tablet Take 40 mg by mouth every morning.    Yes Historical Provider, MD  hydrocortisone cream 1 % Apply 1 application topically as needed for itching.    Yes Historical Provider, MD  levothyroxine (SYNTHROID, LEVOTHROID) 25 MCG tablet Take 25 mcg by mouth every morning.    Yes Historical Provider, MD  lisinopril (PRINIVIL,ZESTRIL) 2.5 MG tablet Take 2.5 mg by mouth daily.   Yes Historical Provider, MD  meloxicam (MOBIC) 7.5 MG tablet Take 7.5 mg by mouth daily.   Yes Historical Provider, MD  metoprolol tartrate (LOPRESSOR) 25 MG tablet Take 12.5 mg by mouth daily.    Yes Historical Provider, MD  mirtazapine (REMERON) 30 MG tablet Take 30 mg by mouth at bedtime.   Yes Historical Provider, MD  ondansetron (ZOFRAN) 4 MG tablet Take 4 mg by mouth 2 (two) times daily as needed for nausea or vomiting.   Yes Historical Provider, MD  pantoprazole (PROTONIX) 40 MG tablet Take 40 mg by mouth daily.   Yes Historical Provider, MD  polyethylene glycol (MIRALAX / GLYCOLAX) packet Take 17 g by mouth daily as needed for moderate constipation.   Yes Historical Provider, MD  Prenatal Vit-Fe Fumarate-FA (PREPLUS) 27-1 MG TABS Take 1 tablet by mouth daily.   Yes Historical Provider, MD  QUEtiapine (SEROQUEL) 25 MG tablet Take 25 mg by mouth 2 (two) times daily. At 12 pm and at 5 pm    Yes Historical Provider, MD  QUEtiapine (SEROQUEL) 50 MG tablet Take 50 mg by mouth at bedtime.   Yes Historical Provider, MD  simethicone (MYLICON) 80 MG chewable tablet Chew 80 mg by mouth every 4 (four) hours as needed for flatulence.   Yes Historical Provider, MD  Skin Protectants, Misc. (EUCERIN) cream Apply 1 application topically 2 (two) times daily. Pt applies to arms and legs.   Yes Historical Provider, MD  sodium phosphate (FLEET) enema Place 1 enema rectally as needed (for constipation). follow package directions   Yes Historical Provider, MD  ipratropium-albuterol (DUONEB) 0.5-2.5 (3) MG/3ML SOLN Take 3 mLs by nebulization 3 (three) times daily as needed (for shortness of breath/wheezing).     Historical Provider, MD      VITAL SIGNS:  Blood pressure 96/62, pulse 76, temperature  100.5 F (38.1 C), temperature source Oral, resp. rate 22, height 5\' 9"  (1.753 m), weight 69.854 kg (154 lb), SpO2 93 %.  PHYSICAL EXAMINATION:  GENERAL:  80 y.o.-year-old patient lying in the bed with mod acute distress.  EYES: Pupils equal, round, reactive to light and accommodation. No scleral icterus. Extraocular muscles intact.  HEENT: Head atraumatic, normocephalic. Oropharynx and nasopharynx clear.  NECK:  Supple, no jugular venous distention. No thyroid enlargement, no tenderness.  LUNGS: Coarse breath sounds bilaterally, no wheezing, rales,rhonchi or crepitation. No use of accessory muscles of respiration.  CARDIOVASCULAR: S1, S2 normal. No murmurs, rubs, or gallops. Tachycardia ABDOMEN: Soft, nontender, nondistended. Bowel sounds present. No organomegaly or mass.  EXTREMITIES: No pedal edema, cyanosis, or clubbing.  NEUROLOGIC: Cranial nerves II through XII are intact. Muscle strength 4/5 in all extremities. Sensation intact. Gait not checked.  Overall very weak  PSYCHIATRIC: The patient is alert and oriented x 2.  SKIN: No obvious rash, lesion, or ulcer.   LABORATORY PANEL:   CBC  Recent  Labs Lab 08/26/15 1002  WBC 12.0*  HGB 10.5*  HCT 31.6*  PLT 107*   ------------------------------------------------------------------------------------------------------------------  Chemistries   Recent Labs Lab 08/26/15 0702 08/26/15 1002  NA 143  --   K 3.7  --   CL 105  --   CO2 33*  --   GLUCOSE 90  --   BUN 19  --   CREATININE 0.82 0.92  CALCIUM 8.4*  --   AST 19  --   ALT 11*  --   ALKPHOS 72  --   BILITOT 1.0  --    ------------------------------------------------------------------------------------------------------------------  Cardiac Enzymes  Recent Labs Lab 08/26/15 1002  TROPONINI 0.04*   ------------------------------------------------------------------------------------------------------------------  RADIOLOGY:  Dg Chest Port 1 View  08/26/2015  CLINICAL DATA:  Chest pain and fever tonight. EXAM: PORTABLE CHEST 1 VIEW COMPARISON:  Chest x-ray 06/03/2015 FINDINGS: The heart is enlarged but stable. Moderate tortuosity and calcification of the thoracic aorta. Surgical changes from prior bypass surgery. Ill-defined airspace opacity in the left lower lobe consistent with pneumonia. The right lung is clear. No large pleural effusion. IMPRESSION: Left lower lobe pneumonia. Electronically Signed   By: Rudie MeyerP.  Gallerani M.D.   On: 08/26/2015 07:21   IMPRESSION AND PLAN:   Darrell Mcconnell  is a 80 y.o. male with a known history of Congestive heart failure systolic, COPD on chronic home oxygen, hypertension, anxiety comes to the emergency room from his retirement home with increasing shortness of breath. He was found to have tachycardia with heart rate into 100s along with hypoxia sats in the lower 80s on oxygen and fever 102.6. Patient was found to have left lower lobe pneumonia  1. Sepsis due to left lower lobe pneumonia -Admit to medical floor -IV fluids for hydration -IV Rocephin and Zithromax -DuoNeb nebs every 6 when necessary, oral inhalers -IV  Solu-Medrol -Follow up blood cultures sputum cultures and white count  2. Acute on Chronic systolic congestive heart failure in the setting of pneumonia and sepsis -Continue cardiac meds once blood pressure is stable. -We'll resume Lasix once BP is stable -Borderline elevated BNP likely has mild acute on chronic congestive heart failure -Cautious IV fluids  3. History of chronic atrial fibrillation -Unable to give any rate blocking agents secondary to her soft blood pressure  4. COPD exacerbation and acute on chronic -Nebs, oral inhalers, IV Solu-Medrol -Patient currently is on 5 L nasal Oxygen. He Does Not Want to Try Nonrebreather High Flow. -  When necessary morphine for anxiety  5. Related to hypotension due to sepsis and pneumonia  6. DVT prophylaxis subcutaneous Lovenox  # discussed elbow with patient's son Dagon Budai  Patient is a DO NOT RESUSCITATE. Son understands patient has poor prognosis    All the records are reviewed and case discussed with ED provider. Management plans discussed with the patient, family and they are in agreement.  CODE STATUS: DO NOT RESUSCITATE   TOTAL  critical TIME TAKING CARE OF THIS PATIENT: 50tes.    Denese Mentink M.D on 08/26/2015 at 1:44 PM  Between 7am to 6pm - Pager - [phone]  After 6pm go to [email or web address] - password [email or web address]  [facility]  Office  [phone]  CC: Primary care physician; MODY, Patricia Pesa, MD

## 2015-08-26 NOTE — Progress Notes (Signed)
PT Cancellation Note  Patient Details Name: Darrell Mcconnell MRN: [id number] DOB: 22-Sep-1935   Cancelled Treatment:    Reason Eval/Treat Not Completed: Patient declined. Pt with chaplain upon room entry, very anxious and distressed. Pt with visible pursed lip breathing, and expressing significant concern about his inability to breathe (O2 sats on 5L O2 by nasal cannula 95%). RN is aware. Pt declines bed level evaluation due to his feeling SOB. PT offers to check back tomorrow, pt replies "I won't be here tomorrow I will be in [place]". Will re-attempt at later date/time.   Tymere Depuy, SPT 08/26/2015, 3:42 PM

## 2015-08-26 NOTE — Progress Notes (Signed)
Pharmacy Note Renal dose adjust antibiotics  Pt currently ordered ceftriaxone and azithromycin. CrCl 74.6 ml/min No renal dose adjustment needed for these medications.

## 2015-08-26 NOTE — ED Notes (Signed)
Pt nauseated; verbal order requested and received for Zofran from Dr. Scotty CourtStafford. Administered.

## 2015-08-27 LAB — CBC
HEMATOCRIT: 34.4 % — AB (ref 40.0–52.0)
Hemoglobin: 11.4 g/dL — ABNORMAL LOW (ref 13.0–18.0)
MCH: 27.3 pg (ref 26.0–34.0)
MCHC: 33 g/dL (ref 32.0–36.0)
MCV: 82.7 fL (ref 80.0–100.0)
Platelets: 93 10*3/uL — ABNORMAL LOW (ref 150–440)
RBC: 4.15 MIL/uL — ABNORMAL LOW (ref 4.40–5.90)
RDW: 17.4 % — AB (ref 11.5–14.5)
WBC: 6.9 10*3/uL (ref 3.8–10.6)

## 2015-08-27 LAB — BASIC METABOLIC PANEL
Anion gap: 6 (ref 5–15)
BUN: 23 mg/dL — ABNORMAL HIGH (ref 6–20)
CALCIUM: 7.8 mg/dL — AB (ref 8.9–10.3)
CO2: 28 mmol/L (ref 22–32)
CREATININE: 0.85 mg/dL (ref 0.61–1.24)
Chloride: 108 mmol/L (ref 101–111)
GFR calc Af Amer: >60 mL/min (ref >60)
GFR calc non Af Amer: >60 mL/min (ref >60)
GLUCOSE: 148 mg/dL — AB (ref 65–99)
Potassium: 4.1 mmol/L (ref 3.5–5.1)
Sodium: 142 mmol/L (ref 135–145)

## 2015-08-27 LAB — URINE CULTURE

## 2015-08-27 MED ORDER — HALOPERIDOL LACTATE 5 MG/ML IJ SOLN
1.0000 mg | Freq: Four times a day (QID) | INTRAMUSCULAR | Status: DC | PRN
  Administered 2015-08-27 – 2015-08-28 (×3): 1 mg via INTRAVENOUS
  Filled 2015-08-27 (×3): qty 1

## 2015-08-27 MED ORDER — MORPHINE SULFATE (PF) 2 MG/ML IV SOLN
1.0000 mg | INTRAVENOUS | Status: AC
  Administered 2015-08-27: 1 mg via INTRAVENOUS
  Filled 2015-08-27: qty 1

## 2015-08-27 MED ORDER — FUROSEMIDE 10 MG/ML IJ SOLN
40.0000 mg | Freq: Once | INTRAMUSCULAR | Status: AC
  Administered 2015-08-27: 40 mg via INTRAVENOUS
  Filled 2015-08-27: qty 4

## 2015-08-27 NOTE — Progress Notes (Signed)
Patient is constantly getting up out of chair. Dr patel paged. Patient is agitated, chaplain is at bedside.

## 2015-08-27 NOTE — Evaluation (Signed)
Physical Therapy Evaluation Patient Details Name: Darrell Mcconnell MRN: [id number] DOB: 10-16-1935 Today's Date: 08/27/2015   History of Present Illness  Darrell Mcconnell is a 79yo white male who [facility] with tachycardia, hypoxia, and fever. PMH includes dementia, blindness from glaucoma, COPD (Chronic O2 at home), CHF, anxiety, HTN, Afib. Pt reports he lives in a facility x4 years, and performs limited AMB with RW but this needs confirmation from caregivers.  Clinical Impression  Upon entry, the patient is received semirecumbent in bed, no family/caregiver present. The pt is awake and agreeable to participate. The patient is perseverating about wanting Jesus to come 'take him' [facility], but after further questioning this seems to be related more to missing his wife than feeling ill; he firmly denies any intention of taking his own life. The pt is alert and oriented to self only, pleasant, conversational, and following simple commands consistently. Pt received on and remaining on 5L O2 throughout evaluation, with noted intermittent desaturation of 83-88% that is related to excessive talking more so than physical activity. There are also some questions about the accuracy of the SaO2 sensory which is replaced by RN at end of session. Functional mobility assessment demonstrates mild-moderate weakness, the pt now requiring maximal effort and min-assist physical assistance for bed mobility and transfers. The patient reports he feels much weaker compared to baseline.  Patient presenting with impairment of strength, range of motion, balance, and activity tolerance, limiting ability to perform ADL and mobility tasks at  baseline level of function. Patient will benefit from skilled intervention to address the above impairments and limitations, in order to restore to prior level of function, improve patient safety upon discharge, and to decrease falls risk.       Follow Up  Recommendations SNF;Supervision for mobility/OOB;Supervision/Assistance - 24 hour    Equipment Recommendations  None recommended by PT    Recommendations for Other Services       Precautions / Restrictions Precautions Precautions: Fall Precaution Comments: Blind, HOH, anxious      Mobility  Bed Mobility Overal bed mobility: Needs Assistance Bed Mobility: Supine to Sit     Supine to sit: Supervision     General bed mobility comments: VC for safety and setup due to VI.   Transfers Overall transfer level: Needs assistance Equipment used: None Transfers: Sit to/from Stand Sit to Stand: Min assist         General transfer comment: performs 8x at bedside with BUE HHA. Patient is weak, but maintains balance fairly well, with mildLOB x3  Ambulation/Gait Ambulation/Gait assistance: Min assist         Gait velocity interpretation: <1.8 ft/sec, indicative of risk for recurrent falls General Gait Details: Standing pivot to chair from bed, with multiple steps; heavy VC due to VI. Follows well when spoken loudly and clearly. Tactile cues for balance and confidence.   Stairs            Wheelchair Mobility    Modified Rankin (Stroke Patients Only)       Balance Overall balance assessment: No apparent balance deficits (not formally assessed);Needs assistance         Standing balance support: During functional activity;Single extremity supported Standing balance-Leahy Scale: Poor                               Pertinent Vitals/Pain Pain Assessment: No/denies pain  Home Living Family/patient expects to be discharged to:: Skilled nursing facility                      Prior Function Level of Independence: Needs assistance   Gait / Transfers Assistance Needed: limited due to blindness  ADL's / Homemaking Assistance Needed: Says he needs help with bathing and dressing.         Hand Dominance        Extremity/Trunk Assessment    Upper Extremity Assessment: Overall WFL for tasks assessed (5/5 in elbows bilat)           Lower Extremity Assessment: Overall WFL for tasks assessed;Generalized weakness         Communication      Cognition Arousal/Alertness: Awake/alert Behavior During Therapy: WFL for tasks assessed/performed Overall Cognitive Status: No family/caregiver present to determine baseline cognitive functioning       Memory: Decreased short-term memory (It takes him about 8 minutes to recall the name of his ALF.)              General Comments      Exercises        Assessment/Plan    PT Assessment Patient needs continued PT services  PT Diagnosis Difficulty walking;Generalized weakness   PT Problem List Decreased strength;Decreased range of motion;Decreased activity tolerance;Decreased balance;Decreased mobility;Decreased coordination  PT Treatment Interventions Gait training;DME instruction;Patient/family education;Stair training;Functional mobility training;Therapeutic activities;Therapeutic exercise;Balance training   PT Goals (Current goals can be found in the Care Plan section) Acute Rehab PT Goals Patient Stated Goal: Pt wants Jesus to take him so he can be with his wife.  PT Goal Formulation: Patient unable to participate in goal setting    Frequency Min 2X/week   Barriers to discharge        Co-evaluation               End of Session Equipment Utilized During Treatment: Oxygen Activity Tolerance: Patient tolerated treatment well;Patient limited by fatigue Patient left: in chair;with chair alarm set;with call bell/phone within reach;with nursing/sitter in room Nurse Communication: Mobility status;Other (comment)         Time: [id number] PT Time Calculation (min) (ACUTE ONLY): 29 min   Charges:   PT Evaluation $PT Eval High Complexity: 1 Procedure PT Treatments $Therapeutic Activity: 8-22 mins   PT G Codes:       10:27 AM, 08/27/2015 Rosamaria LintsAllan C Gurpreet Mariani,  PT, DPT Physical Therapist - [place] [phone](ASCOM)  [phone] (mobile)

## 2015-08-27 NOTE — Progress Notes (Signed)
Dr patel called back, morphine once ordered.

## 2015-08-27 NOTE — Progress Notes (Signed)
Patient is very agitated stating "I cant breath, can you breath for me." Patient is constantly getting out of bed without assistance, taking oxygen off. Dr Allena KatzPatel paged, waiting on callback.

## 2015-08-27 NOTE — Progress Notes (Signed)
Patient is still very agitated and uncooperative. Pulling off O2 tubing, getting out of bed without assistance. Sitter and prn haldol ordered.

## 2015-08-27 NOTE — Progress Notes (Signed)
PT is recommending SNF. Clinical Social Worker (CSW) contacted patient's son Darrell Mcconnell and made him aware of above. Per son patient would not do well at a SNF and [facility]. [facility] and made her aware of PT's recommendation and son's request. Per Darrell Mcconnell patient has pattern of doing well and then getting sick. Per Darrell Mcconnell she will come assess patient at the hospital tomorrow and make a determination to see if she can take patient back. Patient has an existing ALF [facility]. CSW will not change ALF [facility] until Darrell Mcconnell comes to assess patient. CSW will continue to follow and assist as needed.   Darrell LoutBailey Morgan, LCSW [phone]

## 2015-08-27 NOTE — Progress Notes (Signed)
[facility] - [place] at [facility]   PATIENT NAME: Darrell Mcconnell    MR#:  [id number]  DATE OF BIRTH:  11-19-1935  SUBJECTIVE:  Breathing about the same. Periods of restlesness and anxiety Morphine helps with breathing Ate some BF  REVIEW OF SYSTEMS:   Review of Systems  Constitutional: Negative for fever, chills and weight loss.  HENT: Negative for ear discharge, ear pain and nosebleeds.   Eyes: Negative for blurred vision, pain and discharge.  Respiratory: Positive for cough and shortness of breath. Negative for sputum production, wheezing and stridor.   Cardiovascular: Negative for chest pain, palpitations, orthopnea and PND.  Gastrointestinal: Negative for nausea, vomiting, abdominal pain and diarrhea.  Genitourinary: Negative for urgency and frequency.  Musculoskeletal: Negative for back pain and joint pain.  Neurological: Positive for weakness. Negative for sensory change, speech change and focal weakness.  Psychiatric/Behavioral: Negative for depression and hallucinations. The patient is nervous/anxious.    Tolerating Diet:yes Tolerating PT: pending  DRUG ALLERGIES:  No Known Allergies  VITALS:  Blood pressure 130/84, pulse 100, temperature 97.9 F (36.6 C), temperature source Oral, resp. rate 16, height 5\' 9"  (1.753 m), weight 69.854 kg (154 lb), SpO2 92 %.  PHYSICAL EXAMINATION:   Physical Exam  GENERAL:  80 y.o.-year-old patient lying in the bed with no acute distress. critcally illt EYES: Pupils equal, round, reactive to light and accommodation. No scleral icterus. Extraocular muscles intact.  HEENT: Head atraumatic, normocephalic. Oropharynx and nasopharynx clear.  NECK:  Supple, no jugular venous distention. No thyroid enlargement, no tenderness.  LUNGS: coarse breath sounds bilaterally, no wheezing, rales, rhonchi. No use of accessory muscles of respiration.  CARDIOVASCULAR: S1, S2 normal. No murmurs, rubs, or gallops.  ABDOMEN: Soft,  nontender, nondistended. Bowel sounds present. No organomegaly or mass.  EXTREMITIES: No cyanosis, clubbing or edema b/l.    NEUROLOGIC: Cranial nerves II through XII are intact. No focal Motor or sensory deficits b/l.   PSYCHIATRIC:  patient is alert and oriented x 3.  SKIN: No obvious rash, lesion, or ulcer.   LABORATORY PANEL:  CBC  Recent Labs Lab 08/27/15 0323  WBC 6.9  HGB 11.4*  HCT 34.4*  PLT 93*    Chemistries   Recent Labs Lab 08/26/15 0702  08/27/15 0323  NA 143  --  142  K 3.7  --  4.1  CL 105  --  108  CO2 33*  --  28  GLUCOSE 90  --  148*  BUN 19  --  23*  CREATININE 0.82  < > 0.85  CALCIUM 8.4*  --  7.8*  AST 19  --   --   ALT 11*  --   --   ALKPHOS 72  --   --   BILITOT 1.0  --   --   < > = values in this interval not displayed. Cardiac Enzymes  Recent Labs Lab 08/26/15 2222  TROPONINI <0.03   RADIOLOGY:  Dg Chest Port 1 View  08/26/2015  CLINICAL DATA:  Chest pain and fever tonight. EXAM: PORTABLE CHEST 1 VIEW COMPARISON:  Chest x-ray 06/03/2015 FINDINGS: The heart is enlarged but stable. Moderate tortuosity and calcification of the thoracic aorta. Surgical changes from prior bypass surgery. Ill-defined airspace opacity in the left lower lobe consistent with pneumonia. The right lung is clear. No large pleural effusion. IMPRESSION: Left lower lobe pneumonia. Electronically Signed   By: Rudie MeyerP.  Gallerani M.D.   On: 08/26/2015 07:21   ASSESSMENT AND PLAN:  Darrell Mcconnell is a 80 y.o. male with a known history of Congestive heart failure systolic, COPD on chronic home oxygen, hypertension, anxiety comes to the emergency room from his retirement home with increasing shortness of breath. He was found to have tachycardia with heart rate into 100s along with hypoxia sats in the lower 80s on oxygen and fever 102.6. Patient was found to have left lower lobe pneumonia  1. Sepsis due to left lower lobe pneumonia -IV fluids for hydration-decreased rate -IV Rocephin  and Zithromax -DuoNeb nebs every 6 when necessary, oral inhalers -IV Solu-Medrol -Follow up blood cultures sputum cultures and white count  2. Acute on Chronic systolic congestive heart failure in the setting of pneumonia and sepsis -Continue cardiac meds once blood pressure is stable. -lasix IV 40 mg x1 todayute on chronic congestive heart failure -Cautious IV fluids  3. History of chronic atrial fibrillation -Unable to give any rate blocking agents secondary to her soft blood pressure  4. COPD exacerbation and acute on chronic -Nebs, oral inhalers, IV Solu-Medrol -Patient currently is on 5 L nasal Oxygen. He Does Not Want to Try Nonrebreather High Flow. -When necessary morphine for anxiety  5. Related to hypotension due to sepsis and pneumonia  6. DVT prophylaxis subcutaneous Lovenox  Case discussed with Care Management/Social Worker. Management plans discussed with the patient, family and they are in agreement.  CODE STATUS: DNR  DVT Prophylaxis: lovenox  TOTAL TIME TAKING CARE OF THIS PATIENT: *30 minutes.  >50% time spent on counselling and coordination of care  POSSIBLE D/C IN *1-2DAYS, DEPENDING ON CLINICAL CONDITION.  Note: This dictation was prepared with Dragon dictation along with smaller phrase technology. Any transcriptional errors that result from this process are unintentional.  Brayn Eckstein M.D on 08/27/2015 at 10:49 AM  Between 7am to 6pm - Pager - [phone]  After 6pm go to [email or web address] - password [email or web address] [facility]  [facility]  Office  [phone]  CC: Primary care physician; Lyndon CodeKHAN, FOZIA M, MD

## 2015-08-27 NOTE — [place] FL2 (Signed)
[place] MEDICAID FL2 LEVEL OF CARE SCREENING TOOL     IDENTIFICATION  Patient Name: Darrell Mcconnell Birthdate: [birth year] Sex: male Admission Date (Current Location): 08/26/2015  [place]   Facility and Address:  [facility], [address], [place], [place] [address]      Provider Number: [id number]  Attending Physician Name and Address:  Enedina FinnerSona Patel, MD  Relative Name and Phone Number:       Current Level of Care: Hospital Recommended Level of Care: Assisted Living Facility Prior Approval Number:    Date Approved/Denied:   PASRR Number: [id number]  Discharge Plan: Domiciliary (Rest home)    Current Diagnoses: Patient Active Problem List   Diagnosis Date Noted  . Sepsis (HCC) 08/26/2015  . Pressure ulcer 08/26/2015  . Chest pain 06/03/2015  . Chronic systolic CHF (congestive heart failure) (HCC) 01/04/2015  . GERD (gastroesophageal reflux disease) 01/04/2015  . HTN (hypertension) 01/04/2015  . Anxiety 01/04/2015  . Chronic a-fib (HCC) 01/04/2015  . Dementia 01/04/2015  . COPD (chronic obstructive pulmonary disease) (HCC) 01/04/2015  . BPH (benign prostatic hyperplasia) 01/04/2015  . HCAP (healthcare-associated pneumonia) 07/04/2014  . COPD exacerbation (HCC) 07/04/2014  . Aneurysm of aorta (HCC) 07/04/2014    Orientation RESPIRATION BLADDER Height & Weight     Self  O2 (5L) Continent Weight: 154 lb (69.854 kg) Height:  5\' 9"  (175.3 cm)  BEHAVIORAL SYMPTOMS/MOOD NEUROLOGICAL BOWEL NUTRITION STATUS      Continent Diet (Soft)  AMBULATORY STATUS COMMUNICATION OF NEEDS Skin   Limited Assist Verbally Normal                       Personal Care Assistance Level of Assistance  Bathing, Feeding, Dressing Bathing Assistance: Limited assistance Feeding assistance: Independent Dressing Assistance: Limited assistance     Functional Limitations Info  Sight, Hearing, Speech Sight Info: Impaired Hearing Info:  Impaired Speech Info: Adequate    SPECIAL CARE FACTORS FREQUENCY                       Contractures      Additional Factors Info  Code Status, Allergies, Psychotropic Code Status Info: DNR Allergies Info: No known allergies Psychotropic Info: Medications         Current Medications (08/27/2015):  This is the current hospital active medication list Current Facility-Administered Medications  Medication Dose Route Frequency Provider Last Rate Last Dose  . 0.9 %  sodium chloride infusion   Intravenous Continuous Enedina FinnerSona Patel, MD 50 mL/hr at 08/27/15 0904    . acetaminophen (TYLENOL) tablet 650 mg  650 mg Oral Q6H PRN Enedina FinnerSona Patel, MD   650 mg at 08/26/15 1032   Or  . acetaminophen (TYLENOL) suppository 650 mg  650 mg Rectal Q6H PRN Enedina FinnerSona Patel, MD      . albuterol (PROVENTIL) (2.5 MG/3ML) 0.083% nebulizer solution 2.5 mg  2.5 mg Nebulization Q6H PRN Enedina FinnerSona Patel, MD      . ALPRAZolam Prudy Feeler(XANAX) tablet 0.25 mg  0.25 mg Oral Q8H PRN Enedina FinnerSona Patel, MD   0.25 mg at 08/27/15 0800  . aspirin EC tablet 81 mg  81 mg Oral QPM Enedina FinnerSona Patel, MD   81 mg at 08/26/15 1601  . azithromycin (ZITHROMAX) tablet 500 mg  500 mg Oral Q24H Enedina FinnerSona Patel, MD   500 mg at 08/27/15 0815  . brimonidine (ALPHAGAN) 0.2 % ophthalmic solution 1 drop  1 drop Both Eyes BID Enedina FinnerSona Patel, MD  1 drop at 08/26/15 1133   And  . timolol (TIMOPTIC) 0.5 % ophthalmic solution 1 drop  1 drop Both Eyes BID Enedina FinnerSona Patel, MD   1 drop at 08/26/15 1130  . cefTRIAXone (ROCEPHIN) 1 g in dextrose 5 % 50 mL IVPB  1 g Intravenous Q24H Enedina FinnerSona Patel, MD   1 g at 08/26/15 1126  . cholecalciferol (VITAMIN D) tablet 400 Units  400 Units Oral Daily Enedina FinnerSona Patel, MD   400 Units at 08/27/15 0800  . dicyclomine (BENTYL) tablet 20 mg  20 mg Oral Q8H PRN Enedina FinnerSona Patel, MD      . docusate sodium (COLACE) capsule 100 mg  100 mg Oral BID Enedina FinnerSona Patel, MD   100 mg at 08/27/15 0800  . dorzolamide (TRUSOPT) 2 % ophthalmic solution 1 drop  1 drop Both Eyes BID Enedina FinnerSona Patel, MD   1  drop at 08/26/15 1136  . enoxaparin (LOVENOX) injection 40 mg  40 mg Subcutaneous Q24H Enedina FinnerSona Patel, MD   40 mg at 08/26/15 2049  . escitalopram (LEXAPRO) tablet 15 mg  15 mg Oral Daily Enedina FinnerSona Patel, MD   15 mg at 08/27/15 0800  . finasteride (PROSCAR) tablet 5 mg  5 mg Oral Daily Enedina FinnerSona Patel, MD   5 mg at 08/27/15 0800  . latanoprost (XALATAN) 0.005 % ophthalmic solution 1 drop  1 drop Both Eyes QHS Enedina FinnerSona Patel, MD   1 drop at 08/26/15 2051  . levothyroxine (SYNTHROID, LEVOTHROID) tablet 25 mcg  25 mcg Oral Gabriela EvesBH-q7a Sona Patel, MD   25 mcg at 08/27/15 0759  . meloxicam (MOBIC) tablet 7.5 mg  7.5 mg Oral Daily Enedina FinnerSona Patel, MD   7.5 mg at 08/27/15 0800  . methylPREDNISolone sodium succinate (SOLU-MEDROL) 125 mg/2 mL injection 60 mg  60 mg Intravenous Q12H Enedina FinnerSona Patel, MD   60 mg at 08/27/15 0801  . mirtazapine (REMERON) tablet 30 mg  30 mg Oral QHS Enedina FinnerSona Patel, MD   30 mg at 08/26/15 2049  . mometasone-formoterol (DULERA) 200-5 MCG/ACT inhaler 2 puff  2 puff Inhalation BID Enedina FinnerSona Patel, MD   2 puff at 08/27/15 0811  . ondansetron (ZOFRAN) tablet 4 mg  4 mg Oral Q6H PRN Enedina FinnerSona Patel, MD       Or  . ondansetron (ZOFRAN) injection 4 mg  4 mg Intravenous Q6H PRN Enedina FinnerSona Patel, MD      . pantoprazole (PROTONIX) EC tablet 40 mg  40 mg Oral Daily Enedina FinnerSona Patel, MD   40 mg at 08/27/15 0800  . polyethylene glycol (MIRALAX / GLYCOLAX) packet 17 g  17 g Oral Daily PRN Enedina FinnerSona Patel, MD      . prenatal multivitamin tablet 1 tablet  1 tablet Oral Daily Enedina FinnerSona Patel, MD   1 tablet at 08/27/15 0801  . QUEtiapine (SEROQUEL) tablet 25 mg  25 mg Oral BID Enedina FinnerSona Patel, MD   25 mg at 08/26/15 1601  . QUEtiapine (SEROQUEL) tablet 50 mg  50 mg Oral QHS Enedina FinnerSona Patel, MD   50 mg at 08/26/15 2050  . simethicone (MYLICON) chewable tablet 80 mg  80 mg Oral Q4H PRN Enedina FinnerSona Patel, MD         Discharge Medications: Please see discharge summary for a list of discharge medications.  Relevant Imaging Results:  Relevant Lab Results:   Additional  Information SSN:  [id number]  Haig ProphetMorgan, Jelesa Mangini G, LCSW

## 2015-08-28 ENCOUNTER — Inpatient Hospital Stay: Payer: Medicare Other

## 2015-08-28 MED ORDER — ALUM & MAG HYDROXIDE-SIMETH 200-200-20 MG/5ML PO SUSP
30.0000 mL | Freq: Four times a day (QID) | ORAL | Status: DC | PRN
  Administered 2015-08-28 – 2015-08-29 (×2): 30 mL via ORAL
  Filled 2015-08-28 (×2): qty 30

## 2015-08-28 MED ORDER — MORPHINE SULFATE (CONCENTRATE) 10 MG/0.5ML PO SOLN
5.0000 mg | ORAL | Status: DC | PRN
  Administered 2015-08-28 – 2015-08-29 (×3): 5 mg via ORAL
  Filled 2015-08-28 (×3): qty 1

## 2015-08-28 MED ORDER — PREDNISONE 50 MG PO TABS
50.0000 mg | ORAL_TABLET | Freq: Every day | ORAL | Status: DC
  Administered 2015-08-29: 50 mg via ORAL
  Filled 2015-08-28: qty 1

## 2015-08-28 MED ORDER — METOPROLOL TARTRATE 25 MG PO TABS
25.0000 mg | ORAL_TABLET | Freq: Two times a day (BID) | ORAL | Status: DC
  Administered 2015-08-28 – 2015-08-29 (×3): 25 mg via ORAL
  Filled 2015-08-28 (×2): qty 1

## 2015-08-28 MED ORDER — RISPERIDONE 0.5 MG PO TBDP
0.5000 mg | ORAL_TABLET | Freq: Two times a day (BID) | ORAL | Status: DC
  Administered 2015-08-28 – 2015-08-29 (×3): 0.5 mg via ORAL
  Filled 2015-08-28 (×4): qty 1

## 2015-08-28 NOTE — Progress Notes (Addendum)
[facility] - [place] [facility]   PATIENT NAME: Darrell Mcconnell    MR#:  [id number]  DATE OF BIRTH:  [birth year]  SUBJECTIVE:  CHIEF COMPLAINT:   Chief Complaint  Patient presents with  . Chest Pain  . Fever  . Shortness of Breath   - complains that he cannot breathe - agitation yesterday- started on haldol prn and also sitter today - more calm this AM  REVIEW OF SYSTEMS:  Review of Systems  Constitutional: Positive for malaise/fatigue. Negative for fever and chills.  HENT: Negative for ear discharge and nosebleeds.   Eyes:       Patient is legally blind  Respiratory: Positive for shortness of breath. Negative for cough and wheezing.   Cardiovascular: Negative for chest pain, palpitations and leg swelling.  Gastrointestinal: Negative for nausea, vomiting, abdominal pain, diarrhea and constipation.  Genitourinary: Negative for dysuria.  Neurological: Negative for dizziness, seizures and headaches.  Psychiatric/Behavioral: The patient is nervous/anxious.     DRUG ALLERGIES:  No Known Allergies  VITALS:  Blood pressure 146/82, pulse 103, temperature 97.9 F (36.6 C), temperature source Oral, resp. rate 18, height 5\' 9"  (1.753 m), weight 69.854 kg (154 lb), SpO2 100 %.  PHYSICAL EXAMINATION:  Physical Exam  GENERAL:  80 y.o.-year-old patient lying in the bed with no acute distress.  EYES: Pupils equal, round, reactive to light and accommodation. No scleral icterus. Extraocular muscles intact.  HEENT: Head atraumatic, normocephalic. Oropharynx and nasopharynx clear.  NECK:  Supple, no jugular venous distention. No thyroid enlargement, no tenderness.  LUNGS: Normal breath sounds on right, decreased breath sounds on the left, no wheezing, rales,rhonchi or crepitation. No use of accessory muscles of respiration.  CARDIOVASCULAR: S1, S2 normal. No murmurs, rubs, or gallops.  ABDOMEN: Soft, nontender, nondistended. Bowel sounds present. No organomegaly or  mass.  EXTREMITIES: No pedal edema, cyanosis, or clubbing.  NEUROLOGIC: Cranial nerves II through XII are intact. Muscle strength 5/5 in all extremities. Sensation intact. Gait not checked.  PSYCHIATRIC: The patient is alert and oriented x 1-2.  SKIN: No obvious rash, lesion, or ulcer.    LABORATORY PANEL:   CBC  Recent Labs Lab 08/27/15 0323  WBC 6.9  HGB 11.4*  HCT 34.4*  PLT 93*   ------------------------------------------------------------------------------------------------------------------  Chemistries   Recent Labs Lab 08/26/15 0702  08/27/15 0323  NA 143  --  142  K 3.7  --  4.1  CL 105  --  108  CO2 33*  --  28  GLUCOSE 90  --  148*  BUN 19  --  23*  CREATININE 0.82  < > 0.85  CALCIUM 8.4*  --  7.8*  AST 19  --   --   ALT 11*  --   --   ALKPHOS 72  --   --   BILITOT 1.0  --   --   < > = values in this interval not displayed. ------------------------------------------------------------------------------------------------------------------  Cardiac Enzymes  Recent Labs Lab 08/26/15 2222  TROPONINI <0.03   ------------------------------------------------------------------------------------------------------------------  RADIOLOGY:  No results found.  EKG:   Orders placed or performed during the hospital encounter of 08/26/15  . ED EKG 12-Lead  . ED EKG 12-Lead  . EKG 12-Lead  . EKG 12-Lead    ASSESSMENT AND PLAN:   Darrell Mcconnell is a 80 y.o. male with a known history of Congestive heart failure systolic, COPD on chronic 2L home oxygen, hypertension, anxiety comes to the emergency room from his retirement home  with increasing shortness of breath. Admitted with sepsis secondary to pneumonia  1. Sepsis- due to left lower lobe pneumonia on CXR -IV Rocephin and Zithromax -DuoNeb nebs every 6 when necessary, oral inhalers -IV Solu-Medrol- change to prednisone -negative blood cultures  2. Acute on Chronic systolic congestive heart failure in the  setting of pneumonia and sepsis -Continue cardiac meds once blood pressure is stable. -received lasix yesterday, discontinue IV fluids today - check CXR today  3. History of chronic atrial fibrillation - add metoprolol today as BP normal and also HR elevated Not on chronic anti coagulation- risk of falls  4. COPD exacerbation and acute on chronic -Nebs, oral inhalers, steroids -Patient currently is on 4 L nasal Oxygen. Wean to 2l as tolerated. -When necessary morphine for anxiety  5. Depression/anxiety- continue seroquel, remeron and lexapro Haldol added for delirium- add risperidone bid today  6. DVT prophylaxis subcutaneous Lovenox   PT recommended SNF- family wants him to go back to [facility]. Discharge plans once agitation has improved.   All the records are reviewed and case discussed with Care Management/Social Workerr. Management plans discussed with the patient, family and they are in agreement.  CODE STATUS: DNR  TOTAL TIME TAKING CARE OF THIS PATIENT: 37 minutes.   POSSIBLE D/C IN 2 DAYS, DEPENDING ON CLINICAL CONDITION.   Gavrielle Streck M.D on 08/28/2015 at 9:58 AM  Between 7am to 6pm - Pager - [phone]  After 6pm go to [email or web address] - password [email or web address] [facility]  [facility]  Office  [phone]  CC: Primary care physician; Lyndon CodeKHAN, FOZIA M, MD

## 2015-08-28 NOTE — Progress Notes (Signed)
Referral for Palliative Care consult at patient's ALF following discharge received from [facility]. Information faxed to [facility] referral. Thank you. Dayna BarkerKaren Robertson RN, BSN, [facility], [facility] L[facility]ison

## 2015-08-28 NOTE — Progress Notes (Signed)
[facility] ALF administrator is at bedside and requested to see PT work with patient so she can watch and see if she can meet his needs at the ALF. Clinical Child psychotherapistocial Worker (CSW) contacted PT and made him aware of above. Per PT he can try to see patient within 30 to 45 minutes. CSW will continue to follow and assist as needed.   Jetta LoutBailey Morgan, LCSW [phone]

## 2015-08-28 NOTE — Care Management Important Message (Signed)
Important Message  Patient Details  Name: Darrell Mcconnell MRN: [id number] Date of Birth: 07-04-1935   Medicare Important Message Given:  Yes    Olegario MessierKathy A Haelyn Forgey 08/28/2015, 2:02 PM

## 2015-08-28 NOTE — Progress Notes (Signed)
[facility] she will come see patient today after lunch time. CSW made Lynette aware that patient has a Comptrollersitter. Per Haywood LassoLynette she is not concerned about the sitter and does not require patient to be 24 hour sitter free before returning to ALF. CSW will continue to follow and assist as needed.   Jetta LoutBailey Morgan, LCSW [phone]

## 2015-08-28 NOTE — Progress Notes (Signed)
Physical Therapy Treatment Patient Details Name: Darrell Mcconnell Dada MRN: [id number] DOB: May 24, 1935 Today's Date: 08/28/2015    History of Present Illness Darrell Mcconnell Aguila is a 80yo white male [facility], hypoxia, and fever. PMH includes dementia, blindness from glaucoma, COPD (Chronic O2 at home), CHF, anxiety, HTN, Afib. Pt reports he lives in a facility x4 years, and performs limited AMB with RW but this needs confirmation from caregivers.    PT Comments    CSW contacted PT to see patient for discharge updates as he is more lucid and able to follow commands today. His facility manager is present, confirms he is returning to his baseline level of cognition. Patient is able to transfer to EOB with use of hand rail (minimally) and no physical support. He can complete sit to stand transfer with RW with cuing for hand placement. He does require verbal cuing for directions given a new environment and his visual deficits. Of note he does feel short of breath during ambulation, requiring several rest breaks. On his last bout where he ambulated ~ 6750' consecutively his HR was noted to increase to 152, O2 in the mid 90s on 3L. At rest O2 at 98%. He did return to baseline HR and reported less symptoms at rest, though continued to report shortness of breath. He is on 2L at baseline. Would recommend he return home after discussion with facility manager as she reports patient will have a call button for 24 hour assistance with mobility, as well as the ability to receive HHPT. Discharge disposition relayed to CSW.  Follow Up Recommendations  Home health PT;Supervision/Assistance - 24 hour (Discussed with facility manager where he lives. She states he is weaker than baseline, but able to perform mobility at a level they can are comfortable assisting with.)     Equipment Recommendations  Rolling walker with 5" wheels    Recommendations for Other Services       Precautions / Restrictions  Precautions Precautions: Fall Precaution Comments: Blind, HOH, anxious Restrictions Weight Bearing Restrictions: No    Mobility  Bed Mobility Overal bed mobility: Needs Assistance Bed Mobility: Supine to Sit     Supine to sit: Supervision     General bed mobility comments: No verbal cues required, he does use hand rails minimally, no other deficits.   Transfers Overall transfer level: Needs assistance Equipment used: Rolling walker (2 wheeled) Transfers: Sit to/from Stand Sit to Stand: Min guard         General transfer comment: No physical assitance required, cuing for hand placement, prolonged time to complete. No buckling or loss of balance noted.   Ambulation/Gait Ambulation/Gait assistance: Min guard;Min assist Ambulation Distance (Feet):  (3 separate bouts for a total of ~ 100' ) Assistive device: Rolling walker (2 wheeled) Gait Pattern/deviations: WFL(Within Functional Limits);Narrow base of support;Trunk flexed   Gait velocity interpretation: Below normal speed for age/gender General Gait Details: Patient noted to require directional cues given his history of limited visual acuity and new environment. He takes short reciprocal steps, however fatigues quickly. He agrees to 3 separate bouts of gait with rest breaks in chair between each. Of note, on last bout he began to feel "sick" and HR noted to increase to 152 bpm. RN notified, however HR quickly returned to baseline (after ~ 30 seconds).    Stairs            Wheelchair Mobility    Modified Rankin (Stroke Patients Only)  Balance Overall balance assessment: Needs assistance Sitting-balance support: No upper extremity supported Sitting balance-Leahy Scale: Fair     Standing balance support: Bilateral upper extremity supported Standing balance-Leahy Scale: Fair                      Cognition Arousal/Alertness: Awake/alert Behavior During Therapy: WFL for tasks  assessed/performed Overall Cognitive Status: History of cognitive impairments - at baseline (He appears to have some baseline cognitive deficits, noted by very short answers. History of dementia. Per his facility owner present this is his baseline mentation. )                      Exercises      General Comments        Pertinent Vitals/Pain Pain Assessment: No/denies pain    Home Living                      Prior Function            PT Goals (current goals can now be found in the care plan section) Acute Rehab PT Goals PT Goal Formulation: Patient unable to participate in goal setting Progress towards PT goals: Progressing toward goals    Frequency  Min 2X/week    PT Plan Discharge plan needs to be updated    Co-evaluation             End of Session Equipment Utilized During Treatment: Oxygen;Gait belt Activity Tolerance: Patient tolerated treatment well;Patient limited by fatigue Patient left: in chair;with chair alarm set;with call bell/phone within reach;with nursing/sitter in room;with family/visitor present     Time: [id number] PT Time Calculation (min) (ACUTE ONLY): 29 min  Charges:  $Gait Training: 23-37 mins                    G Codes:      Kerin RansomPatrick A Morgaine Kimball, PT, DPT    08/28/2015, 4:04 PM

## 2015-08-28 NOTE — Care Management Note (Signed)
Case Management Note  Patient Details  Name: Revonda StandardClyde Stange MRN: [id number] Date of Birth: Dec 03, 1935  Subjective/Objective:    Patient will return to [facility], at discharge CSW  Following case. Preferred provider is [facility]. [facility].  Referral Placed with Tamela OddiBetsy at [facility] for PT and RN.                [facility]   Expected Discharge Date:  08/26/15               Expected Discharge Plan:  Home w Home Health Services  In-House Referral:  Clinical Social Work  Discharge planning Services  CM Consult  Post Acute Care Choice:    Choice offered to:  NA  DME Arranged:    DME Agency:  [facility]  HH Arranged:  RN, [facility]  [facility]  Status of Service:  Completed, signed off  If discussed at Long Length of Stay Meetings, dates discussed:    Additional Comments:  [name], Cataleah Stites L, RN 08/28/2015, 2:37 PM

## 2015-08-28 NOTE — Clinical Documentation Improvement (Signed)
Internal Medicine  Can the diagnosis of Hypoxia be further specified? Please provide a diagnosis associated with below conditions and treatment provided and document findings in next progress note. Thank you!   Respiratory Failure - acute, chronic, acute on chronic with hypoxia, hypercarbia, or combination of both  Respiratory Failure ruled out  Only Hypoxia as documented  Other  Clinically Undetermined  Document any associated diagnoses/conditions.  Supporting Information:  "He was found to have tachycardia with heart rate into 100s along with hypoxia sats in the lower 80s on oxygen and fever 102.6"  Placed in 5L FiO2 with sats improving to high 90's, 100%  Please exercise your independent, professional judgment when responding. A specific answer is not anticipated or expected.  Thank You,  Ruthine DoseEileen Earsel Shouse RN, BSN, CCDS Clinical Documentation Specialist [facility] [phone]; cell [phone]

## 2015-08-28 NOTE — Progress Notes (Signed)
Patient complaining of pain in lower chest area and shortness of breath. Breathing treatment is currently in process. Dr Nemiah CommanderKalisetti notified. Order for Maalox. Morphine prn.

## 2015-08-28 NOTE — Progress Notes (Addendum)
PT worked with patient today in front of [facility] Production designer, theatre/television/filmadministrator. PT is recommending home health. Haywood LassoLynette is agreeable for [facility] to [facility] and PT. Per Haywood LassoLynette patient was on hospice care however he was discharged from hospice care. Haywood LassoLynette inquired if patient could get hospice care again. CSW explained that home based palliative care can come and assess patient at the ALF to see if hospice may be appropriate again. CSW contacted patient's son [name] and left him a Engineer, technical salesvoicemail.   Patient's son [name] called CSW back. Per son he is agreeable for palliative to come out to ALF and start hospice services if needed. [facility] liaison is aware of above.    Jetta LoutBailey Morgan, LCSW [phone]

## 2015-08-29 LAB — BASIC METABOLIC PANEL
Anion gap: 3 — ABNORMAL LOW (ref 5–15)
BUN: 32 mg/dL — ABNORMAL HIGH (ref 6–20)
CALCIUM: 8.2 mg/dL — AB (ref 8.9–10.3)
CHLORIDE: 105 mmol/L (ref 101–111)
CO2: 33 mmol/L — ABNORMAL HIGH (ref 22–32)
CREATININE: 0.93 mg/dL (ref 0.61–1.24)
GFR calc non Af Amer: >60 mL/min (ref >60)
Glucose, Bld: 105 mg/dL — ABNORMAL HIGH (ref 65–99)
Potassium: 4.3 mmol/L (ref 3.5–5.1)
Sodium: 141 mmol/L (ref 135–145)

## 2015-08-29 LAB — CBC
HCT: 33.4 % — ABNORMAL LOW (ref 40.0–52.0)
HEMOGLOBIN: 11.1 g/dL — AB (ref 13.0–18.0)
MCH: 27.5 pg (ref 26.0–34.0)
MCHC: 33.2 g/dL (ref 32.0–36.0)
MCV: 83 fL (ref 80.0–100.0)
Platelets: 119 10*3/uL — ABNORMAL LOW (ref 150–440)
RBC: 4.02 MIL/uL — ABNORMAL LOW (ref 4.40–5.90)
RDW: 17.9 % — ABNORMAL HIGH (ref 11.5–14.5)
WBC: 8.1 10*3/uL (ref 3.8–10.6)

## 2015-08-29 MED ORDER — VITAMIN D3 10 MCG (400 UNIT) PO TABS: 400.0000 [IU] | ORAL_TABLET | Freq: Every day | ORAL | Status: AC

## 2015-08-29 MED ORDER — AZITHROMYCIN 500 MG PO TABS: 500.0000 mg | ORAL_TABLET | ORAL | Status: DC

## 2015-08-29 MED ORDER — FUROSEMIDE 20 MG PO TABS: 20.0000 mg | ORAL_TABLET | ORAL | Status: DC

## 2015-08-29 MED ORDER — CEFUROXIME AXETIL 500 MG PO TABS: 500.0000 mg | ORAL_TABLET | Freq: Two times a day (BID) | ORAL | Status: DC

## 2015-08-29 MED ORDER — ALPRAZOLAM 0.25 MG PO TABS: 0.2500 mg | ORAL_TABLET | Freq: Three times a day (TID) | ORAL | Status: DC | PRN

## 2015-08-29 MED ORDER — MORPHINE SULFATE (CONCENTRATE) 10 MG/0.5ML PO SOLN: 5.0000 mg | ORAL | Status: DC | PRN

## 2015-08-29 MED ORDER — ALUM & MAG HYDROXIDE-SIMETH 200-200-20 MG/5ML PO SUSP: 30.0000 mL | Freq: Four times a day (QID) | ORAL | Status: AC | PRN

## 2015-08-29 MED ORDER — RISPERIDONE 0.5 MG PO TBDP: 0.5000 mg | ORAL_TABLET | Freq: Two times a day (BID) | ORAL | Status: DC

## 2015-08-29 MED ORDER — METOPROLOL TARTRATE 25 MG PO TABS: 25.0000 mg | ORAL_TABLET | Freq: Two times a day (BID) | ORAL | Status: DC

## 2015-08-29 MED ORDER — PREDNISONE 10 MG (21) PO TBPK: 10.0000 mg | ORAL_TABLET | Freq: Every day | ORAL | Status: DC

## 2015-08-29 MED ORDER — BISACODYL 10 MG RE SUPP
10.0000 mg | Freq: Every day | RECTAL | Status: DC | PRN
  Administered 2015-08-29: 10 mg via RECTAL
  Filled 2015-08-29: qty 1

## 2015-08-29 NOTE — Discharge Summary (Signed)
[facility] [place] [facility]   PATIENT NAME: Darrell StandardClyde Merwin    MR#:  [id number]  DATE OF BIRTH:  1936-01-07  DATE OF ADMISSION:  08/26/2015 ADMITTING PHYSICIAN: Enedina FinnerSona Patel, MD  DATE OF DISCHARGE: 08/29/15  PRIMARY CARE PHYSICIAN: Lyndon CodeKHAN, FOZIA M, MD    ADMISSION DIAGNOSIS:  Hypoxia [R09.02] Chronic a-fib (HCC) [I48.2] HCAP (healthcare-associated pneumonia) [J18.9] Chronic systolic CHF (congestive heart failure) (HCC) [I50.22] Dementia, without behavioral disturbance [F03.90] Sepsis, due to unspecified organism (HCC) [A41.9] Fever, unspecified fever cause [R50.9] Chronic obstructive pulmonary disease, unspecified COPD type (HCC) [J44.9]  DISCHARGE DIAGNOSIS:  Active Problems:   Sepsis (HCC)   Pressure ulcer   SECONDARY DIAGNOSIS:   Past Medical History  Diagnosis Date  . Chronic systolic CHF (congestive heart failure) (HCC)   . COPD (chronic obstructive pulmonary disease) (HCC)   . Aorta aneurysm (HCC)   . Asthma   . Hypertension   . Anxiety   . A-fib (HCC)   . Depression   . Hypothyroidism   . GERD (gastroesophageal reflux disease)   . Dementia   . BPH (benign prostatic hyperplasia)     HOSPITAL COURSE:   Darrell StandardClyde Whelpley is a 80 y.o. male with a known history of Congestive heart failure systolic, COPD on chronic 2L home oxygen, hypertension, anxiety comes to the emergency room from his retirement home with increasing shortness of breath. Admitted with sepsis secondary to pneumonia  1. Sepsis- due to left lower lobe pneumonia on CXR -IV Rocephin and Zithromax- change to ceftin and finish off azithromycin -DuoNeb nebs when necessary, oral inhalers -prednisone taper -negative blood cultures  2. Acute on Chronic systolic congestive heart failure in the setting of pneumonia and sepsis -Continue cardiac meds. -lasix at discharge, also on lisinopril and metoprolol  3. History of chronic atrial fibrillation - on metoprolol, low dose digoxin Not  on chronic anti coagulation- risk of falls  4. COPD exacerbation and acute on chronic -Nebs, oral inhalers, steroids -Patient currently is on 2-3L nasal Oxygen. -When necessary roxanol for anxiety  5. Depression/anxiety- continue seroquel, remeron and lexapro on risperidone bid with improvement for agitation episodes   PT recommended home health with 24hr assistance- family wants him to go back to [facility].   DISCHARGE CONDITIONS:   guarded  CONSULTS OBTAINED:   none  DRUG ALLERGIES:  No Known Allergies  DISCHARGE MEDICATIONS:   Current Discharge Medication List    START taking these medications   Details  alum & mag hydroxide-simeth (MAALOX/MYLANTA) 200-200-20 MG/5ML suspension Take 30 mLs by mouth every 6 (six) hours as needed for indigestion or heartburn. Qty: 355 mL, Refills: 0    azithromycin (ZITHROMAX) 500 MG tablet Take 1 tablet (500 mg total) by mouth daily. X 2 more days Qty: 2 tablet, Refills: 0    cefUROXime (CEFTIN) 500 MG tablet Take 1 tablet (500 mg total) by mouth 2 (two) times daily with a meal. X 4 more days Qty: 8 tablet, Refills: 0    Morphine Sulfate (MORPHINE CONCENTRATE) 10 MG/0.5ML SOLN concentrated solution Take 0.25 mLs (5 mg total) by mouth every 2 (two) hours as needed for shortness of breath. Qty: 15 mL, Refills: 0    predniSONE (STERAPRED UNI-PAK 21 TAB) 10 MG (21) TBPK tablet Take 1 tablet (10 mg total) by mouth daily. 5 tabs PO x 1 day 4 tabs PO x 1 day 3 tabs PO x 1 day 2 tabs PO x 1 day 1 tab PO x 1 day and  stop Qty: 15 tablet, Refills: 0    risperiDONE (RISPERDAL M-TABS) 0.5 MG disintegrating tablet Take 1 tablet (0.5 mg total) by mouth 2 (two) times daily. Qty: 40 tablet, Refills: 0      CONTINUE these medications which have CHANGED   Details  ALPRAZolam (XANAX) 0.25 MG tablet Take 1 tablet (0.25 mg total) by mouth every 8 (eight) hours as needed for anxiety. Qty: 25 tablet, Refills: 0    !! cholecalciferol 400 units  tablet Take 1 tablet (400 Units total) by mouth daily. Qty: 30 each, Refills: 0    furosemide (LASIX) 20 MG tablet Take 1 tablet (20 mg total) by mouth every morning. Qty: 30 tablet, Refills: 2    metoprolol tartrate (LOPRESSOR) 25 MG tablet Take 1 tablet (25 mg total) by mouth 2 (two) times daily. Qty: 60 tablet, Refills: 2     !! - Potential duplicate medications found. Please discuss with provider.    CONTINUE these medications which have NOT CHANGED   Details  acetaminophen (TYLENOL) 650 MG CR tablet Take 650 mg by mouth 2 (two) times daily as needed for pain.    albuterol (PROVENTIL HFA;VENTOLIN HFA) 108 (90 Base) MCG/ACT inhaler Inhale 2 puffs into the lungs See admin instructions. Every 4 to 6 hours PRN for shortness of breath    aspirin EC 81 MG tablet Take 81 mg by mouth every evening.     bimatoprost (LUMIGAN) 0.01 % SOLN Place 1 drop into both eyes at bedtime.     brimonidine-timolol (COMBIGAN) 0.2-0.5 % ophthalmic solution Place 1 drop into both eyes 2 (two) times daily.    !! cholecalciferol (VITAMIN D) 400 UNITS TABS tablet Take 400 Units by mouth daily.    dicyclomine (BENTYL) 20 MG tablet Take 20 mg by mouth every 8 (eight) hours as needed for spasms.    digoxin (LANOXIN) 0.125 MG tablet Take 0.125 mg by mouth daily.    docusate sodium (COLACE) 100 MG capsule Take 100 mg by mouth 2 (two) times daily.    dorzolamide (TRUSOPT) 2 % ophthalmic solution Place 1 drop into both eyes 2 (two) times daily.    escitalopram (LEXAPRO) 10 MG tablet Take 15 mg by mouth daily.    finasteride (PROSCAR) 5 MG tablet Take 5 mg by mouth daily.    Fluticasone-Salmeterol (ADVAIR) 250-50 MCG/DOSE AEPB Inhale 1 puff into the lungs every 12 (twelve) hours.     hydrocortisone cream 1 % Apply 1 application topically as needed for itching.     levothyroxine (SYNTHROID, LEVOTHROID) 25 MCG tablet Take 25 mcg by mouth every morning.     lisinopril (PRINIVIL,ZESTRIL) 2.5 MG tablet Take 2.5  mg by mouth daily.    meloxicam (MOBIC) 7.5 MG tablet Take 7.5 mg by mouth daily.    mirtazapine (REMERON) 30 MG tablet Take 30 mg by mouth at bedtime.    ondansetron (ZOFRAN) 4 MG tablet Take 4 mg by mouth 2 (two) times daily as needed for nausea or vomiting.    pantoprazole (PROTONIX) 40 MG tablet Take 40 mg by mouth daily.    polyethylene glycol (MIRALAX / GLYCOLAX) packet Take 17 g by mouth daily as needed for moderate constipation.    Prenatal Vit-Fe Fumarate-FA (PREPLUS) 27-1 MG TABS Take 1 tablet by mouth daily.    !! QUEtiapine (SEROQUEL) 25 MG tablet Take 25 mg by mouth 2 (two) times daily. At 12 pm and at 5 pm    !! QUEtiapine (SEROQUEL) 50 MG tablet Take 50 mg by  mouth at bedtime.    simethicone (MYLICON) 80 MG chewable tablet Chew 80 mg by mouth every 4 (four) hours as needed for flatulence.    Skin Protectants, Misc. (EUCERIN) cream Apply 1 application topically 2 (two) times daily. Pt applies to arms and legs.    sodium phosphate (FLEET) enema Place 1 enema rectally as needed (for constipation). follow package directions    ipratropium-albuterol (DUONEB) 0.5-2.5 (3) MG/3ML SOLN Take 3 mLs by nebulization 3 (three) times daily as needed (for shortness of breath/wheezing).      !! - Potential duplicate medications found. Please discuss with provider.       DISCHARGE INSTRUCTIONS:   1. PCP f/u in 1-2 weeks 2. Palliative care consult at the assisted living facility  If you experience worsening of your admission symptoms, develop shortness of breath, life threatening emergency, suicidal or homicidal thoughts you must seek medical attention immediately by calling 911 or calling your MD immediately  if symptoms less severe.  You Must read complete instructions/literature along with all the possible adverse reactions/side effects for all the Medicines you take and that have been prescribed to you. Take any new Medicines after you have completely understood and accept all  the possible adverse reactions/side effects.   Please note  You were cared for by a hospitalist during your hospital stay. If you have any questions about your discharge medications or the care you received while you were in the hospital after you are discharged, you can call the unit and asked to speak with the hospitalist on call if the hospitalist that took care of you is not available. Once you are discharged, your primary care physician will handle any further medical issues. Please note that NO REFILLS for any discharge medications will be authorized once you are discharged, as it is imperative that you return to your primary care physician (or establish a relationship with a primary care physician if you do not have one) for your aftercare needs so that they can reassess your need for medications and monitor your lab values.    Today   CHIEF COMPLAINT:   Chief Complaint  Patient presents with  . Chest Pain  . Fever  . Shortness of Breath     VITAL SIGNS:  Blood pressure 134/70, pulse 87, temperature 97.5 F (36.4 C), temperature source Oral, resp. rate 16, height  (1.753 m), weight 69.854 kg (154 lb), SpO2 99 %.  I/O:   Intake/Output Summary (Last 24 hours) at 08/29/15 1139 Last data filed at 08/29/15 0900  Gross per 24 hour  Intake    480 ml  Output   1025 ml  Net   -545 ml    PHYSICAL EXAMINATION:   Physical Exam  GENERAL: 80 y.o.-year-old patient lying in the bed with no acute distress.  EYES: Pupils equal, round, reactive to light and accommodation. No scleral icterus. Extraocular muscles intact. Patient is legally blind HEENT: Head atraumatic, normocephalic. Oropharynx and nasopharynx clear.  NECK: Supple, no jugular venous distention. No thyroid enlargement, no tenderness.  LUNGS: Normal breath sounds on right, decreased breath sounds on the left, no wheezing, rales,rhonchi or crepitation. No use of accessory muscles of respiration.  CARDIOVASCULAR:  S1, S2 normal. No murmurs, rubs, or gallops.  ABDOMEN: Soft, nontender, nondistended. Bowel sounds present. No organomegaly or mass.  EXTREMITIES: No pedal edema, cyanosis, or clubbing.  NEUROLOGIC: Cranial nerves II through XII are intact. Muscle strength 5/5 in all extremities. Sensation intact. Gait not checked.  PSYCHIATRIC: The  patient is alert and oriented x 1-2.  SKIN: No obvious rash, lesion, or ulcer.   DATA REVIEW:   CBC  Recent Labs Lab 08/29/15 0355  WBC 8.1  HGB 11.1*  HCT 33.4*  PLT 119*    Chemistries   Recent Labs Lab 08/26/15 0702  08/29/15 0355  NA 143  < > 141  K 3.7  < > 4.3  CL 105  < > 105  CO2 33*  < > 33*  GLUCOSE 90  < > 105*  BUN 19  < > 32*  CREATININE 0.82  < > 0.93  CALCIUM 8.4*  < > 8.2*  AST 19  --   --   ALT 11*  --   --   ALKPHOS 72  --   --   BILITOT 1.0  --   --   < > = values in this interval not displayed.  Cardiac Enzymes  Recent Labs Lab 08/26/15 2222  TROPONINI <0.03    Microbiology Results  Results for orders placed or performed during the hospital encounter of 08/26/15  Blood Culture (routine x 2)     Status: None (Preliminary result)   Collection Time: 08/26/15  7:01 AM  Result Value Ref Range Status   Specimen Description BLOOD LEFT ARM  Final   Special Requests AER 5CC ANA 5CC  Final   Culture NO GROWTH 3 DAYS  Final   Report Status PENDING  Incomplete  Urine culture     Status: Abnormal   Collection Time: 08/26/15  7:01 AM  Result Value Ref Range Status   Specimen Description URINE, CLEAN CATCH  Final   Special Requests NONE  Final   Culture MULTIPLE SPECIES PRESENT, SUGGEST RECOLLECTION (A)  Final   Report Status 08/27/2015 FINAL  Final  Blood Culture (routine x 2)     Status: None (Preliminary result)   Collection Time: 08/26/15  7:02 AM  Result Value Ref Range Status   Specimen Description BLOOD RIGHT WRIST  Final   Special Requests NONE  Final   Culture NO GROWTH 3 DAYS  Final   Report Status  PENDING  Incomplete  MRSA PCR Screening     Status: None   Collection Time: 08/26/15 10:21 AM  Result Value Ref Range Status   MRSA by PCR NEGATIVE NEGATIVE Final    Comment:        The GeneXpert MRSA Assay (FDA approved for NASAL specimens only), is one component of a comprehensive MRSA colonization surveillance program. It is not intended to diagnose MRSA infection nor to guide or monitor treatment for MRSA infections.     RADIOLOGY:  Dg Chest 2 View  08/28/2015  CLINICAL DATA:  Congestive heart failure EXAM: CHEST  2 VIEW COMPARISON:  08/26/2015 FINDINGS: Borderline cardiomegaly again noted. Status post CABG. No pulmonary edema. There is small bilateral pleural effusion. Left basilar atelectasis or infiltrate with slight improvement in aeration. Trace right basilar atelectasis. IMPRESSION: Status post CABG. Small bilateral pleural effusion. Left basilar atelectasis or infiltrate with slight improvement in aeration. No pulmonary edema. Electronically Signed   By: Natasha Mead M.D.   On: 08/28/2015 11:27    EKG:   Orders placed or performed during the hospital encounter of 08/26/15  . ED EKG 12-Lead  . ED EKG 12-Lead  . EKG 12-Lead  . EKG 12-Lead      Management plans discussed with the patient, family and they are in agreement.  CODE STATUS:     Code Status  Orders        Start     Ordered   08/26/15 1008  Do not attempt resuscitation (DNR)   Continuous    Question Answer Comment  In the event of cardiac or respiratory ARREST Do not call a "code blue"   In the event of cardiac or respiratory ARREST Do not perform Intubation, CPR, defibrillation or ACLS   In the event of cardiac or respiratory ARREST Use medication by any route, position, wound care, and other measures to relive pain and suffering. May use oxygen, suction and manual treatment of airway obstruction as needed for comfort.      08/26/15 1007    Code Status History    Date Active Date Inactive Code  Status Order ID Comments User Context   06/03/2015  9:47 PM 06/04/2015  8:36 PM DNR [id number]  Houston Siren, MD Inpatient   02/20/2015  2:17 AM 02/20/2015  5:31 PM DNR [id number]  Oralia Manis, MD Inpatient   01/04/2015 10:15 PM 01/09/2015  5:25 PM DNR [id number]  Oralia Manis, MD Inpatient   07/04/2014 10:03 AM 07/05/2014  6:48 PM DNR [id number]  Altamese Dilling, MD Inpatient    Advance Directive Documentation        Most Recent Value   Type of Advance Directive  Out of facility DNR (pink MOST or yellow form)   Pre-existing out of facility DNR order (yellow form or pink MOST form)  Yellow form placed in chart (order not valid for inpatient use)   "MOST" Form in Place?        TOTAL TIME TAKING CARE OF THIS PATIENT: 38 minutes.    Enid Baas M.D on 08/29/2015 at 11:39 AM  Between 7am to 6pm - Pager - [phone]  After 6pm go to [email or web address] - password [email or web address] [facility]  [facility]  Office  [phone]  CC: Primary care physician; Lyndon Code, MD

## 2015-08-29 NOTE — Progress Notes (Signed)
Physical Therapy Treatment Patient Details Name: Darrell Mcconnell Mcconnell MRN: [id number] DOB: 14-Sep-1935 Today's Date: 08/29/2015    History of Present Illness Darrell Mcconnell is a 79yo white male [facility] tachycardia, hypoxia, and fever. PMH includes dementia, blindness from glaucoma, COPD (Chronic O2 at home), CHF, anxiety, HTN, Afib. Pt reports he lives in a facility x4 years, and performs limited AMB with RW but this needs confirmation from caregivers.    PT Comments    Pt in bed ready for session.  Pt was able to transition to edge of bed with rail.  No loss of balance while sitting edge of bed.  HR at rest 83.  He was able to ambulate 60' x 2 with min guard and occasional min assist to navigate walker due to vision deficits.  HR 101 after gait with O2 at 2lpm and 98% O2 sats.  Pt did well with mobility today and was motivated to participate in session.  Chose to remain up in chair at end of session.  Sitter in room.   Follow Up Recommendations  Home health PT;Supervision/Assistance - 24 hour     Equipment Recommendations  Rolling walker with 5" wheels    Recommendations for Other Services       Precautions / Restrictions Precautions Precautions: Fall Restrictions Weight Bearing Restrictions: No    Mobility  Bed Mobility Overal bed mobility: Needs Assistance Bed Mobility: Supine to Sit     Supine to sit: Supervision     General bed mobility comments: uses hand rail, increased time  Transfers Overall transfer level: Needs assistance Equipment used: Rolling walker (2 wheeled) Transfers: Sit to/from Stand Sit to Stand: Min guard         General transfer comment: No physical assitance required, cuing for hand placement, prolonged time to complete. No buckling or loss of balance noted.   Ambulation/Gait Ambulation/Gait assistance: Min guard;Min assist (assist to navigate walker and manage O2) Ambulation Distance (Feet): 60 Feet (x 2) Assistive device: Rolling walker (2  wheeled) Gait Pattern/deviations: Narrow base of support         Stairs            Wheelchair Mobility    Modified Rankin (Stroke Patients Only)       Balance Overall balance assessment: Needs assistance Sitting-balance support: Feet supported Sitting balance-Leahy Scale: Good     Standing balance support: Bilateral upper extremity supported Standing balance-Leahy Scale: Fair                      Cognition Arousal/Alertness: Awake/alert Behavior During Therapy: WFL for tasks assessed/performed Overall Cognitive Status: History of cognitive impairments - at baseline       Memory: Decreased short-term memory              Exercises      General Comments        Pertinent Vitals/Pain Pain Assessment: No/denies pain    Home Living                      Prior Function            PT Goals (current goals can now be found in the care plan section) Progress towards PT goals: Progressing toward goals    Frequency  Min 2X/week    PT Plan Current plan remains appropriate    Co-evaluation             End of Session Equipment Utilized During  Treatment: Oxygen;Gait belt Activity Tolerance: Patient tolerated treatment well;Patient limited by fatigue Patient left: in chair;with chair alarm set;with call bell/phone within reach;with nursing/sitter in room;with family/visitor present     Time: [id number] PT Time Calculation (min) (ACUTE ONLY): 23 min  Charges:  $Gait Training: 23-37 mins                    G Codes:      Danielle DessSarah Ules Marsala, PTA 08/29/2015, 9:41 AM

## 2015-08-29 NOTE — Progress Notes (Signed)
Pt discharged back to assisted living facility staff member. Left via w/c with o2 2liters portable

## 2015-08-29 NOTE — Progress Notes (Signed)
Patient is medically stable for D/C back to [facility]. Per Haywood LassoLynette ALF administrator she can pick patient up today and will bring his portable tank. Per Haywood LassoLynette there is a problem with the oxygen concentrator and she has put a call into [facility] who reported that they will be out at the ALF between 1 and 5 pm today. Haywood LassoLynette has a full oxygen tank and per [facility] Rep that should last around 4 hours. Clinical Child psychotherapistocial Worker (CSW) prepared D/C packet. CSW contacted patient's son [name] and left him a voicemail making him aware of above. RN aware of above. Please reconsult if future social work needs arise. CSW signing off.   Jetta LoutBailey Morgan, LCSW [phone]

## 2015-08-29 NOTE — Progress Notes (Signed)
Pt with constipation.no bm since admission. md ordered dulcolax supp pr qd prn

## 2015-08-29 NOTE — [place] FL2 (Signed)
[place] MEDICAID FL2 LEVEL OF CARE SCREENING TOOL     IDENTIFICATION  Patient Name: Darrell StandardClyde Pollett Birthdate: Nov 14, 1935 Sex: male Admission Date (Current Location): 08/26/2015  [place]   Facility and Address:  [facility], [address], [place], [place] [address]      Provider Number: [id number]  Attending Physician Name and Address:  Enid Baasadhika Kalisetti, MD  Relative Name and Phone Number:       Current Level of Care: Hospital Recommended Level of Care: Assisted Living Facility Prior Approval Number:    Date Approved/Denied:   PASRR Number: [id number]  Discharge Plan: Domiciliary (Rest home)    Current Diagnoses: Patient Active Problem List   Diagnosis Date Noted  . Sepsis (HCC) 08/26/2015  . Pressure ulcer 08/26/2015  . Chest pain 06/03/2015  . Chronic systolic CHF (congestive heart failure) (HCC) 01/04/2015  . GERD (gastroesophageal reflux disease) 01/04/2015  . HTN (hypertension) 01/04/2015  . Anxiety 01/04/2015  . Chronic a-fib (HCC) 01/04/2015  . Dementia 01/04/2015  . COPD (chronic obstructive pulmonary disease) (HCC) 01/04/2015  . BPH (benign prostatic hyperplasia) 01/04/2015  . HCAP (healthcare-associated pneumonia) 07/04/2014  . COPD exacerbation (HCC) 07/04/2014  . Aneurysm of aorta (HCC) 07/04/2014    Orientation RESPIRATION BLADDER Height & Weight     Self  Oxygen 2 liters  Continent Weight: 154 lb (69.854 kg) Height:  5\' 9"  (175.3 cm)  BEHAVIORAL SYMPTOMS/MOOD NEUROLOGICAL BOWEL NUTRITION STATUS      Continent Diet (Soft)  AMBULATORY STATUS COMMUNICATION OF NEEDS Skin   Limited Assist Verbally Normal                       Personal Care Assistance Level of Assistance  Bathing, Feeding, Dressing Bathing Assistance: Limited assistance Feeding assistance: Independent Dressing Assistance: Limited assistance     Functional Limitations Info  Sight, Hearing, Speech Sight Info:  Impaired Hearing Info: Impaired Speech Info: Adequate    SPECIAL CARE FACTORS FREQUENCY   PT and RN home health via [facility]  2-3 times per week.                   Contractures      Additional Factors Info  Code Status, Allergies, Psychotropic Code Status Info: DNR Allergies Info: No known allergies Psychotropic Info: Medications        Discharge Medications: Please see discharge summary for a list of discharge medications. Current Discharge Medication List    START taking these medications   Details  alum & mag hydroxide-simeth (MAALOX/MYLANTA) 200-200-20 MG/5ML suspension Take 30 mLs by mouth every 6 (six) hours as needed for indigestion or heartburn. Qty: 355 mL, Refills: 0    azithromycin (ZITHROMAX) 500 MG tablet Take 1 tablet (500 mg total) by mouth daily. X 2 more days Qty: 2 tablet, Refills: 0    cefUROXime (CEFTIN) 500 MG tablet Take 1 tablet (500 mg total) by mouth 2 (two) times daily with a meal. X 4 more days Qty: 8 tablet, Refills: 0    Morphine Sulfate (MORPHINE CONCENTRATE) 10 MG/0.5ML SOLN concentrated solution Take 0.25 mLs (5 mg total) by mouth every 2 (two) hours as needed for shortness of breath. Qty: 15 mL, Refills: 0    predniSONE (STERAPRED UNI-PAK 21 TAB) 10 MG (21) TBPK tablet Take 1 tablet (10 mg total) by mouth daily. 5 tabs PO x 1 day 4 tabs PO x 1 day 3 tabs PO x 1 day 2 tabs  PO x 1 day 1 tab PO x 1 day and stop Qty: 15 tablet, Refills: 0    risperiDONE (RISPERDAL M-TABS) 0.5 MG disintegrating tablet Take 1 tablet (0.5 mg total) by mouth 2 (two) times daily. Qty: 40 tablet, Refills: 0      CONTINUE these medications which have CHANGED   Details  ALPRAZolam (XANAX) 0.25 MG tablet Take 1 tablet (0.25 mg total) by mouth every 8 (eight) hours as needed for anxiety. Qty: 25 tablet, Refills: 0    !! cholecalciferol 400 units tablet Take 1 tablet (400 Units total) by mouth daily. Qty: 30 each,  Refills: 0    furosemide (LASIX) 20 MG tablet Take 1 tablet (20 mg total) by mouth every morning. Qty: 30 tablet, Refills: 2    metoprolol tartrate (LOPRESSOR) 25 MG tablet Take 1 tablet (25 mg total) by mouth 2 (two) times daily. Qty: 60 tablet, Refills: 2    !! - Potential duplicate medications found. Please discuss with provider.    CONTINUE these medications which have NOT CHANGED   Details  acetaminophen (TYLENOL) 650 MG CR tablet Take 650 mg by mouth 2 (two) times daily as needed for pain.    albuterol (PROVENTIL HFA;VENTOLIN HFA) 108 (90 Base) MCG/ACT inhaler Inhale 2 puffs into the lungs See admin instructions. Every 4 to 6 hours PRN for shortness of breath    aspirin EC 81 MG tablet Take 81 mg by mouth every evening.     bimatoprost (LUMIGAN) 0.01 % SOLN Place 1 drop into both eyes at bedtime.     brimonidine-timolol (COMBIGAN) 0.2-0.5 % ophthalmic solution Place 1 drop into both eyes 2 (two) times daily.    !! cholecalciferol (VITAMIN D) 400 UNITS TABS tablet Take 400 Units by mouth daily.    dicyclomine (BENTYL) 20 MG tablet Take 20 mg by mouth every 8 (eight) hours as needed for spasms.    digoxin (LANOXIN) 0.125 MG tablet Take 0.125 mg by mouth daily.    docusate sodium (COLACE) 100 MG capsule Take 100 mg by mouth 2 (two) times daily.    dorzolamide (TRUSOPT) 2 % ophthalmic solution Place 1 drop into both eyes 2 (two) times daily.    escitalopram (LEXAPRO) 10 MG tablet Take 15 mg by mouth daily.    finasteride (PROSCAR) 5 MG tablet Take 5 mg by mouth daily.    Fluticasone-Salmeterol (ADVAIR) 250-50 MCG/DOSE AEPB Inhale 1 puff into the lungs every 12 (twelve) hours.     hydrocortisone cream 1 % Apply 1 application topically as needed for itching.     levothyroxine (SYNTHROID, LEVOTHROID) 25 MCG tablet Take 25 mcg by mouth every morning.     lisinopril (PRINIVIL,ZESTRIL) 2.5 MG tablet Take 2.5 mg by mouth  daily.    meloxicam (MOBIC) 7.5 MG tablet Take 7.5 mg by mouth daily.    mirtazapine (REMERON) 30 MG tablet Take 30 mg by mouth at bedtime.    ondansetron (ZOFRAN) 4 MG tablet Take 4 mg by mouth 2 (two) times daily as needed for nausea or vomiting.    pantoprazole (PROTONIX) 40 MG tablet Take 40 mg by mouth daily.    polyethylene glycol (MIRALAX / GLYCOLAX) packet Take 17 g by mouth daily as needed for moderate constipation.    Prenatal Vit-Fe Fumarate-FA (PREPLUS) 27-1 MG TABS Take 1 tablet by mouth daily.    !! QUEtiapine (SEROQUEL) 25 MG tablet Take 25 mg by mouth 2 (two) times daily. At 12 pm and at 5 pm    !!  QUEtiapine (SEROQUEL) 50 MG tablet Take 50 mg by mouth at bedtime.    simethicone (MYLICON) 80 MG chewable tablet Chew 80 mg by mouth every 4 (four) hours as needed for flatulence.    Skin Protectants, Misc. (EUCERIN) cream Apply 1 application topically 2 (two) times daily. Pt applies to arms and legs.    sodium phosphate (FLEET) enema Place 1 enema rectally as needed (for constipation). follow package directions    ipratropium-albuterol (DUONEB) 0.5-2.5 (3) MG/3ML SOLN Take 3 mLs by nebulization 3 (three) times daily as needed (for shortness of breath/wheezing).            Relevant Imaging Results:  Relevant Lab Results:   Additional Information SSN:  [id number]  Haig Prophet, LCSW

## 2015-08-29 NOTE — Care Management Note (Signed)
Case Management Note  Patient Details  Name: Revonda StandardClyde Nicklaus MRN: [id number] Date of Birth: [birth year]  Subjective/Objective:                    Action/Plan: [facility]. Will sign off.   Expected Discharge Date:  08/26/15               Expected Discharge Plan:  Assisted Living / Rest Home  In-House Referral:  Clinical Social Work  Discharge planning Services  CM Consult  Post Acute Care Choice:    Choice offered to:  NA  DME Arranged:    DME Agency:  [facility]  HH Arranged:  [facility]  Status of Service:  Completed, signed off  If discussed at Long Length of Stay Meetings, dates discussed:    Additional Comments:  [name], Kaelynn Igo L, RN 08/29/2015, 11:41 AM

## 2015-08-30 DIAGNOSIS — I10 Essential (primary) hypertension: Secondary | ICD-10-CM | POA: Diagnosis not present

## 2015-08-30 DIAGNOSIS — A419 Sepsis, unspecified organism: Secondary | ICD-10-CM | POA: Diagnosis not present

## 2015-08-30 DIAGNOSIS — I5023 Acute on chronic systolic (congestive) heart failure: Secondary | ICD-10-CM | POA: Diagnosis not present

## 2015-08-30 DIAGNOSIS — I482 Chronic atrial fibrillation: Secondary | ICD-10-CM | POA: Diagnosis not present

## 2015-08-30 DIAGNOSIS — J441 Chronic obstructive pulmonary disease with (acute) exacerbation: Secondary | ICD-10-CM | POA: Diagnosis not present

## 2015-08-30 DIAGNOSIS — J189 Pneumonia, unspecified organism: Secondary | ICD-10-CM | POA: Diagnosis not present

## 2015-08-30 DIAGNOSIS — Z7982 Long term (current) use of aspirin: Secondary | ICD-10-CM | POA: Diagnosis not present

## 2015-08-30 DIAGNOSIS — Z7951 Long term (current) use of inhaled steroids: Secondary | ICD-10-CM | POA: Diagnosis not present

## 2015-08-30 DIAGNOSIS — Z9981 Dependence on supplemental oxygen: Secondary | ICD-10-CM | POA: Diagnosis not present

## 2015-08-31 LAB — CULTURE, BLOOD (ROUTINE X 2)
CULTURE: NO GROWTH
CULTURE: NO GROWTH

## 2015-09-02 DIAGNOSIS — I10 Essential (primary) hypertension: Secondary | ICD-10-CM | POA: Diagnosis not present

## 2015-09-02 DIAGNOSIS — F0390 Unspecified dementia without behavioral disturbance: Secondary | ICD-10-CM | POA: Diagnosis not present

## 2015-09-02 DIAGNOSIS — I482 Chronic atrial fibrillation: Secondary | ICD-10-CM | POA: Diagnosis not present

## 2015-09-02 DIAGNOSIS — Z9981 Dependence on supplemental oxygen: Secondary | ICD-10-CM | POA: Diagnosis not present

## 2015-09-02 DIAGNOSIS — Z7982 Long term (current) use of aspirin: Secondary | ICD-10-CM | POA: Diagnosis not present

## 2015-09-02 DIAGNOSIS — F418 Other specified anxiety disorders: Secondary | ICD-10-CM | POA: Diagnosis not present

## 2015-09-02 DIAGNOSIS — Z7951 Long term (current) use of inhaled steroids: Secondary | ICD-10-CM | POA: Diagnosis not present

## 2015-09-02 DIAGNOSIS — J189 Pneumonia, unspecified organism: Secondary | ICD-10-CM | POA: Diagnosis not present

## 2015-09-02 DIAGNOSIS — A419 Sepsis, unspecified organism: Secondary | ICD-10-CM | POA: Diagnosis not present

## 2015-09-02 DIAGNOSIS — J441 Chronic obstructive pulmonary disease with (acute) exacerbation: Secondary | ICD-10-CM | POA: Diagnosis not present

## 2015-09-02 DIAGNOSIS — I5023 Acute on chronic systolic (congestive) heart failure: Secondary | ICD-10-CM | POA: Diagnosis not present

## 2015-09-03 DIAGNOSIS — Z7982 Long term (current) use of aspirin: Secondary | ICD-10-CM | POA: Diagnosis not present

## 2015-09-03 DIAGNOSIS — I10 Essential (primary) hypertension: Secondary | ICD-10-CM | POA: Diagnosis not present

## 2015-09-03 DIAGNOSIS — J449 Chronic obstructive pulmonary disease, unspecified: Secondary | ICD-10-CM | POA: Diagnosis not present

## 2015-09-03 DIAGNOSIS — I482 Chronic atrial fibrillation: Secondary | ICD-10-CM | POA: Diagnosis not present

## 2015-09-03 DIAGNOSIS — F418 Other specified anxiety disorders: Secondary | ICD-10-CM | POA: Diagnosis not present

## 2015-09-03 DIAGNOSIS — I5023 Acute on chronic systolic (congestive) heart failure: Secondary | ICD-10-CM | POA: Diagnosis not present

## 2015-09-03 DIAGNOSIS — A419 Sepsis, unspecified organism: Secondary | ICD-10-CM | POA: Diagnosis not present

## 2015-09-03 DIAGNOSIS — Z9981 Dependence on supplemental oxygen: Secondary | ICD-10-CM | POA: Diagnosis not present

## 2015-09-03 DIAGNOSIS — Z7951 Long term (current) use of inhaled steroids: Secondary | ICD-10-CM | POA: Diagnosis not present

## 2015-09-03 DIAGNOSIS — J189 Pneumonia, unspecified organism: Secondary | ICD-10-CM | POA: Diagnosis not present

## 2015-09-03 DIAGNOSIS — F0390 Unspecified dementia without behavioral disturbance: Secondary | ICD-10-CM | POA: Diagnosis not present

## 2015-09-03 DIAGNOSIS — J441 Chronic obstructive pulmonary disease with (acute) exacerbation: Secondary | ICD-10-CM | POA: Diagnosis not present

## 2015-09-05 DIAGNOSIS — J189 Pneumonia, unspecified organism: Secondary | ICD-10-CM | POA: Diagnosis not present

## 2015-09-05 DIAGNOSIS — F418 Other specified anxiety disorders: Secondary | ICD-10-CM | POA: Diagnosis not present

## 2015-09-05 DIAGNOSIS — Z9981 Dependence on supplemental oxygen: Secondary | ICD-10-CM | POA: Diagnosis not present

## 2015-09-05 DIAGNOSIS — I482 Chronic atrial fibrillation: Secondary | ICD-10-CM | POA: Diagnosis not present

## 2015-09-05 DIAGNOSIS — I10 Essential (primary) hypertension: Secondary | ICD-10-CM | POA: Diagnosis not present

## 2015-09-05 DIAGNOSIS — Z7982 Long term (current) use of aspirin: Secondary | ICD-10-CM | POA: Diagnosis not present

## 2015-09-05 DIAGNOSIS — J441 Chronic obstructive pulmonary disease with (acute) exacerbation: Secondary | ICD-10-CM | POA: Diagnosis not present

## 2015-09-05 DIAGNOSIS — F0390 Unspecified dementia without behavioral disturbance: Secondary | ICD-10-CM | POA: Diagnosis not present

## 2015-09-05 DIAGNOSIS — A419 Sepsis, unspecified organism: Secondary | ICD-10-CM | POA: Diagnosis not present

## 2015-09-05 DIAGNOSIS — Z7951 Long term (current) use of inhaled steroids: Secondary | ICD-10-CM | POA: Diagnosis not present

## 2015-09-05 DIAGNOSIS — I5023 Acute on chronic systolic (congestive) heart failure: Secondary | ICD-10-CM | POA: Diagnosis not present

## 2015-09-08 ENCOUNTER — Inpatient Hospital Stay
Admission: EM | Admit: 2015-09-08 | Discharge: 2015-09-11 | DRG: 190 | Disposition: A | Discharge status: Skilled Nursing Facility | Payer: Medicare Other | Attending: Internal Medicine | Admitting: Internal Medicine

## 2015-09-08 ENCOUNTER — Emergency Department: Payer: Medicare Other

## 2015-09-08 ENCOUNTER — Encounter: Payer: Self-pay | Admitting: Radiology

## 2015-09-08 DIAGNOSIS — Z9989 Dependence on other enabling machines and devices: Secondary | ICD-10-CM | POA: Diagnosis not present

## 2015-09-08 DIAGNOSIS — Z515 Encounter for palliative care: Secondary | ICD-10-CM | POA: Insufficient documentation

## 2015-09-08 DIAGNOSIS — Z809 Family history of malignant neoplasm, unspecified: Secondary | ICD-10-CM | POA: Diagnosis not present

## 2015-09-08 DIAGNOSIS — E039 Hypothyroidism, unspecified: Secondary | ICD-10-CM | POA: Diagnosis not present

## 2015-09-08 DIAGNOSIS — N400 Enlarged prostate without lower urinary tract symptoms: Secondary | ICD-10-CM | POA: Diagnosis not present

## 2015-09-08 DIAGNOSIS — F419 Anxiety disorder, unspecified: Secondary | ICD-10-CM | POA: Diagnosis present

## 2015-09-08 DIAGNOSIS — I5042 Chronic combined systolic (congestive) and diastolic (congestive) heart failure: Secondary | ICD-10-CM | POA: Diagnosis present

## 2015-09-08 DIAGNOSIS — J9819 Other pulmonary collapse: Secondary | ICD-10-CM | POA: Diagnosis not present

## 2015-09-08 DIAGNOSIS — I482 Chronic atrial fibrillation, unspecified: Secondary | ICD-10-CM | POA: Diagnosis present

## 2015-09-08 DIAGNOSIS — I158 Other secondary hypertension: Secondary | ICD-10-CM | POA: Diagnosis not present

## 2015-09-08 DIAGNOSIS — J440 Chronic obstructive pulmonary disease with acute lower respiratory infection: Principal | ICD-10-CM | POA: Diagnosis present

## 2015-09-08 DIAGNOSIS — Z8249 Family history of ischemic heart disease and other diseases of the circulatory system: Secondary | ICD-10-CM

## 2015-09-08 DIAGNOSIS — T17890A Other foreign object in other parts of respiratory tract causing asphyxiation, initial encounter: Secondary | ICD-10-CM | POA: Diagnosis present

## 2015-09-08 DIAGNOSIS — Z87891 Personal history of nicotine dependence: Secondary | ICD-10-CM

## 2015-09-08 DIAGNOSIS — Z66 Do not resuscitate: Secondary | ICD-10-CM | POA: Diagnosis not present

## 2015-09-08 DIAGNOSIS — I480 Paroxysmal atrial fibrillation: Secondary | ICD-10-CM | POA: Diagnosis present

## 2015-09-08 DIAGNOSIS — Z8679 Personal history of other diseases of the circulatory system: Secondary | ICD-10-CM

## 2015-09-08 DIAGNOSIS — R6889 Other general symptoms and signs: Secondary | ICD-10-CM | POA: Diagnosis not present

## 2015-09-08 DIAGNOSIS — J441 Chronic obstructive pulmonary disease with (acute) exacerbation: Secondary | ICD-10-CM | POA: Diagnosis present

## 2015-09-08 DIAGNOSIS — I5022 Chronic systolic (congestive) heart failure: Secondary | ICD-10-CM | POA: Diagnosis present

## 2015-09-08 DIAGNOSIS — K219 Gastro-esophageal reflux disease without esophagitis: Secondary | ICD-10-CM | POA: Diagnosis present

## 2015-09-08 DIAGNOSIS — R05 Cough: Secondary | ICD-10-CM | POA: Diagnosis not present

## 2015-09-08 DIAGNOSIS — R079 Chest pain, unspecified: Secondary | ICD-10-CM | POA: Diagnosis present

## 2015-09-08 DIAGNOSIS — I10 Essential (primary) hypertension: Secondary | ICD-10-CM | POA: Diagnosis present

## 2015-09-08 DIAGNOSIS — J9811 Atelectasis: Secondary | ICD-10-CM | POA: Diagnosis not present

## 2015-09-08 DIAGNOSIS — H540 Blindness, both eyes: Secondary | ICD-10-CM | POA: Diagnosis not present

## 2015-09-08 DIAGNOSIS — Z951 Presence of aortocoronary bypass graft: Secondary | ICD-10-CM | POA: Diagnosis not present

## 2015-09-08 DIAGNOSIS — Z9981 Dependence on supplemental oxygen: Secondary | ICD-10-CM

## 2015-09-08 DIAGNOSIS — F0391 Unspecified dementia with behavioral disturbance: Secondary | ICD-10-CM | POA: Diagnosis present

## 2015-09-08 DIAGNOSIS — J9809 Other diseases of bronchus, not elsewhere classified: Secondary | ICD-10-CM | POA: Diagnosis not present

## 2015-09-08 DIAGNOSIS — Z915 Personal history of self-harm: Secondary | ICD-10-CM

## 2015-09-08 DIAGNOSIS — F333 Major depressive disorder, recurrent, severe with psychotic symptoms: Secondary | ICD-10-CM | POA: Diagnosis present

## 2015-09-08 DIAGNOSIS — I509 Heart failure, unspecified: Secondary | ICD-10-CM | POA: Diagnosis not present

## 2015-09-08 DIAGNOSIS — J189 Pneumonia, unspecified organism: Secondary | ICD-10-CM | POA: Diagnosis present

## 2015-09-08 DIAGNOSIS — J90 Pleural effusion, not elsewhere classified: Secondary | ICD-10-CM | POA: Diagnosis not present

## 2015-09-08 DIAGNOSIS — R0781 Pleurodynia: Secondary | ICD-10-CM | POA: Diagnosis not present

## 2015-09-08 DIAGNOSIS — Z79899 Other long term (current) drug therapy: Secondary | ICD-10-CM

## 2015-09-08 DIAGNOSIS — Z7982 Long term (current) use of aspirin: Secondary | ICD-10-CM | POA: Diagnosis not present

## 2015-09-08 DIAGNOSIS — F03918 Unspecified dementia, unspecified severity, with other behavioral disturbance: Secondary | ICD-10-CM

## 2015-09-08 LAB — COMPREHENSIVE METABOLIC PANEL
ALT: 20 U/L (ref 17–63)
AST: 24 U/L (ref 15–41)
Albumin: 3.5 g/dL (ref 3.5–5.0)
Alkaline Phosphatase: 72 U/L (ref 38–126)
Anion gap: 8 (ref 5–15)
BUN: 20 mg/dL (ref 6–20)
CHLORIDE: 104 mmol/L (ref 101–111)
CO2: 28 mmol/L (ref 22–32)
CREATININE: 0.78 mg/dL (ref 0.61–1.24)
Calcium: 8.9 mg/dL (ref 8.9–10.3)
GFR calc non Af Amer: >60 mL/min (ref >60)
Glucose, Bld: 91 mg/dL (ref 65–99)
POTASSIUM: 3.8 mmol/L (ref 3.5–5.1)
SODIUM: 140 mmol/L (ref 135–145)
Total Bilirubin: 0.7 mg/dL (ref 0.3–1.2)
Total Protein: 6.5 g/dL (ref 6.5–8.1)

## 2015-09-08 LAB — TROPONIN I: TROPONIN I: 0.03 ng/mL — AB (ref <0.03)

## 2015-09-08 LAB — CBC WITH DIFFERENTIAL/PLATELET
Basophils Absolute: 0.1 10*3/uL (ref 0–0.1)
Basophils Relative: 1 %
EOS ABS: 0.5 10*3/uL (ref 0–0.7)
Eosinophils Relative: 5 %
HEMATOCRIT: 33.1 % — AB (ref 40.0–52.0)
HEMOGLOBIN: 11.1 g/dL — AB (ref 13.0–18.0)
LYMPHS ABS: 1.2 10*3/uL (ref 1.0–3.6)
LYMPHS PCT: 14 %
MCH: 27.7 pg (ref 26.0–34.0)
MCHC: 33.5 g/dL (ref 32.0–36.0)
MCV: 82.6 fL (ref 80.0–100.0)
MONOS PCT: 11 %
Monocytes Absolute: 0.9 10*3/uL (ref 0.2–1.0)
NEUTROS PCT: 69 %
Neutro Abs: 6 10*3/uL (ref 1.4–6.5)
Platelets: 199 10*3/uL (ref 150–440)
RBC: 4 MIL/uL — AB (ref 4.40–5.90)
RDW: 17.6 % — ABNORMAL HIGH (ref 11.5–14.5)
WBC: 8.7 10*3/uL (ref 3.8–10.6)

## 2015-09-08 LAB — BRAIN NATRIURETIC PEPTIDE: B Natriuretic Peptide: 254 pg/mL — ABNORMAL HIGH (ref 0.0–100.0)

## 2015-09-08 MED ORDER — SODIUM CHLORIDE 0.9% FLUSH
3.0000 mL | Freq: Two times a day (BID) | INTRAVENOUS | Status: DC
  Administered 2015-09-09 – 2015-09-11 (×4): 3 mL via INTRAVENOUS

## 2015-09-08 MED ORDER — DIGOXIN 125 MCG PO TABS
0.1250 mg | ORAL_TABLET | Freq: Every day | ORAL | Status: DC
  Administered 2015-09-09 – 2015-09-11 (×3): 0.125 mg via ORAL
  Filled 2015-09-08 (×3): qty 1

## 2015-09-08 MED ORDER — ACETAMINOPHEN 325 MG PO TABS: 650.0000 mg | ORAL_TABLET | Freq: Four times a day (QID) | ORAL | Status: DC | PRN

## 2015-09-08 MED ORDER — DICYCLOMINE HCL 20 MG PO TABS
20.0000 mg | ORAL_TABLET | Freq: Three times a day (TID) | ORAL | Status: DC | PRN
  Filled 2015-09-08: qty 1

## 2015-09-08 MED ORDER — AZITHROMYCIN 500 MG PO TABS
500.0000 mg | ORAL_TABLET | Freq: Every day | ORAL | Status: DC
  Administered 2015-09-08 – 2015-09-09 (×2): 500 mg via ORAL
  Filled 2015-09-08 (×2): qty 1

## 2015-09-08 MED ORDER — PANTOPRAZOLE SODIUM 40 MG PO TBEC
40.0000 mg | DELAYED_RELEASE_TABLET | Freq: Every day | ORAL | Status: DC
  Administered 2015-09-08 – 2015-09-11 (×4): 40 mg via ORAL
  Filled 2015-09-08 (×4): qty 1

## 2015-09-08 MED ORDER — MOMETASONE FURO-FORMOTEROL FUM 200-5 MCG/ACT IN AERO
2.0000 | INHALATION_SPRAY | Freq: Two times a day (BID) | RESPIRATORY_TRACT | Status: DC
  Administered 2015-09-09: 2 via RESPIRATORY_TRACT
  Filled 2015-09-08: qty 8.8

## 2015-09-08 MED ORDER — ESCITALOPRAM OXALATE 10 MG PO TABS
15.0000 mg | ORAL_TABLET | Freq: Every day | ORAL | Status: DC
  Administered 2015-09-09 – 2015-09-10 (×2): 15 mg via ORAL
  Filled 2015-09-08 (×2): qty 2

## 2015-09-08 MED ORDER — METOPROLOL TARTRATE 25 MG PO TABS
12.5000 mg | ORAL_TABLET | Freq: Two times a day (BID) | ORAL | Status: DC
  Administered 2015-09-08 – 2015-09-11 (×6): 12.5 mg via ORAL
  Filled 2015-09-08 (×6): qty 1

## 2015-09-08 MED ORDER — LISINOPRIL 5 MG PO TABS
2.5000 mg | ORAL_TABLET | Freq: Every day | ORAL | Status: DC
  Administered 2015-09-09 – 2015-09-11 (×3): 2.5 mg via ORAL
  Filled 2015-09-08 (×3): qty 1

## 2015-09-08 MED ORDER — LEVOTHYROXINE SODIUM 50 MCG PO TABS
25.0000 ug | ORAL_TABLET | ORAL | Status: DC
Start: 2015-09-09 — End: 2015-09-11
  Administered 2015-09-09 – 2015-09-11 (×3): 25 ug via ORAL
  Filled 2015-09-08 (×2): qty 2
  Filled 2015-09-08: qty 1

## 2015-09-08 MED ORDER — IPRATROPIUM-ALBUTEROL 0.5-2.5 (3) MG/3ML IN SOLN: 3.0000 mL | RESPIRATORY_TRACT | Status: DC | PRN

## 2015-09-08 MED ORDER — ASPIRIN EC 81 MG PO TBEC
81.0000 mg | DELAYED_RELEASE_TABLET | Freq: Every evening | ORAL | Status: DC
  Administered 2015-09-08 – 2015-09-09 (×2): 81 mg via ORAL
  Filled 2015-09-08 (×3): qty 1

## 2015-09-08 MED ORDER — FINASTERIDE 5 MG PO TABS
5.0000 mg | ORAL_TABLET | Freq: Every day | ORAL | Status: DC
  Administered 2015-09-09 – 2015-09-11 (×3): 5 mg via ORAL
  Filled 2015-09-08 (×3): qty 1

## 2015-09-08 MED ORDER — MORPHINE SULFATE (CONCENTRATE) 10 MG/0.5ML PO SOLN: 5.0000 mg | ORAL | Status: DC | PRN

## 2015-09-08 MED ORDER — FUROSEMIDE 40 MG PO TABS
40.0000 mg | ORAL_TABLET | ORAL | Status: DC
  Administered 2015-09-09 – 2015-09-11 (×3): 40 mg via ORAL
  Filled 2015-09-08 (×3): qty 1

## 2015-09-08 MED ORDER — RISPERIDONE 0.5 MG PO TBDP
0.5000 mg | ORAL_TABLET | Freq: Two times a day (BID) | ORAL | Status: DC
  Administered 2015-09-08 – 2015-09-10 (×5): 0.5 mg via ORAL
  Filled 2015-09-08 (×5): qty 1

## 2015-09-08 MED ORDER — QUETIAPINE FUMARATE 25 MG PO TABS
25.0000 mg | ORAL_TABLET | Freq: Two times a day (BID) | ORAL | Status: DC
  Administered 2015-09-09 – 2015-09-11 (×3): 25 mg via ORAL
  Filled 2015-09-08 (×3): qty 1

## 2015-09-08 MED ORDER — BRIMONIDINE TARTRATE-TIMOLOL 0.2-0.5 % OP SOLN: 1.0000 [drp] | Freq: Two times a day (BID) | OPHTHALMIC | Status: DC

## 2015-09-08 MED ORDER — QUETIAPINE FUMARATE 25 MG PO TABS
50.0000 mg | ORAL_TABLET | Freq: Every day | ORAL | Status: DC
  Administered 2015-09-08 – 2015-09-10 (×3): 50 mg via ORAL
  Filled 2015-09-08 (×3): qty 2

## 2015-09-08 MED ORDER — IOPAMIDOL (ISOVUE-300) INJECTION 61%
75.0000 mL | Freq: Once | INTRAVENOUS | Status: AC | PRN
  Administered 2015-09-08: 75 mL via INTRAVENOUS

## 2015-09-08 MED ORDER — ACETAMINOPHEN 650 MG RE SUPP: 650.0000 mg | Freq: Four times a day (QID) | RECTAL | Status: DC | PRN

## 2015-09-08 MED ORDER — MIRTAZAPINE 15 MG PO TABS
30.0000 mg | ORAL_TABLET | Freq: Every day | ORAL | Status: DC
  Administered 2015-09-08: 30 mg via ORAL
  Filled 2015-09-08: qty 2

## 2015-09-08 MED ORDER — MORPHINE SULFATE (PF) 2 MG/ML IV SOLN
2.0000 mg | INTRAVENOUS | Status: DC | PRN
  Administered 2015-09-11: 2 mg via INTRAVENOUS
  Filled 2015-09-08: qty 1

## 2015-09-08 MED ORDER — ONDANSETRON HCL 4 MG PO TABS: 4.0000 mg | ORAL_TABLET | Freq: Four times a day (QID) | ORAL | Status: DC | PRN

## 2015-09-08 MED ORDER — BRIMONIDINE TARTRATE 0.2 % OP SOLN
1.0000 [drp] | Freq: Two times a day (BID) | OPHTHALMIC | Status: DC
  Administered 2015-09-09 – 2015-09-11 (×3): 1 [drp] via OPHTHALMIC
  Filled 2015-09-08: qty 5

## 2015-09-08 MED ORDER — TIMOLOL MALEATE 0.5 % OP SOLN
1.0000 [drp] | Freq: Two times a day (BID) | OPHTHALMIC | Status: DC
  Administered 2015-09-09 – 2015-09-11 (×3): 1 [drp] via OPHTHALMIC
  Filled 2015-09-08: qty 5

## 2015-09-08 MED ORDER — DORZOLAMIDE HCL 2 % OP SOLN
1.0000 [drp] | Freq: Two times a day (BID) | OPHTHALMIC | Status: DC
  Administered 2015-09-09 – 2015-09-11 (×3): 1 [drp] via OPHTHALMIC
  Filled 2015-09-08: qty 10

## 2015-09-08 MED ORDER — ENOXAPARIN SODIUM 40 MG/0.4ML ~~LOC~~ SOLN
40.0000 mg | SUBCUTANEOUS | Status: DC
  Administered 2015-09-09 – 2015-09-10 (×2): 40 mg via SUBCUTANEOUS
  Filled 2015-09-08 (×3): qty 0.4

## 2015-09-08 MED ORDER — ALPRAZOLAM 0.5 MG PO TABS
0.2500 mg | ORAL_TABLET | Freq: Three times a day (TID) | ORAL | Status: DC | PRN
  Administered 2015-09-10: 0.25 mg via ORAL
  Filled 2015-09-08: qty 1

## 2015-09-08 MED ORDER — OXYCODONE HCL 5 MG PO TABS: 5.0000 mg | ORAL_TABLET | ORAL | Status: DC | PRN

## 2015-09-08 MED ORDER — LATANOPROST 0.005 % OP SOLN
1.0000 [drp] | Freq: Every day | OPHTHALMIC | Status: DC
Start: 2015-09-08 — End: 2015-09-11
  Administered 2015-09-10: 1 [drp] via OPHTHALMIC
  Filled 2015-09-08: qty 2.5

## 2015-09-08 MED ORDER — ONDANSETRON HCL 4 MG/2ML IJ SOLN: 4.0000 mg | Freq: Four times a day (QID) | INTRAMUSCULAR | Status: DC | PRN

## 2015-09-08 NOTE — H&P (Signed)
[facility] [place] [facility]   PATIENT NAME: Darrell Mcconnell    MR#:  [id number]  DATE OF BIRTH:  10-Jul-1935  DATE OF ADMISSION:  09/08/2015  PRIMARY CARE PHYSICIAN: Lyndon CodeKHAN, FOZIA M, MD   REQUESTING/REFERRING PHYSICIAN: Alphonzo LemmingsMcShane, MD  CHIEF COMPLAINT:   Chief Complaint  Patient presents with  . Cough  . Pleurisy    HISTORY OF PRESENT ILLNESS:  Darrell Mcconnell  is a 80 y.o. male who presents with Several days of increasing cough with shortness of breath. Patient came in to the ED tonight for evaluation and was found to have a very small collapse of his right lower lobe on CT scan question of some potential mucoid plugging. He was also noted to likely be in COPD exacerbation. Patient does state that he has had significant increase in his cough recently, with some episodes of forceful cough. He is having some sputum production, an increase from his baseline. He has had some sharp chest pains associated with all this, and his troponin was borderline positive in the ED today. Due to all the above hospitalists were called for admission and further evaluation.  PAST MEDICAL HISTORY:   Past Medical History  Diagnosis Date  . Chronic systolic CHF (congestive heart failure) (HCC)   . COPD (chronic obstructive pulmonary disease) (HCC)   . Aorta aneurysm (HCC)   . Asthma   . Hypertension   . Anxiety   . A-fib (HCC)   . Depression   . Hypothyroidism   . GERD (gastroesophageal reflux disease)   . Dementia   . BPH (benign prostatic hyperplasia)     PAST SURGICAL HISTORY:   Past Surgical History  Procedure Laterality Date  . Coronary artery bypass graft    . Prostate surgery    . Abdominal aortic aneurysm repair      SOCIAL HISTORY:   Social History  Substance Use Topics  . Smoking status: Former Smoker -- 50 years  . Smokeless tobacco: Not on file  . Alcohol Use: No    FAMILY HISTORY:   Family History  Problem Relation Age of Onset  . CAD Father   .  Cancer Father     DRUG ALLERGIES:  No Known Allergies  MEDICATIONS AT HOME:   Prior to Admission medications   Medication Sig Start Date End Date Taking? Authorizing Provider  acetaminophen (TYLENOL) 650 MG CR tablet Take 650 mg by mouth 2 (two) times daily as needed for pain.   Yes Historical Provider, MD  albuterol (PROVENTIL HFA;VENTOLIN HFA) 108 (90 Base) MCG/ACT inhaler Inhale 2 puffs into the lungs See admin instructions. Every 4 to 6 hours PRN for shortness of breath   Yes Historical Provider, MD  ALPRAZolam (XANAX) 0.25 MG tablet Take 1 tablet (0.25 mg total) by mouth every 8 (eight) hours as needed for anxiety. 08/29/15  Yes Enid Baasadhika Kalisetti, MD  alum & mag hydroxide-simeth (MAALOX/MYLANTA) 200-200-20 MG/5ML suspension Take 30 mLs by mouth every 6 (six) hours as needed for indigestion or heartburn. 08/29/15  Yes Enid Baasadhika Kalisetti, MD  aspirin EC 81 MG tablet Take 81 mg by mouth every evening.    Yes Historical Provider, MD  bimatoprost (LUMIGAN) 0.01 % SOLN Place 1 drop into both eyes every evening.    Yes Historical Provider, MD  brimonidine-timolol (COMBIGAN) 0.2-0.5 % ophthalmic solution Place 1 drop into both eyes 2 (two) times daily.   Yes Historical Provider, MD  cholecalciferol 400 units tablet Take 1 tablet (400 Units total) by  mouth daily. 08/29/15  Yes Enid Baas, MD  dicyclomine (BENTYL) 20 MG tablet Take 20 mg by mouth every 8 (eight) hours as needed. For diarrhea.   Yes Historical Provider, MD  digoxin (LANOXIN) 0.125 MG tablet Take 0.125 mg by mouth daily.   Yes Historical Provider, MD  docusate sodium (COLACE) 100 MG capsule Take 100 mg by mouth 2 (two) times daily.   Yes Historical Provider, MD  dorzolamide (TRUSOPT) 2 % ophthalmic solution Place 1 drop into both eyes 2 (two) times daily.   Yes Historical Provider, MD  escitalopram (LEXAPRO) 10 MG tablet Take 15 mg by mouth daily.   Yes Historical Provider, MD  finasteride (PROSCAR) 5 MG tablet Take 5 mg by mouth  daily.   Yes Historical Provider, MD  Fluticasone-Salmeterol (ADVAIR) 250-50 MCG/DOSE AEPB Inhale 1 puff into the lungs every 12 (twelve) hours.    Yes Historical Provider, MD  furosemide (LASIX) 40 MG tablet Take 40 mg by mouth every morning.   Yes Historical Provider, MD  hydrocortisone cream 1 % Apply 1 application topically daily as needed for itching.    Yes Historical Provider, MD  ipratropium-albuterol (DUONEB) 0.5-2.5 (3) MG/3ML SOLN Take 3 mLs by nebulization 3 (three) times daily as needed (for shortness of breath/wheezing).    Yes Historical Provider, MD  levothyroxine (SYNTHROID, LEVOTHROID) 25 MCG tablet Take 25 mcg by mouth every morning.    Yes Historical Provider, MD  lisinopril (PRINIVIL,ZESTRIL) 2.5 MG tablet Take 2.5 mg by mouth daily.   Yes Historical Provider, MD  meloxicam (MOBIC) 7.5 MG tablet Take 7.5 mg by mouth daily.   Yes Historical Provider, MD  metoprolol tartrate (LOPRESSOR) 25 MG tablet Take 1 tablet (25 mg total) by mouth 2 (two) times daily. Patient taking differently: Take 12.5 mg by mouth 2 (two) times daily.  08/29/15  Yes Enid Baas, MD  mirtazapine (REMERON) 30 MG tablet Take 30 mg by mouth at bedtime.   Yes Historical Provider, MD  Morphine Sulfate (MORPHINE CONCENTRATE) 10 MG/0.5ML SOLN concentrated solution Take 0.25 mLs (5 mg total) by mouth every 2 (two) hours as needed for shortness of breath. 08/29/15  Yes Enid Baas, MD  ondansetron (ZOFRAN) 4 MG tablet Take 4 mg by mouth 2 (two) times daily as needed for nausea or vomiting.   Yes Historical Provider, MD  pantoprazole (PROTONIX) 40 MG tablet Take 40 mg by mouth daily.   Yes Historical Provider, MD  polyethylene glycol (MIRALAX / GLYCOLAX) packet Take 17 g by mouth daily as needed for moderate constipation.   Yes Historical Provider, MD  Prenatal Vit-Fe Fumarate-FA (PREPLUS) 27-1 MG TABS Take 1 tablet by mouth daily.   Yes Historical Provider, MD  QUEtiapine (SEROQUEL) 25 MG tablet Take 25 mg by  mouth 2 (two) times daily. At 12 pm and at 5 pm   Yes Historical Provider, MD  QUEtiapine (SEROQUEL) 50 MG tablet Take 50 mg by mouth at bedtime.   Yes Historical Provider, MD  risperiDONE (RISPERDAL M-TABS) 0.5 MG disintegrating tablet Take 1 tablet (0.5 mg total) by mouth 2 (two) times daily. 08/29/15  Yes Enid Baas, MD  simethicone (MYLICON) 80 MG chewable tablet Chew 80 mg by mouth every 4 (four) hours as needed for flatulence.   Yes Historical Provider, MD  Skin Protectants, Misc. (EUCERIN) cream Apply 1 application topically 2 (two) times daily. Pt applies to arms and legs.   Yes Historical Provider, MD  sodium phosphate (FLEET) enema Place 1 enema rectally as needed (for  constipation). follow package directions   Yes Historical Provider, MD  azithromycin (ZITHROMAX) 500 MG tablet Take 1 tablet (500 mg total) by mouth daily. X 2 more days 08/29/15   Enid Baas, MD  cefUROXime (CEFTIN) 500 MG tablet Take 1 tablet (500 mg total) by mouth 2 (two) times daily with a meal. X 4 more days 08/29/15   Enid Baas, MD  furosemide (LASIX) 20 MG tablet Take 1 tablet (20 mg total) by mouth every morning. 08/29/15   Enid Baas, MD  predniSONE (STERAPRED UNI-PAK 21 TAB) 10 MG (21) TBPK tablet Take 1 tablet (10 mg total) by mouth daily. 5 tabs PO x 1 day 4 tabs PO x 1 day 3 tabs PO x 1 day 2 tabs PO x 1 day 1 tab PO x 1 day and stop 08/29/15   Enid Baas, MD    REVIEW OF SYSTEMS:  Review of Systems  Constitutional: Negative for fever, chills, weight loss and malaise/fatigue.  HENT: Negative for ear pain, hearing loss and tinnitus.   Eyes: Negative for blurred vision, double vision, pain and redness.  Respiratory: Positive for cough, sputum production, shortness of breath and wheezing. Negative for hemoptysis.   Cardiovascular: Positive for chest pain. Negative for palpitations, orthopnea and leg swelling.  Gastrointestinal: Negative for nausea, vomiting, abdominal pain, diarrhea  and constipation.  Genitourinary: Negative for dysuria, frequency and hematuria.  Musculoskeletal: Negative for back pain, joint pain and neck pain.  Skin:       No acne, rash, or lesions  Neurological: Negative for dizziness, tremors, focal weakness and weakness.  Endo/Heme/Allergies: Negative for polydipsia. Does not bruise/bleed easily.  Psychiatric/Behavioral: Negative for depression. The patient is not nervous/anxious and does not have insomnia.      VITAL SIGNS:   Filed Vitals:   09/08/15 1830 09/08/15 1930 09/08/15 1945 09/08/15 2000  BP: 146/88 148/86  165/92  Pulse: 72 76 68   Temp:      TempSrc:      Resp: Height:      Weight:      SpO2: 98% 95% 90%    Wt Readings from Last 3 Encounters:  09/08/15 63.504 kg (140 lb)  08/26/15 69.854 kg (154 lb)  06/03/15 71.396 kg (157 lb 6.4 oz)    PHYSICAL EXAMINATION:  Physical Exam  Vitals reviewed. Constitutional: He is oriented to person, place, and time. He appears well-developed and well-nourished. No distress.  HENT:  Head: Normocephalic and atraumatic.  Mouth/Throat: Oropharynx is clear and moist.  Eyes: Conjunctivae and EOM are normal. Pupils are equal, round, and reactive to light. No scleral icterus.  Neck: Normal range of motion. Neck supple. No JVD present. No thyromegaly present.  Cardiovascular: Normal rate, regular rhythm and intact distal pulses.  Exam reveals no gallop and no friction rub.   No murmur heard. Respiratory: Effort normal. No respiratory distress. He has no wheezes. He has no rales.  GI: Soft. Bowel sounds are normal. He exhibits no distension. There is no tenderness.  Musculoskeletal: Normal range of motion. He exhibits no edema.  No arthritis, no gout  Lymphadenopathy:    He has no cervical adenopathy.  Neurological: He is alert and oriented to person, place, and time. No cranial nerve deficit.  No dysarthria, no aphasia  Skin: Skin is warm and dry. No rash noted. No erythema.   Psychiatric: He has a normal mood and affect. His behavior is normal. Judgment and thought content normal.    LABORATORY PANEL:  CBC  Recent Labs Lab 09/08/15 1742  WBC 8.7  HGB 11.1*  HCT 33.1*  PLT 199   ------------------------------------------------------------------------------------------------------------------  Chemistries   Recent Labs Lab 09/08/15 1742  NA 140  K 3.8  CL 104  CO2 28  GLUCOSE 91  BUN 20  CREATININE 0.78  CALCIUM 8.9  AST 24  ALT 20  ALKPHOS 72  BILITOT 0.7   ------------------------------------------------------------------------------------------------------------------  Cardiac Enzymes  Recent Labs Lab 09/08/15 1742  TROPONINI 0.03*   ------------------------------------------------------------------------------------------------------------------  RADIOLOGY:  Dg Chest 2 View  09/08/2015  CLINICAL DATA:  Patient with cough for unknown duration. EXAM: CHEST  2 VIEW COMPARISON:  Chest CT 01/08/2015; chest radiograph 08/28/2015. FINDINGS: Stable cardiac and mediastinal contours status post median sternotomy and CABG procedure. There is a persistent small right pleural effusion. Additionally there is suggestion of a more focal masslike opacity with the right lower hemi thorax. Multilevel degenerative disease of the thoracic spine. IMPRESSION: Persistent moderate right pleural effusion. Additionally there is suggestion of a more focal masslike opacity within the right lower hemi thorax which is nonspecific and may resent loculated fluid, atelectasis or underlying pulmonary mass. Recommend dedicated evaluation with chest CT. Electronically Signed   By: Annia Belt M.D.   On: 09/08/2015 17:37   Ct Chest W Contrast  09/08/2015  CLINICAL DATA:  Cough for few days. EXAM: CT CHEST WITH CONTRAST TECHNIQUE: Multidetector CT imaging of the chest was performed during intravenous contrast administration. CONTRAST:  75mL ISOVUE-300 IOPAMIDOL  (ISOVUE-300) INJECTION 61% COMPARISON:  Chest x-ray 08/28/2015, 09/08/2015; CT chest 02/12/2011, 09/16/2012 FINDINGS: Mediastinum/Lymph Nodes: No masses, pathologically enlarged lymph nodes, or other significant abnormality. Normal heart size. Prior CABG. Thoracic aortic atherosclerosis. Lungs/Pleura: Small bilateral pleural effusions, right greater than left. Partial right lower lobe collapse without a centrally obstructing mass. Small spiculated area of airspace disease in the right upper lobe likely reflecting an area of scarring unchanged compared with 02/12/2011. There are bilateral emphysematous changes. Upper abdomen: Suprarenal abdominal aortic aneurysm measuring 5.1 x 4.4 cm. Musculoskeletal: No chest wall mass or suspicious bone lesions identified. IMPRESSION: 1. Small bilateral pleural effusions, right greater than left. 2. Partial right lower lobe collapse without a centrally obstructing mass. This may be secondary to mucoid impaction. 3. Suprarenal abdominal aortic aneurysm measuring 5.1 x 4.4 cm. Electronically Signed   By: Elige Ko   On: 09/08/2015 19:41    EKG:   Orders placed or performed during the hospital encounter of 09/08/15  . ED EKG  . ED EKG    IMPRESSION AND PLAN:  Principal Problem:   COPD exacerbation (HCC) - We'll continue to nebs and azithromycin as well as oral prednisone. We will get a pulmonology consult to further evaluate his very small lung collapse and potential need for removal of any mucoid plugging. Active Problems:   Chronic systolic CHF (congestive heart failure) (HCC) - currently stable, continue home meds   HTN (hypertension) - continue home antihypertensives   Chronic a-fib (HCC) - continue home rate controlling medications   GERD (gastroesophageal reflux disease) - home dose PPI   BPH (benign prostatic hyperplasia) - continue home meds  All the records are reviewed and case discussed with ED provider. Management plans discussed with the patient  and/or family.  DVT PROPHYLAXIS: SubQ lovenox  GI PROPHYLAXIS: PPI  ADMISSION STATUS: Inpatient  CODE STATUS: DNR Code Status History    Date Active Date Inactive Code Status Order ID Comments User Context   08/26/2015 10:07 AM 08/29/2015  7:04 PM  DNR [id number]  Enedina Finner, MD Inpatient   06/03/2015  9:47 PM 06/04/2015  8:36 PM DNR [id number]  Houston Siren, MD Inpatient   02/20/2015  2:17 AM 02/20/2015  5:31 PM DNR [id number]  Oralia Manis, MD Inpatient   01/04/2015 10:15 PM 01/09/2015  5:25 PM DNR [id number]  Oralia Manis, MD Inpatient   07/04/2014 10:03 AM 07/05/2014  6:48 PM DNR [id number]  Altamese Dilling, MD Inpatient    Questions for Most Recent Historical Code Status (Order [id number])    Question Answer Comment   In the event of cardiac or respiratory ARREST Do not call a "code blue"    In the event of cardiac or respiratory ARREST Do not perform Intubation, CPR, defibrillation or ACLS    In the event of cardiac or respiratory ARREST Use medication by any route, position, wound care, and other measures to relive pain and suffering. May use oxygen, suction and manual treatment of airway obstruction as needed for comfort.     Advance Directive Documentation        Most Recent Value   Type of Advance Directive  Out of facility DNR (pink MOST or yellow form)   Pre-existing out of facility DNR order (yellow form or pink MOST form)     "MOST" Form in Place?        TOTAL TIME TAKING CARE OF THIS PATIENT: 45 minutes.    Taiwo Fish FIELDING 09/08/2015, 9:04 PM  [facility]  Office  [phone]  CC: Primary care physician; Lyndon Code, MD

## 2015-09-08 NOTE — ED Provider Notes (Signed)
[facility] Emergency Department Provider Note  ____________________________________________   I have reviewed the triage vital signs and the nursing notes.   HISTORY  Chief Complaint Cough and Pleurisy    HPI Darrell Mcconnell is a 80 y.o. male with a history of dementia, DO NOT RESUSCITATE, with COPD on 2 L home oxygen has had a cough for the last few days associated with pain when he coughs. Patient himself states he has no pain at this moment. He is at his baseline according to EMS. History is very limited second to patient baseline mental status. Level 5 chart caveat; no further history available due to patient status.     Past Medical History  Diagnosis Date  . Chronic systolic CHF (congestive heart failure) (HCC)   . COPD (chronic obstructive pulmonary disease) (HCC)   . Aorta aneurysm (HCC)   . Asthma   . Hypertension   . Anxiety   . A-fib (HCC)   . Depression   . Hypothyroidism   . GERD (gastroesophageal reflux disease)   . Dementia   . BPH (benign prostatic hyperplasia)     Patient Active Problem List   Diagnosis Date Noted  . Sepsis (HCC) 08/26/2015  . Pressure ulcer 08/26/2015  . Chest pain 06/03/2015  . Chronic systolic CHF (congestive heart failure) (HCC) 01/04/2015  . GERD (gastroesophageal reflux disease) 01/04/2015  . HTN (hypertension) 01/04/2015  . Anxiety 01/04/2015  . Chronic a-fib (HCC) 01/04/2015  . Dementia 01/04/2015  . COPD (chronic obstructive pulmonary disease) (HCC) 01/04/2015  . BPH (benign prostatic hyperplasia) 01/04/2015  . HCAP (healthcare-associated pneumonia) 07/04/2014  . COPD exacerbation (HCC) 07/04/2014  . Aneurysm of aorta (HCC) 07/04/2014    Past Surgical History  Procedure Laterality Date  . Coronary artery bypass graft    . Prostate surgery    . Abdominal aortic aneurysm repair      Current Outpatient Rx  Name  Route  Sig  Dispense  Refill  . acetaminophen (TYLENOL) 650 MG CR tablet   Oral  Take 650 mg by mouth 2 (two) times daily as needed for pain.         Marland Kitchen albuterol (PROVENTIL HFA;VENTOLIN HFA) 108 (90 Base) MCG/ACT inhaler   Inhalation   Inhale 2 puffs into the lungs See admin instructions. Every 4 to 6 hours PRN for shortness of breath         . ALPRAZolam (XANAX) 0.25 MG tablet   Oral   Take 1 tablet (0.25 mg total) by mouth every 8 (eight) hours as needed for anxiety.   25 tablet   0   . alum & mag hydroxide-simeth (MAALOX/MYLANTA) 200-200-20 MG/5ML suspension   Oral   Take 30 mLs by mouth every 6 (six) hours as needed for indigestion or heartburn.   355 mL   0   . aspirin EC 81 MG tablet   Oral   Take 81 mg by mouth every evening.          Marland Kitchen azithromycin (ZITHROMAX) 500 MG tablet   Oral   Take 1 tablet (500 mg total) by mouth daily. X 2 more days   2 tablet   0   . bimatoprost (LUMIGAN) 0.01 % SOLN   Both Eyes   Place 1 drop into both eyes at bedtime.          . brimonidine-timolol (COMBIGAN) 0.2-0.5 % ophthalmic solution   Both Eyes   Place 1 drop into both eyes 2 (two) times daily.         Marland Kitchen  cefUROXime (CEFTIN) 500 MG tablet   Oral   Take 1 tablet (500 mg total) by mouth 2 (two) times daily with a meal. X 4 more days   8 tablet   0   . cholecalciferol (VITAMIN D) 400 UNITS TABS tablet   Oral   Take 400 Units by mouth daily.         . cholecalciferol 400 units tablet   Oral   Take 1 tablet (400 Units total) by mouth daily.   30 each   0   . dicyclomine (BENTYL) 20 MG tablet   Oral   Take 20 mg by mouth every 8 (eight) hours as needed for spasms.         . digoxin (LANOXIN) 0.125 MG tablet   Oral   Take 0.125 mg by mouth daily.         Marland Kitchen docusate sodium (COLACE) 100 MG capsule   Oral   Take 100 mg by mouth 2 (two) times daily.         . dorzolamide (TRUSOPT) 2 % ophthalmic solution   Both Eyes   Place 1 drop into both eyes 2 (two) times daily.         Marland Kitchen escitalopram (LEXAPRO) 10 MG tablet   Oral   Take 15  mg by mouth daily.         . finasteride (PROSCAR) 5 MG tablet   Oral   Take 5 mg by mouth daily.         . Fluticasone-Salmeterol (ADVAIR) 250-50 MCG/DOSE AEPB   Inhalation   Inhale 1 puff into the lungs every 12 (twelve) hours.          . furosemide (LASIX) 20 MG tablet   Oral   Take 1 tablet (20 mg total) by mouth every morning.   30 tablet   2   . hydrocortisone cream 1 %   Topical   Apply 1 application topically as needed for itching.          Marland Kitchen ipratropium-albuterol (DUONEB) 0.5-2.5 (3) MG/3ML SOLN   Nebulization   Take 3 mLs by nebulization 3 (three) times daily as needed (for shortness of breath/wheezing).          Marland Kitchen levothyroxine (SYNTHROID, LEVOTHROID) 25 MCG tablet   Oral   Take 25 mcg by mouth every morning.          Marland Kitchen lisinopril (PRINIVIL,ZESTRIL) 2.5 MG tablet   Oral   Take 2.5 mg by mouth daily.         . meloxicam (MOBIC) 7.5 MG tablet   Oral   Take 7.5 mg by mouth daily.         . metoprolol tartrate (LOPRESSOR) 25 MG tablet   Oral   Take 1 tablet (25 mg total) by mouth 2 (two) times daily.   60 tablet   2   . mirtazapine (REMERON) 30 MG tablet   Oral   Take 30 mg by mouth at bedtime.         . Morphine Sulfate (MORPHINE CONCENTRATE) 10 MG/0.5ML SOLN concentrated solution   Oral   Take 0.25 mLs (5 mg total) by mouth every 2 (two) hours as needed for shortness of breath.   15 mL   0   . ondansetron (ZOFRAN) 4 MG tablet   Oral   Take 4 mg by mouth 2 (two) times daily as needed for nausea or vomiting.         . pantoprazole (PROTONIX)  40 MG tablet   Oral   Take 40 mg by mouth daily.         . polyethylene glycol (MIRALAX / GLYCOLAX) packet   Oral   Take 17 g by mouth daily as needed for moderate constipation.         . predniSONE (STERAPRED UNI-PAK 21 TAB) 10 MG (21) TBPK tablet   Oral   Take 1 tablet (10 mg total) by mouth daily. 5 tabs PO x 1 day 4 tabs PO x 1 day 3 tabs PO x 1 day 2 tabs PO x 1 day 1 tab PO  x 1 day and stop   15 tablet   0   . Prenatal Vit-Fe Fumarate-FA (PREPLUS) 27-1 MG TABS   Oral   Take 1 tablet by mouth daily.         . QUEtiapine (SEROQUEL) 25 MG tablet   Oral   Take 25 mg by mouth 2 (two) times daily. At 12 pm and at 5 pm         . QUEtiapine (SEROQUEL) 50 MG tablet   Oral   Take 50 mg by mouth at bedtime.         . risperiDONE (RISPERDAL M-TABS) 0.5 MG disintegrating tablet   Oral   Take 1 tablet (0.5 mg total) by mouth 2 (two) times daily.   40 tablet   0   . simethicone (MYLICON) 80 MG chewable tablet   Oral   Chew 80 mg by mouth every 4 (four) hours as needed for flatulence.         . Skin Protectants, Misc. (EUCERIN) cream   Topical   Apply 1 application topically 2 (two) times daily. Pt applies to arms and legs.         . sodium phosphate (FLEET) enema   Rectal   Place 1 enema rectally as needed (for constipation). follow package directions           Allergies Review of patient's allergies indicates no known allergies.  Family History  Problem Relation Age of Onset  . CAD Father   . Cancer Father     Social History Social History  Substance Use Topics  . Smoking status: Former Smoker -- 50 years  . Smokeless tobacco: None  . Alcohol Use: No    Review of Systems Constitutional: No fever/chills Eyes: No visual changes. ENT: No sore throat. No stiff neck no neck pain Cardiovascular: Denies chest pain.Less coughing Respiratory: Positive cough Gastrointestinal:   no vomiting.  No diarrhea.  No constipation. Genitourinary: Negative for dysuria. Musculoskeletal: Negative lower extremity swelling Skin: Negative for rash. Neurological: Negative for headaches, focal weakness or numbness. 10-point ROS otherwise negative.  ____________________________________________   PHYSICAL EXAM:  VITAL SIGNS: ED Triage Vitals  Enc Vitals Group     BP 09/08/15 1653 149/75 mmHg     Pulse Rate 09/08/15 1653 94     Resp 09/08/15  1653 20     Temp 09/08/15 1653 98.4 F (36.9 C)     Temp Source 09/08/15 1653 Oral     SpO2 09/08/15 1653 98 %     Weight 09/08/15 1653 140 lb (63.504 kg)     Height 09/08/15 1653  (1.753 m)     Head Cir --      Peak Flow --      Pain Score 09/08/15 1656 3     Pain Loc --      Pain Edu? --  Excl. in GC? --     Constitutional: Alert and oriented. Well appearing and in no acute distress. Eyes: Conjunctivae are normal. PERRL. EOMI. Head: Atraumatic. Nose: No congestion/rhinnorhea. Mouth/Throat: Mucous membranes are moist.  Oropharynx non-erythematous. Neck: No stridor.   Nontender with no meningismus Cardiovascular: Normal rate, regular rhythm. Grossly normal heart sounds.  Good peripheral circulation. Chest: Tender palpation when I palpate, no crepitus no flail chest. Respiratory: Patient is not retracting, he has occasional slight wheeze which clears with cough and occasional rhonchi which clears with cough, diminished in the bases especially the right base.  Abdominal: Soft and nontender. No distention. No guarding no rebound Back:  There is no focal tenderness or step off there is no midline tenderness there are no lesions noted. there is no CVA tenderness Musculoskeletal: No lower extremity tenderness. No joint effusions, no DVT signs strong distal pulses no edema Neurologic:  Normal speech and language. No gross focal neurologic deficits are appreciated.  Skin:  Skin is warm, dry and intact. No rash noted. Psychiatric: Mood and affect are normal. Speech and behavior are normal.  ____________________________________________   LABS (all labs ordered are listed, but only abnormal results are displayed)  Labs Reviewed  CBC WITH DIFFERENTIAL/PLATELET - Abnormal; Notable for the following:    RBC 4.00 (*)    Hemoglobin 11.1 (*)    HCT 33.1 (*)    RDW 17.6 (*)    All other components within normal limits  TROPONIN I - Abnormal; Notable for the following:    Troponin I  0.03 (*)    All other components within normal limits  BRAIN NATRIURETIC PEPTIDE - Abnormal; Notable for the following:    B Natriuretic Peptide 254.0 (*)    All other components within normal limits  COMPREHENSIVE METABOLIC PANEL   ____________________________________________  EKG  I personally interpreted any EKGs ordered by me or triage Atrial fibrillation rate 70 bpm no acute ST elevation, nonspecific ST changes noted. ____________________________________________  RADIOLOGY  I reviewed any imaging ordered by me or triage that were performed during my shift and, if possible, patient and/or family made aware of any abnormal findings. ____________________________________________   PROCEDURES  Procedure(s) performed: None  Critical Care performed: None  ____________________________________________   INITIAL IMPRESSION / ASSESSMENT AND PLAN / ED COURSE  Pertinent labs & imaging results that were available during my care of the patient were reviewed by me and considered in my medical decision making (see chart for details).  Patient with pleuritic chest wall pain and history of COPD, chest x-ray indicates the need for CT which we're obtaining. Agent is in no acute distress, sats are good on his home oxygen blood work very reassuring. ____________________________________________   FINAL CLINICAL IMPRESSION(S) / ED DIAGNOSES  Final diagnoses:  None      This chart was dictated using voice recognition software.  Despite best efforts to proofread,  errors can occur which can change meaning.     Jeanmarie PlantJames A Phoenix Riesen, MD 09/08/15 [phone]

## 2015-09-09 DIAGNOSIS — F333 Major depressive disorder, recurrent, severe with psychotic symptoms: Secondary | ICD-10-CM

## 2015-09-09 DIAGNOSIS — J9809 Other diseases of bronchus, not elsewhere classified: Secondary | ICD-10-CM

## 2015-09-09 DIAGNOSIS — F03918 Unspecified dementia, unspecified severity, with other behavioral disturbance: Secondary | ICD-10-CM

## 2015-09-09 DIAGNOSIS — J9819 Other pulmonary collapse: Secondary | ICD-10-CM

## 2015-09-09 DIAGNOSIS — J441 Chronic obstructive pulmonary disease with (acute) exacerbation: Secondary | ICD-10-CM

## 2015-09-09 DIAGNOSIS — Z87891 Personal history of nicotine dependence: Secondary | ICD-10-CM

## 2015-09-09 DIAGNOSIS — F0391 Unspecified dementia with behavioral disturbance: Secondary | ICD-10-CM

## 2015-09-09 LAB — BASIC METABOLIC PANEL
ANION GAP: 5 (ref 5–15)
BUN: 18 mg/dL (ref 6–20)
CALCIUM: 8.4 mg/dL — AB (ref 8.9–10.3)
CO2: 28 mmol/L (ref 22–32)
CREATININE: 0.84 mg/dL (ref 0.61–1.24)
Chloride: 107 mmol/L (ref 101–111)
GFR calc non Af Amer: >60 mL/min (ref >60)
GLUCOSE: 88 mg/dL (ref 65–99)
Potassium: 3.4 mmol/L — ABNORMAL LOW (ref 3.5–5.1)
SODIUM: 140 mmol/L (ref 135–145)

## 2015-09-09 LAB — CBC
HCT: 32.8 % — ABNORMAL LOW (ref 40.0–52.0)
HEMOGLOBIN: 11 g/dL — AB (ref 13.0–18.0)
MCH: 27.6 pg (ref 26.0–34.0)
MCHC: 33.4 g/dL (ref 32.0–36.0)
MCV: 82.7 fL (ref 80.0–100.0)
Platelets: 175 10*3/uL (ref 150–440)
RBC: 3.97 MIL/uL — ABNORMAL LOW (ref 4.40–5.90)
RDW: 17.5 % — ABNORMAL HIGH (ref 11.5–14.5)
WBC: 6.4 10*3/uL (ref 3.8–10.6)

## 2015-09-09 LAB — TROPONIN I
Troponin I: 0.03 ng/mL (ref <0.03)
Troponin I: <0.03 ng/mL (ref <0.03)

## 2015-09-09 MED ORDER — ALBUTEROL SULFATE (2.5 MG/3ML) 0.083% IN NEBU
2.5000 mg | INHALATION_SOLUTION | Freq: Four times a day (QID) | RESPIRATORY_TRACT | Status: DC
Start: 2015-09-09 — End: 2015-09-10
  Administered 2015-09-09 – 2015-09-10 (×5): 2.5 mg via RESPIRATORY_TRACT
  Filled 2015-09-09 (×6): qty 3

## 2015-09-09 MED ORDER — PREDNISONE 20 MG PO TABS
40.0000 mg | ORAL_TABLET | Freq: Every day | ORAL | Status: DC
  Administered 2015-09-09 – 2015-09-11 (×3): 40 mg via ORAL
  Filled 2015-09-09 (×3): qty 2

## 2015-09-09 MED ORDER — BUDESONIDE 0.25 MG/2ML IN SUSP
0.2500 mg | Freq: Four times a day (QID) | RESPIRATORY_TRACT | Status: DC
  Administered 2015-09-09 – 2015-09-10 (×4): 0.25 mg via RESPIRATORY_TRACT
  Filled 2015-09-09 (×6): qty 2

## 2015-09-09 MED ORDER — AMOXICILLIN-POT CLAVULANATE 875-125 MG PO TABS
1.0000 | ORAL_TABLET | Freq: Two times a day (BID) | ORAL | Status: DC
  Administered 2015-09-09 – 2015-09-10 (×4): 1 via ORAL
  Filled 2015-09-09 (×4): qty 1

## 2015-09-09 MED ORDER — MIRTAZAPINE 15 MG PO TABS
45.0000 mg | ORAL_TABLET | Freq: Every day | ORAL | Status: DC
  Administered 2015-09-09 – 2015-09-10 (×2): 45 mg via ORAL
  Filled 2015-09-09 (×2): qty 3

## 2015-09-09 MED ORDER — BUDESONIDE 0.25 MG/2ML IN SUSP
0.2500 mg | Freq: Four times a day (QID) | RESPIRATORY_TRACT | Status: DC
  Administered 2015-09-09: 0.25 mg via RESPIRATORY_TRACT

## 2015-09-09 MED ORDER — TRAMADOL HCL 50 MG PO TABS
50.0000 mg | ORAL_TABLET | Freq: Four times a day (QID) | ORAL | Status: DC | PRN
  Administered 2015-09-09 – 2015-09-10 (×2): 50 mg via ORAL
  Filled 2015-09-09 (×2): qty 1

## 2015-09-09 MED ORDER — ALBUTEROL SULFATE (2.5 MG/3ML) 0.083% IN NEBU: 2.5000 mg | INHALATION_SOLUTION | RESPIRATORY_TRACT | Status: DC | PRN

## 2015-09-09 MED ORDER — GUAIFENESIN-DM 100-10 MG/5ML PO SYRP
5.0000 mL | ORAL_SOLUTION | ORAL | Status: DC | PRN
  Administered 2015-09-09: 5 mL via ORAL
  Filled 2015-09-09: qty 5

## 2015-09-09 NOTE — Progress Notes (Signed)
[facility]  Patient Status: Active  [facility] is providing the following services: SN/PT  If patient discharges after hours, please call [phone].   Darrell CaseyJason E Mcconnell 09/09/2015, 10:25 AM

## 2015-09-09 NOTE — Progress Notes (Signed)
Called prime doc regarding pt having a short period of V-tac, Dr Luberta MutterKonidena aware no orders given at this time.

## 2015-09-09 NOTE — Consult Note (Signed)
[facility] Face-to-Face Psychiatry Consult   Reason for Consult:  Consult for this 80 year old man with a history of depression currently in the hospital with respiratory problems. Concern about depression and dementia Referring Physician:  Tressia Miners Patient Identification: Darrell Mcconnell MRN:  [id number] Principal Diagnosis: Depression, major, recurrent, severe with psychosis ([place]) Diagnosis:   Patient Active Problem List   Diagnosis Date Noted  . Depression, major, recurrent, severe with psychosis ([place]) [F33.3] 09/09/2015  . Dementia with behavioral disturbance [F03.91] 09/09/2015  . Sepsis ([place]) [A41.9] 08/26/2015  . Pressure ulcer [L89.90] 08/26/2015  . Chest pain [R07.9] 06/03/2015  . Chronic systolic CHF (congestive heart failure) ([place]) [I50.22] 01/04/2015  . GERD (gastroesophageal reflux disease) [K21.9] 01/04/2015  . HTN (hypertension) [I10] 01/04/2015  . Anxiety [F41.9] 01/04/2015  . Chronic a-fib ([place]) [I48.2] 01/04/2015  . Dementia [F03.90] 01/04/2015  . COPD (chronic obstructive pulmonary disease) ([place]) [J44.9] 01/04/2015  . BPH (benign prostatic hyperplasia) [N40.0] 01/04/2015  . HCAP (healthcare-associated pneumonia) [J18.9] 07/04/2014  . COPD exacerbation ([place]) [J44.1] 07/04/2014  . Aneurysm of aorta ([facility]) [I71.9] 07/04/2014    Total Time spent with patient: 45 minutes  Subjective:   Darrell Mcconnell is a 80 y.o. male patient admitted with "give me some food".  HPI:  Patient seen. Attempted interview. Chart reviewed. Old psychiatric notes reviewed. 80 year old man comes into the hospital from his nursing facility with respiratory complaints and is admitted because of lung problems partially collapsed lung COPD. Concern raised about depression and dementia. Apparently earlier today he made comments to a staff person about wanting to die or about killing himself. When I saw the patient this afternoon he was extremely uncooperative. Most of the time when I spoke with him even in all loud  tone of voice he would not acknowledge that I was there. On a few occasions he did respond to questions and was able to tell me where he lived but nothing really more than that and wouldn't answer any questions about how he felt. Throughout the time I was sitting there he continued to shout that he wanted some food. I explained to him that I had no food but that supper trays would be coming soon. Despite this he continued to shout over and over again to give him some food. Couldn't even test anything further about dementia. Wouldn't answer any questions about suicide. Looks like he still been maintained on antidepressant medication and antipsychotic medication.  Social history: Lives in a nursing home. His wife passed away at maybe 3 years ago which was a huge trauma for him. He does still have some extended family involved.  Medical history: Hypertension atrial fibrillation prosthetic hypertrophy gastric reflux and chronic COPD and congestive heart failure.  Substance abuse history: Not drinking or abusing any drugs no history of substance abuse.  Past Psychiatric History: I've seen this patient before just about 6 months ago and he is extremely different today than he was last time I saw him. In the past we were evaluating him for depression but he seemed to be quite lucid and appropriately interactive. Today he seems to be so demented that he couldn't even answer simple questions. Not clear whether this is true dementia that has progressed or if it's just behavior or a manifestation of depression. He has been on antidepressants and at least 2 different antipsychotics appears to currently be on risperidone to help control behavior problems. He has a more distant past history of suicide attempts and psychiatric hospitalization years ago.  Risk to Self:  Is patient at risk for suicide?: No Risk to Others:   Prior Inpatient Therapy:   Prior Outpatient Therapy:    Past Medical History:  Past Medical  History  Diagnosis Date  . Chronic systolic CHF (congestive heart failure) ([place])   . COPD (chronic obstructive pulmonary disease) ([place])   . Aorta aneurysm ([place])   . Asthma   . Hypertension   . Anxiety   . A-fib ([place])   . Depression   . Hypothyroidism   . GERD (gastroesophageal reflux disease)   . Dementia   . BPH (benign prostatic hyperplasia)     Past Surgical History  Procedure Laterality Date  . Coronary artery bypass graft    . Prostate surgery    . Abdominal aortic aneurysm repair     Family History:  Family History  Problem Relation Age of Onset  . CAD Father   . Cancer Father    Family Psychiatric  History: Patient was not able to give any history about this at this time because of his mental status Social History:  History  Alcohol Use No     History  Drug Use No    Social History   Social History  . Marital Status: Widowed    Spouse Name: N/A  . Number of Children: N/A  . Years of Education: N/A   Social History Main Topics  . Smoking status: Former Smoker -- 29 years  . Smokeless tobacco: None  . Alcohol Use: No  . Drug Use: No  . Sexual Activity: Not Asked   Other Topics Concern  . None   Social History Narrative   Additional Social History:    Allergies:  No Known Allergies  Labs:  Results for orders placed or performed during the hospital encounter of 09/08/15 (from the past 48 hour(s))  Comprehensive metabolic panel     Status: None   Collection Time: 09/08/15  5:42 PM  Result Value Ref Range   Sodium 140 135 - 145 mmol/L   Potassium 3.8 3.5 - 5.1 mmol/L   Chloride 104 101 - 111 mmol/L   CO2 28 22 - 32 mmol/L   Glucose, Bld 91 65 - 99 mg/dL   BUN 20 6 - 20 mg/dL   Creatinine, Ser 0.78 0.61 - 1.24 mg/dL   Calcium 8.9 8.9 - 10.3 mg/dL   Total Protein 6.5 6.5 - 8.1 g/dL   Albumin 3.5 3.5 - 5.0 g/dL   AST 24 15 - 41 U/L   ALT 20 17 - 63 U/L   Alkaline Phosphatase 72 38 - 126 U/L   Total Bilirubin 0.7 0.3 - 1.2 mg/dL   GFR calc  non Af Amer >60 >60 mL/min   GFR calc Af Amer >60 >60 mL/min    Comment: (NOTE) The eGFR has been calculated using the CKD EPI equation. This calculation has not been validated in all clinical situations. eGFR's persistently <60 mL/min signify possible Chronic Kidney Disease.    Anion gap 8 5 - 15  CBC with Differential     Status: Abnormal   Collection Time: 09/08/15  5:42 PM  Result Value Ref Range   WBC 8.7 3.8 - 10.6 K/uL   RBC 4.00 (L) 4.40 - 5.90 MIL/uL   Hemoglobin 11.1 (L) 13.0 - 18.0 g/dL   HCT 33.1 (L) 40.0 - 52.0 %   MCV 82.6 80.0 - 100.0 fL   MCH 27.7 26.0 - 34.0 pg   MCHC 33.5 32.0 - 36.0 g/dL   RDW  17.6 (H) 11.5 - 14.5 %   Platelets 199 150 - 440 K/uL   Neutrophils Relative % 69 %   Neutro Abs 6.0 1.4 - 6.5 K/uL   Lymphocytes Relative 14 %   Lymphs Abs 1.2 1.0 - 3.6 K/uL   Monocytes Relative 11 %   Monocytes Absolute 0.9 0.2 - 1.0 K/uL   Eosinophils Relative 5 %   Eosinophils Absolute 0.5 0 - 0.7 K/uL   Basophils Relative 1 %   Basophils Absolute 0.1 0 - 0.1 K/uL  Troponin I     Status: Abnormal   Collection Time: 09/08/15  5:42 PM  Result Value Ref Range   Troponin I 0.03 (HH) <0.03 ng/mL    Comment: CRITICAL RESULT CALLED TO, READ BACK BY AND VERIFIED WITH SHANNON HATCH ON 09/08/15 AT 1818 [facility]   Brain natriuretic peptide     Status: Abnormal   Collection Time: 09/08/15  5:42 PM  Result Value Ref Range   B Natriuretic Peptide 254.0 (H) 0.0 - 100.0 pg/mL  Troponin I     Status: Abnormal   Collection Time: 09/08/15 11:44 PM  Result Value Ref Range   Troponin I 0.03 (HH) <0.03 ng/mL    Comment: CRITICAL VALUE NOTED. VALUE IS CONSISTENT WITH PREVIOUSLY REPORTED/CALLED VALUE.PMH  Basic metabolic panel     Status: Abnormal   Collection Time: 09/09/15  4:25 AM  Result Value Ref Range   Sodium 140 135 - 145 mmol/L   Potassium 3.4 (L) 3.5 - 5.1 mmol/L   Chloride 107 101 - 111 mmol/L   CO2 28 22 - 32 mmol/L   Glucose, Bld 88 65 - 99 mg/dL   BUN 18 6 - 20  mg/dL   Creatinine, Ser 0.84 0.61 - 1.24 mg/dL   Calcium 8.4 (L) 8.9 - 10.3 mg/dL   GFR calc non Af Amer >60 >60 mL/min   GFR calc Af Amer >60 >60 mL/min    Comment: (NOTE) The eGFR has been calculated using the CKD EPI equation. This calculation has not been validated in all clinical situations. eGFR's persistently <60 mL/min signify possible Chronic Kidney Disease.    Anion gap 5 5 - 15  CBC     Status: Abnormal   Collection Time: 09/09/15  4:25 AM  Result Value Ref Range   WBC 6.4 3.8 - 10.6 K/uL   RBC 3.97 (L) 4.40 - 5.90 MIL/uL   Hemoglobin 11.0 (L) 13.0 - 18.0 g/dL   HCT 32.8 (L) 40.0 - 52.0 %   MCV 82.7 80.0 - 100.0 fL   MCH 27.6 26.0 - 34.0 pg   MCHC 33.4 32.0 - 36.0 g/dL   RDW 17.5 (H) 11.5 - 14.5 %   Platelets 175 150 - 440 K/uL  Troponin I     Status: None   Collection Time: 09/09/15  4:25 AM  Result Value Ref Range   Troponin I <0.03 <0.03 ng/mL  Troponin I     Status: None   Collection Time: 09/09/15  9:37 AM  Result Value Ref Range   Troponin I <0.03 <0.03 ng/mL    Current Facility-Administered Medications  Medication Dose Route Frequency Provider Last Rate Last Dose  . acetaminophen (TYLENOL) tablet 650 mg  650 mg Oral Q6H PRN Lance Coon, MD       Or  . acetaminophen (TYLENOL) suppository 650 mg  650 mg Rectal Q6H PRN Lance Coon, MD      . albuterol (PROVENTIL) (2.5 MG/3ML) 0.083% nebulizer solution 2.5 mg  2.5  mg Nebulization Q3H PRN Wilhelmina Mcardle, MD      . albuterol (PROVENTIL) (2.5 MG/3ML) 0.083% nebulizer solution 2.5 mg  2.5 mg Nebulization Q6H Wilhelmina Mcardle, MD   2.5 mg at 09/09/15 1719  . ALPRAZolam Duanne Moron) tablet 0.25 mg  0.25 mg Oral Q8H PRN Lance Coon, MD      . amoxicillin-clavulanate (AUGMENTIN) 875-125 MG per tablet 1 tablet  1 tablet Oral Q12H Gladstone Lighter, MD      . aspirin EC tablet 81 mg  81 mg Oral QPM Lance Coon, MD   81 mg at 09/08/15 2258  . brimonidine (ALPHAGAN) 0.2 % ophthalmic solution 1 drop  1 drop Both Eyes BID  Lance Coon, MD   1 drop at 09/09/15 0918   And  . timolol (TIMOPTIC) 0.5 % ophthalmic solution 1 drop  1 drop Both Eyes BID Lance Coon, MD   1 drop at 09/09/15 0917  . budesonide (PULMICORT) nebulizer solution 0.25 mg  0.25 mg Nebulization Q6H Wilhelmina Mcardle, MD      . dicyclomine (BENTYL) tablet 20 mg  20 mg Oral Q8H PRN Lance Coon, MD      . digoxin Fonnie Birkenhead) tablet 0.125 mg  0.125 mg Oral Daily Lance Coon, MD   0.125 mg at 09/09/15 0907  . dorzolamide (TRUSOPT) 2 % ophthalmic solution 1 drop  1 drop Both Eyes BID Lance Coon, MD   1 drop at 09/09/15 0917  . enoxaparin (LOVENOX) injection 40 mg  40 mg Subcutaneous Q24H Lance Coon, MD   40 mg at 09/08/15 2145  . escitalopram (LEXAPRO) tablet 15 mg  15 mg Oral Daily Lance Coon, MD   15 mg at 09/09/15 0909  . finasteride (PROSCAR) tablet 5 mg  5 mg Oral Daily Lance Coon, MD   5 mg at 09/09/15 0908  . furosemide (LASIX) tablet 40 mg  40 mg Oral Brigid Re, MD   40 mg at 09/09/15 0610  . guaiFENesin-dextromethorphan (ROBITUSSIN DM) 100-10 MG/5ML syrup 5 mL  5 mL Oral Q4H PRN Gladstone Lighter, MD   5 mL at 09/09/15 1228  . latanoprost (XALATAN) 0.005 % ophthalmic solution 1 drop  1 drop Both Eyes QHS Lance Coon, MD   1 drop at 09/08/15 2200  . levothyroxine (SYNTHROID, LEVOTHROID) tablet 25 mcg  25 mcg Oral Brigid Re, MD   25 mcg at 09/09/15 0610  . lisinopril (PRINIVIL,ZESTRIL) tablet 2.5 mg  2.5 mg Oral Daily Lance Coon, MD   2.5 mg at 09/09/15 0908  . metoprolol tartrate (LOPRESSOR) tablet 12.5 mg  12.5 mg Oral BID Lance Coon, MD   12.5 mg at 09/09/15 0908  . mirtazapine (REMERON) tablet 45 mg  45 mg Oral QHS Gonzella Lex, MD      . morphine 2 MG/ML injection 2 mg  2 mg Intravenous Q4H PRN Lance Coon, MD      . morphine CONCENTRATE 10 MG/0.5ML oral solution 5 mg  5 mg Oral Q2H PRN Lance Coon, MD      . ondansetron [facility]) tablet 4 mg  4 mg Oral Q6H PRN Lance Coon, MD       Or  . ondansetron  [facility]) injection 4 mg  4 mg Intravenous Q6H PRN Lance Coon, MD      . oxyCODONE (Oxy IR/ROXICODONE) immediate release tablet 5 mg  5 mg Oral Q4H PRN Lance Coon, MD      . pantoprazole (PROTONIX) EC tablet 40 mg  40 mg Oral Daily  Lance Coon, MD   40 mg at 09/09/15 0908  . predniSONE (DELTASONE) tablet 40 mg  40 mg Oral Q breakfast Lance Coon, MD   40 mg at 09/09/15 0908  . QUEtiapine (SEROQUEL) tablet 25 mg  25 mg Oral BID Lance Coon, MD      . QUEtiapine (SEROQUEL) tablet 50 mg  50 mg Oral QHS Lance Coon, MD   50 mg at 09/08/15 2259  . risperiDONE (RISPERDAL M-TABS) disintegrating tablet 0.5 mg  0.5 mg Oral BID Lance Coon, MD   0.5 mg at 09/09/15 0908  . sodium chloride flush (NS) 0.9 % injection 3 mL  3 mL Intravenous Q12H Lance Coon, MD   3 mL at 09/09/15 0923  . traMADol (ULTRAM) tablet 50 mg  50 mg Oral Q6H PRN Gladstone Lighter, MD   50 mg at 09/09/15 1114    Musculoskeletal: Strength & Muscle Tone: atrophy Gait & Station: unable to stand Patient leans: N/A  Psychiatric Specialty Exam: Physical Exam  Nursing note and vitals reviewed. Constitutional: He appears well-nourished. He appears cachectic. He is uncooperative.  HENT:  Head: Normocephalic and atraumatic.  Eyes: Conjunctivae are normal. Pupils are equal, round, and reactive to light.  Neck: Normal range of motion.  Cardiovascular: Regular rhythm and normal heart sounds.   Respiratory: Effort normal.  GI: Soft.  Musculoskeletal: Normal range of motion.  Neurological: He is unresponsive.  Skin: Skin is warm and dry.  Psychiatric: His affect is blunt and inappropriate. His speech is delayed. He is agitated and slowed. He expresses impulsivity. He is noncommunicative. He exhibits abnormal recent memory and abnormal remote memory.  Patient's thought content extremely limited. Seems to be either very demented or uncooperative or impaired by his depression    Review of Systems  Unable to perform ROS:  psychiatric disorder    Blood pressure 121/78, pulse 62, temperature 98.2 F (36.8 C), temperature source Oral, resp. rate 17, height '5\' 9"'  (1.753 m), weight 60.238 kg (132 lb 12.8 oz), SpO2 97 %.Body mass index is 19.6 kg/(m^2).  General Appearance: Disheveled  Eye Contact:  Minimal  Speech:  Garbled  Volume:  Decreased  Mood:  Irritable  Affect:  Restricted  Thought Process:  Irrelevant  Orientation:  Negative  Thought Content:  Negative and Illogical  Suicidal Thoughts:  Unknown. Comments made this morning.  Homicidal Thoughts:  No  Memory:  Immediate;   Negative Recent;   Negative Remote;   Negative  Judgement:  Impaired  Insight:  Lacking  Psychomotor Activity:  Mannerisms  Concentration:  Concentration: Poor  Recall:  Poor  Fund of Knowledge:  Poor  Language:  Poor  Akathisia:  No  Handed:  Right  AIMS (if indicated):     Assets:  Housing  ADL's:  Impaired  Cognition:  Impaired,  Moderate and Severe  Sleep:        Treatment Plan Summary: Daily contact with patient to assess and evaluate symptoms and progress in treatment, Medication management and Plan Puzzling situation. Based on my interaction with him it's impossible to tell how much of this is depression and how much of this is dementia. I will have to further evaluate what he looks like over the next day or so and how he is interacting with other people. I will see him again of course tomorrow. No change to medication for now. I don't think he needs a sitter necessarily or needs involuntary commitment. So far he appears to be cooperative with treatment and not acting  in a way to harm himself.  Disposition: No evidence of imminent risk to self or others at present.    Alethia Berthold, MD 09/09/2015 5:39 PM

## 2015-09-09 NOTE — Consult Note (Signed)
PULMONARY CONSULT NOTE  Requesting MD/Service: Nemiah Commander Date of initial consultation: 09/09/15 Reason for consultation: RML collapse  HPI: 28 M admitted 09/08/15 to Hospitalist Service with several days to weeks of increasing dyspnea and rattling cough. He is totally blind and therefore unable to characterize his sputum. Denies CP, fever, hemoptysis, LE edema and calf tenderness. His admission CXR revealed an opacity in the R base and CT chest was performed which is described below    Past Medical History  Diagnosis Date  . Chronic systolic CHF (congestive heart failure) (H[facility])   . COPD (chronic obstructive pulmonary disease) ([facility])   . Aorta aneurysm ([facility])   . Asthma   . Hypertension   . Anxiety   . A-fib ([facility])   . Depression   . Hypothyroidism   . GERD (gastroesophageal reflux disease)   . Dementia   . BPH (benign prostatic hyperplasia)     Past Surgical History  Procedure Laterality Date  . Coronary artery bypass graft    . Prostate surgery    . Abdominal aortic aneurysm repair      MEDICATIONS: I have reviewed all medications and confirmed regimen as documented  Social History   Social History  . Marital Status: Widowed    Spouse Name: N/A  . Number of Children: N/A  . Years of Education: N/A   Occupational History  . Not on file.   Social History Main Topics  . Smoking status: Former Smoker -- 50 years  . Smokeless tobacco: Not on file  . Alcohol Use: No  . Drug Use: No  . Sexual Activity: Not on file   Other Topics Concern  . Not on file   Social History Narrative    Family History  Problem Relation Age of Onset  . CAD Father   . Cancer Father     ROS: No fever, myalgias/arthralgias, unexplained weight loss or weight gain No new focal weakness or sensory deficits No otalgia, hearing loss, visual changes, nasal and sinus symptoms, mouth and throat problems No neck pain or adenopathy No abdominal pain, N/V/D, diarrhea, change in bowel  pattern No dysuria, change in urinary pattern   Filed Vitals:   09/09/15 0540 09/09/15 0542 09/09/15 0745 09/09/15 0906  BP: 98/55 104/57 121/78   Pulse: 59 56 58 62  Temp:   98.2 F (36.8 C)   TempSrc:      Resp:      Height:      Weight:      SpO2:   97%      EXAM:  Gen: Blind, somewhat agitated HEENT: NCAT, sclera white, oropharynx normal Neck: Supple without LAN, thyromegaly, JVD Lungs: breath sounds: markedly diminished in R base, percussion: normal, diffuse rhonchi, no wheezes Cardiovascular: RRR, no murmurs noted Abdomen: Soft, nontender, normal BS Ext: without clubbing, cyanosis, edema Neuro: CNs grossly intact, motor and sensory intact Skin: Limited exam, no lesions noted  DATA:   BMP Latest Ref Rng 09/09/2015 09/08/2015 08/29/2015  Glucose 65 - 99 mg/dL 88 91 528(U)  BUN 6 - 20 mg/dL 18 20 13(K)  Creatinine 0.61 - 1.24 mg/dL 4.40 1.02 7.25  Sodium 135 - 145 mmol/L 140 140 141  Potassium 3.5 - 5.1 mmol/L 3.4(L) 3.8 4.3  Chloride 101 - 111 mmol/L 107 104 105  CO2 22 - 32 mmol/L 28 28 33(H)  Calcium 8.9 - 10.3 mg/dL 3.6(U) 8.9 4.4(I)    CBC Latest Ref Rng 09/09/2015 09/08/2015 08/29/2015  WBC 3.8 - 10.6 K/uL 6.4 8.7 8.1  Hemoglobin 13.0 - 18.0 g/dL 11.0(L) 11.1(L) 11.1(L)  Hematocrit 40.0 - 52.0 % 32.8(L) 33.1(L) 33.4(L)  Platelets 150 - 440 K/uL 175 199 119(L)    CT chest 09/08/15:  Small bilateral pleural effusions, right greater than left. RML collapse without an obvious obstructing mass.  IMPRESSION:   Former smoker - remote RML collapse - likely mucus impaction. R/O obstructing mass COPD   PLAN:  Nebulized steroids and albuterol Recheck CXR in AM 07/18 If RML remains collapsed, I will consider bronchoscopy for airway exam   Billy Fischeravid Kieron Kantner, MD PCCM service Mobile [phone] Pager [phone] 09/09/2015

## 2015-09-09 NOTE — Progress Notes (Signed)
Sitter was discontinued for about 3 hours today during that time patient tried to get out of bed multiple times as well as calling out for various things. Once nursing gave him what he wanted he continued to ask for them. At one time our assistant director sat in the room with him for an hour. New sitter arrived at 5p patients has been fairly quiet and more cooperative.

## 2015-09-09 NOTE — Progress Notes (Signed)
[facility] [place] [facility]   PATIENT NAME: Darrell StandardClyde Mcconnell    MR#:  [id number]  DATE OF BIRTH:  02/03/1936  SUBJECTIVE:  CHIEF COMPLAINT:   Chief Complaint  Patient presents with  . Cough  . Pleurisy   - admitted with worsening cough and dyspnea - CT on adm with possible mucus plug and RLL collapse - sats stable on 2-3L o2 which is chronic - patient gets anxious, emotional, talking about dying etc -sitter at beide  REVIEW OF SYSTEMS:  Review of Systems  Constitutional: Positive for malaise/fatigue. Negative for fever and chills.  HENT: Positive for hearing loss. Negative for ear discharge, ear pain and nosebleeds.   Eyes:       Legally blind  Respiratory: Positive for cough and shortness of breath. Negative for wheezing.   Cardiovascular: Negative for chest pain, palpitations and leg swelling.  Gastrointestinal: Negative for nausea, vomiting, abdominal pain, diarrhea and constipation.  Genitourinary: Negative for dysuria.  Neurological: Negative for dizziness, speech change, focal weakness, seizures and headaches.  Psychiatric/Behavioral: Positive for depression. The patient is nervous/anxious.     DRUG ALLERGIES:  No Known Allergies  VITALS:  Blood pressure 121/78, pulse 62, temperature 98.2 F (36.8 C), temperature source Oral, resp. rate 17, height 5\' 9"  (1.753 m), weight 60.238 kg (132 lb 12.8 oz), SpO2 97 %.  PHYSICAL EXAMINATION:  Physical Exam  GENERAL:  80 y.o.-year-old patient lying in the bed, anxious and tearful.  EYES: Pupils equal, round, reactive to light and accommodation. No scleral icterus. Extraocular muscles intact. Legally blind. HEENT: Head atraumatic, normocephalic. Oropharynx and nasopharynx clear.  NECK:  Supple, no jugular venous distention. No thyroid enlargement, no tenderness.  LUNGS: scant breath sounds bilaterally, decreased right base. , no wheezing, rales,rhonchi or crepitation. No use of accessory muscles of  respiration.  CARDIOVASCULAR: S1, S2 normal. No rubs, or gallops. 3/6 systolic murmur present. ABDOMEN: Soft, nontender, nondistended. Bowel sounds present. No organomegaly or mass.  EXTREMITIES: No pedal edema, cyanosis, or clubbing.  NEUROLOGIC: Cranial nerves II through XII are intact. Global weakness noted, lower extremity strength 3/5, upper extremities 5/5. Sensation intact. Gait not checked.  PSYCHIATRIC: The patient is alert and oriented x 1-2. Anxious and emotional, talking about dying. SKIN: No obvious rash, lesion, or ulcer.    LABORATORY PANEL:   CBC  Recent Labs Lab 09/09/15 0425  WBC 6.4  HGB 11.0*  HCT 32.8*  PLT 175   ------------------------------------------------------------------------------------------------------------------  Chemistries   Recent Labs Lab 09/08/15 1742 09/09/15 0425  NA 140 140  K 3.8 3.4*  CL 104 107  CO2 28 28  GLUCOSE 91 88  BUN 20 18  CREATININE 0.78 0.84  CALCIUM 8.9 8.4*  AST 24  --   ALT 20  --   ALKPHOS 72  --   BILITOT 0.7  --    ------------------------------------------------------------------------------------------------------------------  Cardiac Enzymes  Recent Labs Lab 09/09/15 0937  TROPONINI <0.03   ------------------------------------------------------------------------------------------------------------------  RADIOLOGY:  Dg Chest 2 View  09/08/2015  CLINICAL DATA:  Patient with cough for unknown duration. EXAM: CHEST  2 VIEW COMPARISON:  Chest CT 01/08/2015; chest radiograph 08/28/2015. FINDINGS: Stable cardiac and mediastinal contours status post median sternotomy and CABG procedure. There is a persistent small right pleural effusion. Additionally there is suggestion of a more focal masslike opacity with the right lower hemi thorax. Multilevel degenerative disease of the thoracic spine. IMPRESSION: Persistent moderate right pleural effusion. Additionally there is suggestion of a more focal masslike  opacity within the right lower hemi thorax which is nonspecific and may resent loculated fluid, atelectasis or underlying pulmonary mass. Recommend dedicated evaluation with chest CT. Electronically Signed   By: Annia Belt M.D.   On: 09/08/2015 17:37   Ct Chest W Contrast  09/08/2015  CLINICAL DATA:  Cough for few days. EXAM: CT CHEST WITH CONTRAST TECHNIQUE: Multidetector CT imaging of the chest was performed during intravenous contrast administration. CONTRAST:  75mL ISOVUE-300 IOPAMIDOL (ISOVUE-300) INJECTION 61% COMPARISON:  Chest x-ray 08/28/2015, 09/08/2015; CT chest 02/12/2011, 09/16/2012 FINDINGS: Mediastinum/Lymph Nodes: No masses, pathologically enlarged lymph nodes, or other significant abnormality. Normal heart size. Prior CABG. Thoracic aortic atherosclerosis. Lungs/Pleura: Small bilateral pleural effusions, right greater than left. Partial right lower lobe collapse without a centrally obstructing mass. Small spiculated area of airspace disease in the right upper lobe likely reflecting an area of scarring unchanged compared with 02/12/2011. There are bilateral emphysematous changes. Upper abdomen: Suprarenal abdominal aortic aneurysm measuring 5.1 x 4.4 cm. Musculoskeletal: No chest wall mass or suspicious bone lesions identified. IMPRESSION: 1. Small bilateral pleural effusions, right greater than left. 2. Partial right lower lobe collapse without a centrally obstructing mass. This may be secondary to mucoid impaction. 3. Suprarenal abdominal aortic aneurysm measuring 5.1 x 4.4 cm. Electronically Signed   By: Elige Ko   On: 09/08/2015 19:41    EKG:   Orders placed or performed during the hospital encounter of 09/08/15  . ED EKG  . ED EKG    ASSESSMENT AND PLAN:   Kiefer Opheim is a 80 y.o. male with a known history of Congestive heart failure systolic, COPD on chronic 2L home oxygen, hypertension, anxiety comes to the emergency room from his retirement home with increasing shortness of  breath. Noted to have right lower lobe lung collapse from mucous plug.  #1 COPD exacerbation-continue prednisone taper. -Inhalers and nebulizer treatments. -Continue oxygen support.  #2 right lower lobe lung collapse from mucous plug-change antibiotics to Augmentin to cover for postobstructive pneumonia. -Cough medicines. -Tramadol for chest pain musculoskeletal from coughing. -Appreciate pulmonary consult. Follow-up chest x-ray tomorrow. If right lower lobe is still collapsed, considering bronchoscopy according to pulmonologist.  #3 chronic diastolic CHF-stable. Continue lisinopril, metoprolol, Lasix.  Well compensated.  #4 depression and anxiety-on Remeron and Lexapro. Also started on risperidone last admission. Continues to have episodes of worsening anxiety. -Psych consult today as patient continues to talk about dying. -Also on Xanax as needed  #5 paroxysmal atrial fibrillation-on digoxin. Also on metoprolol. -Not on anticoagulation due to risk of bleeding.  #6 DVT prophylaxis-on Lovenox    All the records are reviewed and case discussed with Care Management/Social Workerr. Management plans discussed with the patient, family and they are in agreement.  CODE STATUS: DNR  TOTAL TIME TAKING CARE OF THIS PATIENT: 33 minutes.   POSSIBLE D/C IN 2DAYS, DEPENDING ON CLINICAL CONDITION.   Enid Baas M.D on 09/09/2015 at 3:36 PM  Between 7am to 6pm - Pager - [phone]  After 6pm go to [email or web address] - password [email or web address] [facility]  [facility]  Office  [phone]  CC: Primary care physician; Lyndon Code, MD

## 2015-09-09 NOTE — Care Management Important Message (Signed)
Important Message  Patient Details  Name: Darrell Mcconnell MRN: [id number] Date of Birth: [birth year]   Medicare Important Message Given:  Yes    [name], Bevin Das L, RN 09/09/2015, 1:27 PM

## 2015-09-09 NOTE — Progress Notes (Signed)
Pt combative during am assessment. Unable to fully assess base of lungs. Pt has also states he wants to die and wants to be left alone. During our conversation he c/o CP Dr Nemiah CommanderKalisetti made aware orders received.

## 2015-09-10 ENCOUNTER — Inpatient Hospital Stay: Payer: Medicare Other

## 2015-09-10 MED ORDER — ESCITALOPRAM OXALATE 10 MG PO TABS
20.0000 mg | ORAL_TABLET | Freq: Every day | ORAL | Status: DC
  Administered 2015-09-11: 20 mg via ORAL
  Filled 2015-09-10: qty 2

## 2015-09-10 MED ORDER — BUDESONIDE 0.25 MG/2ML IN SUSP
0.2500 mg | Freq: Two times a day (BID) | RESPIRATORY_TRACT | Status: DC
  Administered 2015-09-11: 0.25 mg via RESPIRATORY_TRACT
  Filled 2015-09-10: qty 2

## 2015-09-10 MED ORDER — SENNA 8.6 MG PO TABS
1.0000 | ORAL_TABLET | Freq: Every day | ORAL | Status: DC
  Administered 2015-09-11: 8.6 mg via ORAL
  Filled 2015-09-10: qty 1

## 2015-09-10 MED ORDER — CLONAZEPAM 0.5 MG PO TABS
0.5000 mg | ORAL_TABLET | Freq: Two times a day (BID) | ORAL | Status: DC
  Administered 2015-09-10 – 2015-09-11 (×3): 0.5 mg via ORAL
  Filled 2015-09-10 (×3): qty 1

## 2015-09-10 MED ORDER — DOCUSATE SODIUM 100 MG PO CAPS
100.0000 mg | ORAL_CAPSULE | Freq: Two times a day (BID) | ORAL | Status: DC
  Administered 2015-09-10 – 2015-09-11 (×3): 100 mg via ORAL
  Filled 2015-09-10 (×3): qty 1

## 2015-09-10 MED ORDER — ALBUTEROL SULFATE (2.5 MG/3ML) 0.083% IN NEBU
2.5000 mg | INHALATION_SOLUTION | Freq: Three times a day (TID) | RESPIRATORY_TRACT | Status: DC
  Administered 2015-09-11: 2.5 mg via RESPIRATORY_TRACT
  Filled 2015-09-10: qty 3

## 2015-09-10 NOTE — [place] FL2 (Signed)
[place] MEDICAID FL2 LEVEL OF CARE SCREENING TOOL     IDENTIFICATION  Patient Name: Darrell Mcconnell Birthdate: [birth year] Sex: male Admission Date (Current Location): 09/08/2015  [place] and [place] Number:  [name]  ([id number]) Facility and Address:  [facility], [address], [place], [place] [address]      Provider Number: [id number]  Attending Physician Name and Address:  Enid Baas, MD  Relative Name and Phone Number:       Current Level of Care: Hospital Recommended Level of Care: Skilled Nursing Facility Prior Approval Number:    Date Approved/Denied:   PASRR Number:    Discharge Plan: SNF    Current Diagnoses: Patient Active Problem List   Diagnosis Date Noted  . Depression, major, recurrent, severe with psychosis (HCC) 09/09/2015  . Dementia with behavioral disturbance 09/09/2015  . Sepsis (HCC) 08/26/2015  . Pressure ulcer 08/26/2015  . Chest pain 06/03/2015  . Chronic systolic CHF (congestive heart failure) (HCC) 01/04/2015  . GERD (gastroesophageal reflux disease) 01/04/2015  . HTN (hypertension) 01/04/2015  . Anxiety 01/04/2015  . Chronic a-fib (HCC) 01/04/2015  . Dementia 01/04/2015  . COPD (chronic obstructive pulmonary disease) (HCC) 01/04/2015  . BPH (benign prostatic hyperplasia) 01/04/2015  . HCAP (healthcare-associated pneumonia) 07/04/2014  . COPD exacerbation (HCC) 07/04/2014  . Aneurysm of aorta (HCC) 07/04/2014    Orientation RESPIRATION BLADDER Height & Weight     Self  O2 (2 Liters Oxygen ) Continent Weight: 135 lb 14.4 oz (61.644 kg) Height:  5\' 9"  (175.3 cm)  BEHAVIORAL SYMPTOMS/MOOD NEUROLOGICAL BOWEL NUTRITION STATUS   (none )  (none ) Continent Diet (Diet: Heart Healthy )  AMBULATORY STATUS COMMUNICATION OF NEEDS Skin   Extensive Assist Verbally PU Stage and Appropriate Care (Pressure Ulcer Stage 1: Coccyx )                       Personal Care Assistance Level of Assistance  Bathing,  Feeding, Dressing Bathing Assistance: Limited assistance Feeding assistance: Independent Dressing Assistance: Limited assistance     Functional Limitations Info  Sight, Hearing, Speech Sight Info: Impaired Hearing Info: Impaired Speech Info: Adequate    SPECIAL CARE FACTORS FREQUENCY  PT (By licensed PT), OT (By licensed OT)     PT Frequency:  (5) OT Frequency:  (5)            Contractures      Additional Factors Info  Code Status, Allergies, Psychotropic Code Status Info:  (DNR ) Allergies Info:  (No Known Allergies. )           Current Medications (09/10/2015):  This is the current hospital active medication list Current Facility-Administered Medications  Medication Dose Route Frequency Provider Last Rate Last Dose  . acetaminophen (TYLENOL) tablet 650 mg  650 mg Oral Q6H PRN Oralia Manis, MD       Or  . acetaminophen (TYLENOL) suppository 650 mg  650 mg Rectal Q6H PRN Oralia Manis, MD      . albuterol (PROVENTIL) (2.5 MG/3ML) 0.083% nebulizer solution 2.5 mg  2.5 mg Nebulization Q3H PRN Merwyn Katos, MD      . albuterol (PROVENTIL) (2.5 MG/3ML) 0.083% nebulizer solution 2.5 mg  2.5 mg Nebulization Q6H Merwyn Katos, MD   2.5 mg at 09/10/15 0721  . ALPRAZolam Prudy Feeler) tablet 0.25 mg  0.25 mg Oral Q8H PRN Oralia Manis, MD   0.25 mg at 09/10/15 1238  . amoxicillin-clavulanate (AUGMENTIN) 875-125 MG per tablet 1 tablet  1 tablet Oral Q12H Enid Baasadhika Kalisetti, MD   1 tablet at 09/10/15 0940  . aspirin EC tablet 81 mg  81 mg Oral QPM Oralia Manisavid Willis, MD   81 mg at 09/09/15 1754  . brimonidine (ALPHAGAN) 0.2 % ophthalmic solution 1 drop  1 drop Both Eyes BID Oralia Manisavid Willis, MD   1 drop at 09/09/15 0918   And  . timolol (TIMOPTIC) 0.5 % ophthalmic solution 1 drop  1 drop Both Eyes BID Oralia Manisavid Willis, MD   1 drop at 09/09/15 0917  . budesonide (PULMICORT) nebulizer solution 0.25 mg  0.25 mg Nebulization Q6H Merwyn Katosavid B Simonds, MD   0.25 mg at 09/10/15 0721  . clonazePAM (KLONOPIN)  tablet 0.5 mg  0.5 mg Oral BID Enid Baasadhika Kalisetti, MD      . dicyclomine (BENTYL) tablet 20 mg  20 mg Oral Q8H PRN Oralia Manisavid Willis, MD      . digoxin Margit Banda(LANOXIN) tablet 0.125 mg  0.125 mg Oral Daily Oralia Manisavid Willis, MD   0.125 mg at 09/10/15 0940  . docusate sodium (COLACE) capsule 100 mg  100 mg Oral BID Enid Baasadhika Kalisetti, MD      . dorzolamide (TRUSOPT) 2 % ophthalmic solution 1 drop  1 drop Both Eyes BID Oralia Manisavid Willis, MD   1 drop at 09/09/15 0917  . enoxaparin (LOVENOX) injection 40 mg  40 mg Subcutaneous Q24H Oralia Manisavid Willis, MD   40 mg at 09/09/15 2148  . escitalopram (LEXAPRO) tablet 15 mg  15 mg Oral Daily Oralia Manisavid Willis, MD   15 mg at 09/10/15 0939  . finasteride (PROSCAR) tablet 5 mg  5 mg Oral Daily Oralia Manisavid Willis, MD   5 mg at 09/10/15 0940  . furosemide (LASIX) tablet 40 mg  40 mg Oral Annett FabianBH-q7a David Willis, MD   40 mg at 09/10/15 [id number]  . guaiFENesin-dextromethorphan (ROBITUSSIN DM) 100-10 MG/5ML syrup 5 mL  5 mL Oral Q4H PRN Enid Baasadhika Kalisetti, MD   5 mL at 09/09/15 1228  . latanoprost (XALATAN) 0.005 % ophthalmic solution 1 drop  1 drop Both Eyes QHS Oralia Manisavid Willis, MD   1 drop at 09/08/15 2200  . levothyroxine (SYNTHROID, LEVOTHROID) tablet 25 mcg  25 mcg Oral Annett FabianBH-q7a David Willis, MD   25 mcg at 09/10/15 [id number]  . lisinopril (PRINIVIL,ZESTRIL) tablet 2.5 mg  2.5 mg Oral Daily Oralia Manisavid Willis, MD   2.5 mg at 09/10/15 0940  . metoprolol tartrate (LOPRESSOR) tablet 12.5 mg  12.5 mg Oral BID Oralia Manisavid Willis, MD   12.5 mg at 09/10/15 0940  . mirtazapine (REMERON) tablet 45 mg  45 mg Oral QHS Audery AmelJohn T Clapacs, MD   45 mg at 09/09/15 2149  . morphine 2 MG/ML injection 2 mg  2 mg Intravenous Q4H PRN Oralia Manisavid Willis, MD      . morphine CONCENTRATE 10 MG/0.5ML oral solution 5 mg  5 mg Oral Q2H PRN Oralia Manisavid Willis, MD      . ondansetron [facility](ZOFRAN) tablet 4 mg  4 mg Oral Q6H PRN Oralia Manisavid Willis, MD       Or  . ondansetron [facility](ZOFRAN) injection 4 mg  4 mg Intravenous Q6H PRN Oralia Manisavid Willis, MD      . pantoprazole (PROTONIX) EC tablet 40 mg   40 mg Oral Daily Oralia Manisavid Willis, MD   40 mg at 09/10/15 0946  . predniSONE (DELTASONE) tablet 40 mg  40 mg Oral Q breakfast Oralia Manisavid Willis, MD   40 mg at 09/10/15 0939  . QUEtiapine (SEROQUEL) tablet 25 mg  25 mg Oral BID  Oralia Manis, MD   25 mg at 09/10/15 1238  . QUEtiapine (SEROQUEL) tablet 50 mg  50 mg Oral QHS Oralia Manis, MD   50 mg at 09/09/15 2148  . risperiDONE (RISPERDAL M-TABS) disintegrating tablet 0.5 mg  0.5 mg Oral BID Oralia Manis, MD   0.5 mg at 09/10/15 0939  . senna (SENOKOT) tablet 8.6 mg  1 tablet Oral Daily Enid Baas, MD      . sodium chloride flush (NS) 0.9 % injection 3 mL  3 mL Intravenous Q12H Oralia Manis, MD   3 mL at 09/10/15 0947  . traMADol (ULTRAM) tablet 50 mg  50 mg Oral Q6H PRN Enid Baas, MD   50 mg at 09/10/15 0940     Discharge Medications: Please see discharge summary for a list of discharge medications.  Relevant Imaging Results:  Relevant Lab Results:   Additional Information  (SSN: [id number])  Maxemiliano Riel, Ladon Applebaum, LCSW

## 2015-09-10 NOTE — Progress Notes (Signed)
MD notified and I requested that Sitter order be changed to PRN. Order given for PRN sitter

## 2015-09-10 NOTE — Progress Notes (Signed)
Pt refusing neb treatment, pt stated that he does not want to live and wants to be left alone. Pt said that he does not want anymore breathing treatments since he had no reason to live. Made RN aware

## 2015-09-10 NOTE — Clinical Social Work Note (Signed)
Clinical Social Work Assessment  Patient Details  Name: Darrell Mcconnell MRN: [id number] Date of Birth: 02-11-1936  Date of referral:  09/10/15               Reason for consult:  Facility Placement, Other (Comment Required) (From [facility] )                Permission sought to share information with:  Oceanographeracility Contact Representative Permission granted to share information::  Yes, Verbal Permission Granted  Name::      Skilled Nursing Facility   Agency::   [place]   Relationship::     Contact Information:     Housing/Transportation Living arrangements for the past 2 months:  Assisted Living Facility Source of Information:  Adult Children, Facility Patient Interpreter Needed:  None Criminal Activity/Legal Involvement Pertinent to Current Situation/Hospitalization:  No - Comment as needed Significant Relationships:  Adult Children Lives with:  Facility Resident Do you feel safe going back to the place where you live?  Yes Need for family participation in patient care:  Yes (Comment)  Care giving concerns:  [facility].    Social Worker assessment / plan:  Visual merchandiserClinical Social Worker (CSW) is familiar with patient from last admission. [facility]. [facility] owner patient has been total care at the facility and she cannot take him back at this time. Per Darrell Mcconnell patient needs a higher level of care. CSW contacted patient's son Darrell Mcconnell to discuss D/C plan. Son reported that he is not surprised that Darrell Mcconnell will not take patient back because he has been a "hand-full." Per son patient has a history of getting confused and becoming agitated. CSW explained that SNF search can be started under patient's Medicare. CSW explained that patient's Medicare will pay for SNF if he has a 3 night qualifying inpatient stay at a hospital. Patient was admitted to inpatient on 09/08/15. Son [place] agree[place].    FL2 complete and faxed out. CSW will continue to follow and assist as needed.   Employment status:  Disabled (Comment on whether or not currently receiving Disability), Retired Health and safety inspectornsurance information:  Medicare PT Recommendations:  Not assessed at this time Information / Referral to community resources:  Skilled Nursing Facility  Patient/Family's Response to care:  Patient's son [place] agreeable [place] [facility] [place].   Patient/Family's Understanding of and Emotional Response to Diagnosis, Current Treatment, and Prognosis:  Patient's son was pleasant and thanked CSW for calling.   Emotional Assessment Appearance:  Appears stated age Attitude/Demeanor/Rapport:  Unable to Assess Affect (typically observed):  Unable to Assess Orientation:  Oriented to Self, Fluctuating Orientation (Suspected and/or reported Sundowners) Alcohol / Substance use:  Not Applicable Psych involvement (Current and /or in the community):  No (Comment)  Discharge Needs  Concerns to be addressed:  Discharge Planning Concerns Readmission within the last 30 days:  YES Current discharge risk:  Cognitively Impaired, Dependent with Mobility, Chronically ill Barriers to Discharge:  Continued Medical Work up   [employer], Ladon ApplebaumBailey G, LCSW 09/10/2015, 6:02 PM
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
## 2015-09-10 NOTE — Consult Note (Signed)
[facility] Consult   Reason for Consult:  Follow-up consult for this 80 year old man with a history of depression and severe medical problems. Referring Physician:  Tressia Miners Patient Identification: Jah Alarid MRN:  [id number] Principal Diagnosis: Depression, major, recurrent, severe with psychosis ([place]) Diagnosis:   Patient Active Problem List   Diagnosis Date Noted  . Depression, major, recurrent, severe with psychosis ([place]) [F33.3] 09/09/2015  . Dementia with behavioral disturbance [F03.91] 09/09/2015  . Sepsis ([place]) [A41.9] 08/26/2015  . Pressure ulcer [L89.90] 08/26/2015  . Chest pain [R07.9] 06/03/2015  . Chronic systolic CHF (congestive heart failure) ([place]) [I50.22] 01/04/2015  . GERD (gastroesophageal reflux disease) [K21.9] 01/04/2015  . HTN (hypertension) [I10] 01/04/2015  . Anxiety [F41.9] 01/04/2015  . Chronic a-fib ([place]) [I48.2] 01/04/2015  . Dementia [F03.90] 01/04/2015  . COPD (chronic obstructive pulmonary disease) ([place]) [J44.9] 01/04/2015  . BPH (benign prostatic hyperplasia) [N40.0] 01/04/2015  . HCAP (healthcare-associated pneumonia) [J18.9] 07/04/2014  . COPD exacerbation ([place]) [J44.1] 07/04/2014  . Aneurysm of aorta ([facility]) [I71.9] 07/04/2014    Total Time spent with patient: 30 minutes  Subjective:   Imanol Bihl is a 80 y.o. male patient admitted with "yes I'm depressed".  HPI:  Patient interviewed more successfully today than yesterday. Patient tells me that he thinks he is physically slightly better but still feels pretty sick. Has been coughing up sputum all day. His description of his mood is inconsistent. He did not initially complain of depression but when questioned about suicidal ideation he said that he was having thoughts of wishing to die. He then said that if it weren't for the sitter in the room he would probably be dead. When asked about ways that he would try to harm himself he talks about punching himself on the chest although the  sitter reports he has not actually been doing that today. Patient's affect is blunted and somewhat anxious. He denies any hallucinations. Appears to be oriented to his basic situation. I talked with the nursing staff about him. They have seen that he is very anxious and attention seeking. Think that he probably just gets frightened not having people around him. Do not he is so much likely to harm himself intentionally as it is that he may climb out of bed due to confusion.  Social history: Patient is living in an assisted living facility.  Medical history: Multiple medical problems including dementia atrial fibrillation hypertension gastric reflux congestive heart failure COPD  Substance abuse history: Noncontributory  Past Psychiatric History: Past history of recurrent depression. No history of suicide attempts.  Risk to Self: Is patient at risk for suicide?: No Risk to Others:   Prior Inpatient Therapy:   Prior Outpatient Therapy:    Past Medical History:  Past Medical History  Diagnosis Date  . Chronic systolic CHF (congestive heart failure) ([place])   . COPD (chronic obstructive pulmonary disease) ([place])   . Aorta aneurysm ([place])   . Asthma   . Hypertension   . Anxiety   . A-fib ([place])   . Depression   . Hypothyroidism   . GERD (gastroesophageal reflux disease)   . Dementia   . BPH (benign prostatic hyperplasia)     Past Surgical History  Procedure Laterality Date  . Coronary artery bypass graft    . Prostate surgery    . Abdominal aortic aneurysm repair     Family History:  Family History  Problem Relation Age of Onset  . CAD Father   . Cancer Father  Family Psychiatric  History: Patient does not know of any family history of depression Social History:  History  Alcohol Use No     History  Drug Use No    Social History   Social History  . Marital Status: Widowed    Spouse Name: N/A  . Number of Children: N/A  . Years of Education: N/A   Social History Main  Topics  . Smoking status: Former Smoker -- 76 years  . Smokeless tobacco: None  . Alcohol Use: No  . Drug Use: No  . Sexual Activity: Not Asked   Other Topics Concern  . None   Social History Narrative   Additional Social History:    Allergies:  No Known Allergies  Labs:  Results for orders placed or performed during the hospital encounter of 09/08/15 (from the past 48 hour(s))  Troponin I     Status: Abnormal   Collection Time: 09/08/15 11:44 PM  Result Value Ref Range   Troponin I 0.03 (HH) <0.03 ng/mL    Comment: CRITICAL VALUE NOTED. VALUE IS CONSISTENT WITH PREVIOUSLY REPORTED/CALLED VALUE.PMH  Basic metabolic panel     Status: Abnormal   Collection Time: 09/09/15  4:25 AM  Result Value Ref Range   Sodium 140 135 - 145 mmol/L   Potassium 3.4 (L) 3.5 - 5.1 mmol/L   Chloride 107 101 - 111 mmol/L   CO2 28 22 - 32 mmol/L   Glucose, Bld 88 65 - 99 mg/dL   BUN 18 6 - 20 mg/dL   Creatinine, Ser 0.84 0.61 - 1.24 mg/dL   Calcium 8.4 (L) 8.9 - 10.3 mg/dL   GFR calc non Af Amer >60 >60 mL/min   GFR calc Af Amer >60 >60 mL/min    Comment: (NOTE) The eGFR has been calculated using the CKD EPI equation. This calculation has not been validated in all clinical situations. eGFR's persistently <60 mL/min signify possible Chronic Kidney Disease.    Anion gap 5 5 - 15  CBC     Status: Abnormal   Collection Time: 09/09/15  4:25 AM  Result Value Ref Range   WBC 6.4 3.8 - 10.6 K/uL   RBC 3.97 (L) 4.40 - 5.90 MIL/uL   Hemoglobin 11.0 (L) 13.0 - 18.0 g/dL   HCT 32.8 (L) 40.0 - 52.0 %   MCV 82.7 80.0 - 100.0 fL   MCH 27.6 26.0 - 34.0 pg   MCHC 33.4 32.0 - 36.0 g/dL   RDW 17.5 (H) 11.5 - 14.5 %   Platelets 175 150 - 440 K/uL  Troponin I     Status: None   Collection Time: 09/09/15  4:25 AM  Result Value Ref Range   Troponin I <0.03 <0.03 ng/mL  Troponin I     Status: None   Collection Time: 09/09/15  9:37 AM  Result Value Ref Range   Troponin I <0.03 <0.03 ng/mL    Current  Facility-Administered Medications  Medication Dose Route Frequency Provider Last Rate Last Dose  . acetaminophen (TYLENOL) tablet 650 mg  650 mg Oral Q6H PRN Lance Coon, MD       Or  . acetaminophen (TYLENOL) suppository 650 mg  650 mg Rectal Q6H PRN Lance Coon, MD      . albuterol (PROVENTIL) (2.5 MG/3ML) 0.083% nebulizer solution 2.5 mg  2.5 mg Nebulization Q3H PRN Wilhelmina Mcardle, MD      . albuterol (PROVENTIL) (2.5 MG/3ML) 0.083% nebulizer solution 2.5 mg  2.5 mg Nebulization Q6H Zella Richer  Simonds, MD   2.5 mg at 09/10/15 0721  . ALPRAZolam Duanne Moron) tablet 0.25 mg  0.25 mg Oral Q8H PRN Lance Coon, MD   0.25 mg at 09/10/15 1238  . amoxicillin-clavulanate (AUGMENTIN) 875-125 MG per tablet 1 tablet  1 tablet Oral Q12H Gladstone Lighter, MD   1 tablet at 09/10/15 0940  . aspirin EC tablet 81 mg  81 mg Oral QPM Lance Coon, MD   81 mg at 09/09/15 1754  . brimonidine (ALPHAGAN) 0.2 % ophthalmic solution 1 drop  1 drop Both Eyes BID Lance Coon, MD   1 drop at 09/09/15 0918   And  . timolol (TIMOPTIC) 0.5 % ophthalmic solution 1 drop  1 drop Both Eyes BID Lance Coon, MD   1 drop at 09/09/15 0917  . budesonide (PULMICORT) nebulizer solution 0.25 mg  0.25 mg Nebulization Q6H Wilhelmina Mcardle, MD   0.25 mg at 09/10/15 0721  . clonazePAM (KLONOPIN) tablet 0.5 mg  0.5 mg Oral BID Gladstone Lighter, MD   0.5 mg at 09/10/15 1745  . dicyclomine (BENTYL) tablet 20 mg  20 mg Oral Q8H PRN Lance Coon, MD      . digoxin Fonnie Birkenhead) tablet 0.125 mg  0.125 mg Oral Daily Lance Coon, MD   0.125 mg at 09/10/15 0940  . docusate sodium (COLACE) capsule 100 mg  100 mg Oral BID Gladstone Lighter, MD   100 mg at 09/10/15 1745  . dorzolamide (TRUSOPT) 2 % ophthalmic solution 1 drop  1 drop Both Eyes BID Lance Coon, MD   1 drop at 09/09/15 0917  . enoxaparin (LOVENOX) injection 40 mg  40 mg Subcutaneous Q24H Lance Coon, MD   40 mg at 09/09/15 2148  . escitalopram (LEXAPRO) tablet 15 mg  15 mg Oral Daily Lance Coon, MD   15 mg at 09/10/15 0939  . finasteride (PROSCAR) tablet 5 mg  5 mg Oral Daily Lance Coon, MD   5 mg at 09/10/15 0940  . furosemide (LASIX) tablet 40 mg  40 mg Oral Brigid Re, MD   40 mg at 09/10/15 3762  . guaiFENesin-dextromethorphan (ROBITUSSIN DM) 100-10 MG/5ML syrup 5 mL  5 mL Oral Q4H PRN Gladstone Lighter, MD   5 mL at 09/09/15 1228  . latanoprost (XALATAN) 0.005 % ophthalmic solution 1 drop  1 drop Both Eyes QHS Lance Coon, MD   1 drop at 09/08/15 2200  . levothyroxine (SYNTHROID, LEVOTHROID) tablet 25 mcg  25 mcg Oral Brigid Re, MD   25 mcg at 09/10/15 [phone]  . lisinopril (PRINIVIL,ZESTRIL) tablet 2.5 mg  2.5 mg Oral Daily Lance Coon, MD   2.5 mg at 09/10/15 0940  . metoprolol tartrate (LOPRESSOR) tablet 12.5 mg  12.5 mg Oral BID Lance Coon, MD   12.5 mg at 09/10/15 0940  . mirtazapine (REMERON) tablet 45 mg  45 mg Oral QHS Gonzella Lex, MD   45 mg at 09/09/15 2149  . morphine 2 MG/ML injection 2 mg  2 mg Intravenous Q4H PRN Lance Coon, MD      . morphine CONCENTRATE 10 MG/0.5ML oral solution 5 mg  5 mg Oral Q2H PRN Lance Coon, MD      . ondansetron [facility]) tablet 4 mg  4 mg Oral Q6H PRN Lance Coon, MD       Or  . ondansetron [facility]) injection 4 mg  4 mg Intravenous Q6H PRN Lance Coon, MD      . pantoprazole (PROTONIX) EC tablet 40 mg  40  mg Oral Daily Lance Coon, MD   40 mg at 09/10/15 0946  . predniSONE (DELTASONE) tablet 40 mg  40 mg Oral Q breakfast Lance Coon, MD   40 mg at 09/10/15 0939  . QUEtiapine (SEROQUEL) tablet 25 mg  25 mg Oral BID Lance Coon, MD   25 mg at 09/10/15 1238  . QUEtiapine (SEROQUEL) tablet 50 mg  50 mg Oral QHS Lance Coon, MD   50 mg at 09/09/15 2148  . risperiDONE (RISPERDAL M-TABS) disintegrating tablet 0.5 mg  0.5 mg Oral BID Lance Coon, MD   0.5 mg at 09/10/15 0939  . senna (SENOKOT) tablet 8.6 mg  1 tablet Oral Daily Gladstone Lighter, MD      . sodium chloride flush (NS) 0.9 % injection 3 mL  3  mL Intravenous Q12H Lance Coon, MD   3 mL at 09/10/15 0947  . traMADol (ULTRAM) tablet 50 mg  50 mg Oral Q6H PRN Gladstone Lighter, MD   50 mg at 09/10/15 0940    Musculoskeletal: Strength & Muscle Tone: decreased Gait & Station: unable to stand Patient leans: N/A  Psychiatric Specialty Exam: Physical Exam  Nursing note and vitals reviewed. HENT:  Head: Normocephalic and atraumatic.  Eyes: Conjunctivae are normal. Pupils are equal, round, and reactive to light.  Neck: Normal range of motion.  Cardiovascular: Normal heart sounds.   Respiratory: Effort normal. No respiratory distress.  GI: Soft.  Musculoskeletal: Normal range of motion.  Neurological: He is alert.  Skin: Skin is warm and dry.  Psychiatric: His mood appears anxious. His speech is delayed. He is slowed. Cognition and memory are impaired. He expresses impulsivity. He exhibits a depressed mood. He expresses suicidal ideation. He expresses no suicidal plans.    Review of Systems  HENT: Negative.   Eyes: Negative.   Respiratory: Positive for cough, sputum production and shortness of breath.   Cardiovascular: Negative.   Gastrointestinal: Negative.   Musculoskeletal: Negative.   Skin: Negative.   Neurological: Positive for weakness.  Psychiatric/Behavioral: Positive for depression, suicidal ideas and memory loss. Negative for hallucinations and substance abuse. The patient is nervous/anxious and has insomnia.     Blood pressure 118/65, pulse 64, temperature 97.8 F (36.6 C), temperature source Oral, resp. rate 18, height '5\' 9"'  (1.753 m), weight 61.644 kg (135 lb 14.4 oz), SpO2 94 %.Body mass index is 20.06 kg/(m^2).  General Appearance: Fairly Groomed  Eye Contact:  Minimal  Speech:  Slow  Volume:  Decreased  Mood:  Anxious and Depressed  Affect:  Constricted  Thought Process:  Disorganized  Orientation:  Full (Time, Place, and Person)  Thought Content:  Rumination  Suicidal Thoughts:  Yes.  without intent/plan   Homicidal Thoughts:  No  Memory:  Immediate;   Good Recent;   Fair Remote;   Fair  Judgement:  Impaired  Insight:  Shallow  Psychomotor Activity:  Decreased  Concentration:  Concentration: Fair  Recall:  [employer] of Knowledge:  Fair  Language:  Fair  Akathisia:  No  Handed:  Right  AIMS (if indicated):     Assets:  Housing Social Support  ADL's:  Impaired  Cognition:  Impaired,  Mild  Sleep:        Treatment Plan Summary: Daily contact with patient to assess and evaluate symptoms and progress in treatment, Medication management and Plan 80 year old man with multiple medical problems very anxious somewhat depressed. Talks about having wishes to kill himself but on the other hand is completely cooperative with  medical care and clearly is strongly afraid of getting sicker. Not actually actively attempting to kill himself but he does get confused and agitated at times and tries to climb out of the bed. I reviewed the situation with nursing who feel that they can possibly start transition him to "safety rounding" although they will continue to be aware of his increased fall risk and keep an eye on him. As far as depression I will increase his Lexapro to 20 mg a day. Continue daily follow-up. I don't think it will probably be necessary to have him go to geriatric psychiatry if we can just keep treating his mood and medical symptoms now.  Disposition: Patient does not meet criteria for psychiatric inpatient admission. Supportive therapy provided about ongoing stressors.  Alethia Berthold, MD 09/10/2015 6:58 PM

## 2015-09-10 NOTE — Progress Notes (Signed)
Patient refused vital signs at 0000 and 0400. Pt also refuses to wear continuous pulse ox and will remove if placed on patients finger. Sitter at bedside.

## 2015-09-10 NOTE — Progress Notes (Signed)
[facility] [place] [facility]   PATIENT NAME: Darrell Mcconnell    MR#:  [id number]  DATE OF BIRTH:  09-24-1935  SUBJECTIVE:  CHIEF COMPLAINT:   Chief Complaint  Patient presents with  . Cough  . Pleurisy   - CT on adm with possible mucus plug and RML collapse - sats stable on 2-3L o2 which is chronic - patient continues to be anxious- Hitting on his chest at times. Says he is feeling very lonely and starts becoming tearful. Feels nobody is caring for him, no bruits worried about him. -Sitter at bedside.  REVIEW OF SYSTEMS:  Review of Systems  Constitutional: Positive for malaise/fatigue. Negative for fever and chills.  HENT: Positive for hearing loss. Negative for ear discharge, ear pain and nosebleeds.   Eyes:       Legally blind  Respiratory: Positive for cough and shortness of breath. Negative for wheezing.   Cardiovascular: Negative for chest pain, palpitations and leg swelling.  Gastrointestinal: Negative for nausea, vomiting, abdominal pain, diarrhea and constipation.  Genitourinary: Negative for dysuria.  Neurological: Negative for dizziness, speech change, focal weakness, seizures and headaches.  Psychiatric/Behavioral: Positive for depression. The patient is nervous/anxious.     DRUG ALLERGIES:  No Known Allergies  VITALS:  Blood pressure 118/65, pulse 64, temperature 97.8 F (36.6 C), temperature source Oral, resp. rate 18, height 5\' 9"  (1.753 m), weight 61.644 kg (135 lb 14.4 oz), SpO2 94 %.  PHYSICAL EXAMINATION:  Physical Exam  GENERAL:  80 y.o.-year-old patient lying in the bed, anxious and tearful.  EYES: Pupils equal, round, reactive to light and accommodation. No scleral icterus. Extraocular muscles intact. Legally blind. HEENT: Head atraumatic, normocephalic. Oropharynx and nasopharynx clear.  NECK:  Supple, no jugular venous distention. No thyroid enlargement, no tenderness.  LUNGS: scant breath sounds bilaterally, decreased  right base. , no wheezing, rales,rhonchi or crepitation. No use of accessory muscles of respiration.  CARDIOVASCULAR: S1, S2 normal. No rubs, or gallops. 3/6 systolic murmur present. ABDOMEN: Soft, nontender, nondistended. Bowel sounds present. No organomegaly or mass.  EXTREMITIES: No pedal edema, cyanosis, or clubbing.  NEUROLOGIC: Cranial nerves II through XII are intact. Global weakness noted, lower extremity strength 3/5, upper extremities 5/5. Sensation intact. Gait not checked.  PSYCHIATRIC: The patient is alert and oriented x 1-2. Anxious and emotional, talking about dying. SKIN: No obvious rash, lesion, or ulcer.    LABORATORY PANEL:   CBC  Recent Labs Lab 09/09/15 0425  WBC 6.4  HGB 11.0*  HCT 32.8*  PLT 175   ------------------------------------------------------------------------------------------------------------------  Chemistries   Recent Labs Lab 09/08/15 1742 09/09/15 0425  NA 140 140  K 3.8 3.4*  CL 104 107  CO2 28 28  GLUCOSE 91 88  BUN 20 18  CREATININE 0.78 0.84  CALCIUM 8.9 8.4*  AST 24  --   ALT 20  --   ALKPHOS 72  --   BILITOT 0.7  --    ------------------------------------------------------------------------------------------------------------------  Cardiac Enzymes  Recent Labs Lab 09/09/15 0937  TROPONINI <0.03   ------------------------------------------------------------------------------------------------------------------  RADIOLOGY:  Dg Chest 2 View  09/10/2015  CLINICAL DATA:  Patient with history of right lung collapse. EXAM: CHEST  2 VIEW COMPARISON:  Chest CT 09/08/2015; chest radiograph 09/08/2015. FINDINGS: Patient is rotated to the left. Stable cardiac and mediastinal contours with tortuosity of the thoracic aorta. Patient status post median sternotomy. Monitoring leads overlie the patient. Elevation of the left hemidiaphragm. Small bilateral right greater than left pleural effusions. Persistent  focal consolidation medial  right lower hemi thorax. No pneumothorax. Mid thoracic spine degenerative changes. Aortic vascular calcifications. IMPRESSION: Persistent consolidative opacity right lower hemi thorax most compatible with atelectasis as demonstrated on prior chest CT. Recommend continued radiographic followup to ensure resolution. Small bilateral pleural effusions. Electronically Signed   By: Annia Belt M.D.   On: 09/10/2015 08:13   Dg Chest 2 View  09/08/2015  CLINICAL DATA:  Patient with cough for unknown duration. EXAM: CHEST  2 VIEW COMPARISON:  Chest CT 01/08/2015; chest radiograph 08/28/2015. FINDINGS: Stable cardiac and mediastinal contours status post median sternotomy and CABG procedure. There is a persistent small right pleural effusion. Additionally there is suggestion of a more focal masslike opacity with the right lower hemi thorax. Multilevel degenerative disease of the thoracic spine. IMPRESSION: Persistent moderate right pleural effusion. Additionally there is suggestion of a more focal masslike opacity within the right lower hemi thorax which is nonspecific and may resent loculated fluid, atelectasis or underlying pulmonary mass. Recommend dedicated evaluation with chest CT. Electronically Signed   By: Annia Belt M.D.   On: 09/08/2015 17:37   Ct Chest W Contrast  09/08/2015  CLINICAL DATA:  Cough for few days. EXAM: CT CHEST WITH CONTRAST TECHNIQUE: Multidetector CT imaging of the chest was performed during intravenous contrast administration. CONTRAST:  75mL ISOVUE-300 IOPAMIDOL (ISOVUE-300) INJECTION 61% COMPARISON:  Chest x-ray 08/28/2015, 09/08/2015; CT chest 02/12/2011, 09/16/2012 FINDINGS: Mediastinum/Lymph Nodes: No masses, pathologically enlarged lymph nodes, or other significant abnormality. Normal heart size. Prior CABG. Thoracic aortic atherosclerosis. Lungs/Pleura: Small bilateral pleural effusions, right greater than left. Partial right lower lobe collapse without a centrally obstructing mass.  Small spiculated area of airspace disease in the right upper lobe likely reflecting an area of scarring unchanged compared with 02/12/2011. There are bilateral emphysematous changes. Upper abdomen: Suprarenal abdominal aortic aneurysm measuring 5.1 x 4.4 cm. Musculoskeletal: No chest wall mass or suspicious bone lesions identified. IMPRESSION: 1. Small bilateral pleural effusions, right greater than left. 2. Partial right lower lobe collapse without a centrally obstructing mass. This may be secondary to mucoid impaction. 3. Suprarenal abdominal aortic aneurysm measuring 5.1 x 4.4 cm. Electronically Signed   By: Elige Ko   On: 09/08/2015 19:41    EKG:   Orders placed or performed during the hospital encounter of 09/08/15  . ED EKG  . ED EKG    ASSESSMENT AND PLAN:   Darrell Mcconnell is a 80 y.o. male with a known history of Congestive heart failure systolic, COPD on chronic 2L home oxygen, hypertension, anxiety comes to the emergency room from his retirement home with increasing shortness of breath. Noted to have right lower lobe lung collapse from mucous plug.  #1 COPD exacerbation-continue prednisone taper. -Inhalers and nebulizer treatments. -Continue oxygen support.  #2 right middle lobe lung collapse from mucous plug- on Augmentin to cover for postobstructive pneumonia. -Cough medicines. -Tramadol for chest pain musculoskeletal from coughing. -Appreciate pulmonary consult. Repeat chest x-ray still has collapsed middle lobe of the lung from possible mucous plug. Discussed in detail with son over the phone about considering bronchoscopy versus conservative management as patient is not symptomatic and outpatient follow-up. -Son is not very involved in the care, he said either way is fine. Discussed with pulmonary and they have decided to follow up as outpatient.  #3 chronic diastolic CHF-stable. Continue lisinopril, metoprolol, Lasix.  Well compensated.  #4 depression and anxiety-on Remeron  and Lexapro.  Also started on risperidone last admission.  -Patient continues to  talk about dying, not getting enough attention and care. He feels lonely and depressed. Appreciate psych input. -We'll start Klonopin twice a day. Also palliative care consult. -Also on Xanax as needed  #5 paroxysmal atrial fibrillation-on digoxin. Also on metoprolol. -Not on anticoagulation due to risk of bleeding.  #6 DVT prophylaxis-on Lovenox   Discussed with son over the phone.    All the records are reviewed and case discussed with Care Management/Social Workerr. Management plans discussed with the patient, family and they are in agreement.  CODE STATUS: DNR  TOTAL TIME TAKING CARE OF THIS PATIENT: 33 minutes.   POSSIBLE D/C IN 2DAYS, DEPENDING ON CLINICAL CONDITION.   Enid Baas M.D on 09/10/2015 at 1:20 PM  Between 7am to 6pm - Pager - [phone]  After 6pm go to [email or web address] - password [email or web address] [facility]  [facility]  Office  [phone]  CC: Primary care physician; Lyndon Code, MD

## 2015-09-10 NOTE — Consult Note (Signed)
Consultation Note Date: 09/10/2015   Patient Name: Darrell Mcconnell  DOB: Jan 01, 1936  MRN: [id number]  Age / Sex: 80 y.o., male  PCP: Lyndon CodeFozia M Khan, MD Referring Physician: Enid Baasadhika Kalisetti, MD  Reason for Consultation: Establishing goals of care, Non pain symptom management, Pain control and Psychosocial/spiritual support  HPI/Patient Profile: 80 y.o. male   admitted on 09/08/2015 with  several days of increasing cough with shortness of breath. Patient came in to the ED  for evaluation and was found to have a very small collapse of his right lower lobe on CT scan question of some potential mucoid plugging. PMH of Congestive heart failure systolic, COPD on chronic home oxygen, hypertension  He was also noted to likely be in COPD exacerbation, troponin was borderline positive in the ED.  Multiple hospitalizations in the past several months  Complicated psych history of depression and anxiety.  Most recently dementia with behavioral disturbances.  Family faced with advanced directive decisions and anticipatory care needs.       Clinical Assessment and Goals of Care:  This NP Lorinda CreedMary Falan Hensler reviewed medical records, received report from team, assessed the patient and then meet at the patient's bedside along with his son Nelma RothmanDaryl Fate/ main decision maker  to discuss diagnosis, prognosis, GOC, EOL wishes disposition and options.   A detailed discussion was had today regarding advanced directives.  Concepts specific to code status, artifical feeding and hydration, continued IV antibiotics and rehospitalization was had.  The difference between a aggressive medical intervention path  and a palliative comfort care path for this patient at this time was had.  Values and goals of care important to patient and family were attempted to be elicited.  Concept of Hospice and Palliative Care were discussed  Natural trajectory and  expectations at EOL were discussed. MOST form completed     Questions and concerns addressed.   Family encouraged to call with questions or concerns.  PMT will continue to support holistically.     SUMMARY OF RECOMMENDATIONS    Code Status/Advance Care Planning:  DNR   Focus is comfort, quality and dignity.  Son states "he has been suffering a long time"   Symptom Management:   Dyspnea/Pain: Roxanol 5 mg po/sl every 3 hrs prn   Agitation: Increase Risperdal to 1 mg po bid                  Increase Seroquel to 100 mg po q hs                  Discontinue Remeron and Xanax   Palliative Prophylaxis:   Bowel Regimen, Frequent Pain Assessment and Oral Care  Additional Recommendations (Limitations, Scope, Preferences):  Full Comfort Care  Psycho-social/Spiritual:   Desire for further Chaplaincy support:yes  Additional Recommendations: Education on Hospice  Prognosis:   < 6 months  Discharge Planning:    Family is open to any viable, quality  option.  Focus is comfort, hopefully Mr Piedad ClimesDix can be place in a facility AL/SNF/ group  home (high risk for rapid decompensation) and also implement hospice services, palliative services at a minimum.      Primary Diagnoses: Present on Admission:  . COPD exacerbation (HCC) . HTN (hypertension) . GERD (gastroesophageal reflux disease) . Chronic systolic CHF (congestive heart failure) (HCC) . Chronic a-fib (HCC) . BPH (benign prostatic hyperplasia)  I have reviewed the medical record, interviewed the patient and family, and examined the patient. The following aspects are pertinent.  Past Medical History  Diagnosis Date  . Chronic systolic CHF (congestive heart failure) (HCC)   . COPD (chronic obstructive pulmonary disease) (HCC)   . Aorta aneurysm (HCC)   . Asthma   . Hypertension   . Anxiety   . A-fib (HCC)   . Depression   . Hypothyroidism   . GERD (gastroesophageal reflux disease)   . Dementia   . BPH (benign  prostatic hyperplasia)    Social History   Social History  . Marital Status: Widowed    Spouse Name: N/A  . Number of Children: N/A  . Years of Education: N/A   Social History Main Topics  . Smoking status: Former Smoker -- 50 years  . Smokeless tobacco: None  . Alcohol Use: No  . Drug Use: No  . Sexual Activity: Not Asked   Other Topics Concern  . None   Social History Narrative   Family History  Problem Relation Age of Onset  . CAD Father   . Cancer Father    Scheduled Meds: . albuterol  2.5 mg Nebulization Q6H  . amoxicillin-clavulanate  1 tablet Oral Q12H  . aspirin EC  81 mg Oral QPM  . brimonidine  1 drop Both Eyes BID   And  . timolol  1 drop Both Eyes BID  . budesonide (PULMICORT) nebulizer solution  0.25 mg Nebulization Q6H  . clonazePAM  0.5 mg Oral BID  . digoxin  0.125 mg Oral Daily  . docusate sodium  100 mg Oral BID  . dorzolamide  1 drop Both Eyes BID  . enoxaparin (LOVENOX) injection  40 mg Subcutaneous Q24H  . escitalopram  15 mg Oral Daily  . finasteride  5 mg Oral Daily  . furosemide  40 mg Oral BH-q7a  . latanoprost  1 drop Both Eyes QHS  . levothyroxine  25 mcg Oral BH-q7a  . lisinopril  2.5 mg Oral Daily  . metoprolol tartrate  12.5 mg Oral BID  . mirtazapine  45 mg Oral QHS  . pantoprazole  40 mg Oral Daily  . predniSONE  40 mg Oral Q breakfast  . QUEtiapine  25 mg Oral BID  . QUEtiapine  50 mg Oral QHS  . risperiDONE  0.5 mg Oral BID  . senna  1 tablet Oral Daily  . sodium chloride flush  3 mL Intravenous Q12H   Continuous Infusions:  PRN Meds:.acetaminophen **OR** acetaminophen, albuterol, ALPRAZolam, dicyclomine, guaiFENesin-dextromethorphan, morphine injection, morphine CONCENTRATE, ondansetron **OR** ondansetron (ZOFRAN) IV, traMADol Medications Prior to Admission:  Prior to Admission medications   Medication Sig Start Date End Date Taking? Authorizing Provider  acetaminophen (TYLENOL) 650 MG CR tablet Take 650 mg by mouth 2  (two) times daily as needed for pain.   Yes Historical Provider, MD  albuterol (PROVENTIL HFA;VENTOLIN HFA) 108 (90 Base) MCG/ACT inhaler Inhale 2 puffs into the lungs See admin instructions. Every 4 to 6 hours PRN for shortness of breath   Yes Historical Provider, MD  ALPRAZolam (XANAX) 0.25 MG tablet Take 1 tablet (0.25 mg total) by  mouth every 8 (eight) hours as needed for anxiety. 08/29/15  Yes Enid Baas, MD  alum & mag hydroxide-simeth (MAALOX/MYLANTA) 200-200-20 MG/5ML suspension Take 30 mLs by mouth every 6 (six) hours as needed for indigestion or heartburn. 08/29/15  Yes Enid Baas, MD  aspirin EC 81 MG tablet Take 81 mg by mouth every evening.    Yes Historical Provider, MD  bimatoprost (LUMIGAN) 0.01 % SOLN Place 1 drop into both eyes every evening.    Yes Historical Provider, MD  brimonidine-timolol (COMBIGAN) 0.2-0.5 % ophthalmic solution Place 1 drop into both eyes 2 (two) times daily.   Yes Historical Provider, MD  cholecalciferol 400 units tablet Take 1 tablet (400 Units total) by mouth daily. 08/29/15  Yes Enid Baas, MD  dicyclomine (BENTYL) 20 MG tablet Take 20 mg by mouth every 8 (eight) hours as needed. For diarrhea.   Yes Historical Provider, MD  digoxin (LANOXIN) 0.125 MG tablet Take 0.125 mg by mouth daily.   Yes Historical Provider, MD  docusate sodium (COLACE) 100 MG capsule Take 100 mg by mouth 2 (two) times daily.   Yes Historical Provider, MD  dorzolamide (TRUSOPT) 2 % ophthalmic solution Place 1 drop into both eyes 2 (two) times daily.   Yes Historical Provider, MD  escitalopram (LEXAPRO) 10 MG tablet Take 15 mg by mouth daily.   Yes Historical Provider, MD  finasteride (PROSCAR) 5 MG tablet Take 5 mg by mouth daily.   Yes Historical Provider, MD  Fluticasone-Salmeterol (ADVAIR) 250-50 MCG/DOSE AEPB Inhale 1 puff into the lungs every 12 (twelve) hours.    Yes Historical Provider, MD  furosemide (LASIX) 40 MG tablet Take 40 mg by mouth every morning.   Yes  Historical Provider, MD  hydrocortisone cream 1 % Apply 1 application topically daily as needed for itching.    Yes Historical Provider, MD  ipratropium-albuterol (DUONEB) 0.5-2.5 (3) MG/3ML SOLN Take 3 mLs by nebulization 3 (three) times daily as needed (for shortness of breath/wheezing).    Yes Historical Provider, MD  levothyroxine (SYNTHROID, LEVOTHROID) 25 MCG tablet Take 25 mcg by mouth every morning.    Yes Historical Provider, MD  lisinopril (PRINIVIL,ZESTRIL) 2.5 MG tablet Take 2.5 mg by mouth daily.   Yes Historical Provider, MD  meloxicam (MOBIC) 7.5 MG tablet Take 7.5 mg by mouth daily.   Yes Historical Provider, MD  metoprolol tartrate (LOPRESSOR) 25 MG tablet Take 1 tablet (25 mg total) by mouth 2 (two) times daily. Patient taking differently: Take 12.5 mg by mouth 2 (two) times daily.  08/29/15  Yes Enid Baas, MD  mirtazapine (REMERON) 30 MG tablet Take 30 mg by mouth at bedtime.   Yes Historical Provider, MD  Morphine Sulfate (MORPHINE CONCENTRATE) 10 MG/0.5ML SOLN concentrated solution Take 0.25 mLs (5 mg total) by mouth every 2 (two) hours as needed for shortness of breath. 08/29/15  Yes Enid Baas, MD  ondansetron (ZOFRAN) 4 MG tablet Take 4 mg by mouth 2 (two) times daily as needed for nausea or vomiting.   Yes Historical Provider, MD  pantoprazole (PROTONIX) 40 MG tablet Take 40 mg by mouth daily.   Yes Historical Provider, MD  polyethylene glycol (MIRALAX / GLYCOLAX) packet Take 17 g by mouth daily as needed for moderate constipation.   Yes Historical Provider, MD  Prenatal Vit-Fe Fumarate-FA (PREPLUS) 27-1 MG TABS Take 1 tablet by mouth daily.   Yes Historical Provider, MD  QUEtiapine (SEROQUEL) 25 MG tablet Take 25 mg by mouth 2 (two) times daily. At 12  pm and at 5 pm   Yes Historical Provider, MD  QUEtiapine (SEROQUEL) 50 MG tablet Take 50 mg by mouth at bedtime.   Yes Historical Provider, MD  risperiDONE (RISPERDAL M-TABS) 0.5 MG disintegrating tablet Take 1  tablet (0.5 mg total) by mouth 2 (two) times daily. 08/29/15  Yes Enid Baas, MD  simethicone (MYLICON) 80 MG chewable tablet Chew 80 mg by mouth every 4 (four) hours as needed for flatulence.   Yes Historical Provider, MD  Skin Protectants, Misc. (EUCERIN) cream Apply 1 application topically 2 (two) times daily. Pt applies to arms and legs.   Yes Historical Provider, MD  sodium phosphate (FLEET) enema Place 1 enema rectally as needed (for constipation). follow package directions   Yes Historical Provider, MD  azithromycin (ZITHROMAX) 500 MG tablet Take 1 tablet (500 mg total) by mouth daily. X 2 more days 08/29/15   Enid Baas, MD  cefUROXime (CEFTIN) 500 MG tablet Take 1 tablet (500 mg total) by mouth 2 (two) times daily with a meal. X 4 more days 08/29/15   Enid Baas, MD  furosemide (LASIX) 20 MG tablet Take 1 tablet (20 mg total) by mouth every morning. 08/29/15   Enid Baas, MD  predniSONE (STERAPRED UNI-PAK 21 TAB) 10 MG (21) TBPK tablet Take 1 tablet (10 mg total) by mouth daily. 5 tabs PO x 1 day 4 tabs PO x 1 day 3 tabs PO x 1 day 2 tabs PO x 1 day 1 tab PO x 1 day and stop 08/29/15   Enid Baas, MD   No Known Allergies Review of Systems  Unable to perform ROS: Dementia    Physical Exam  Constitutional: He appears cachectic. He is uncooperative. He appears ill.  Psychiatric: He is agitated. Cognition and memory are impaired.    Vital Signs: BP 118/65 mmHg  Pulse 64  Temp(Src) 97.8 F (36.6 C) (Oral)  Resp 18  Ht 5\' 9"  (1.753 m)  Wt 61.644 kg (135 lb 14.4 oz)  BMI 20.06 kg/m2  SpO2 94% Pain Assessment: Faces POSS *See Group Information*: S-Acceptable,Sleep, easy to arouse Pain Score: Asleep   SpO2: SpO2: 94 % O2 Device:SpO2: 94 % O2 Flow Rate: .O2 Flow Rate (L/min): 2 L/min  IO: Intake/output summary:  Intake/Output Summary (Last 24 hours) at 09/10/15 1521 Last data filed at 09/10/15 1610  Gross per 24 hour  Intake    676 ml  Output     600 ml  Net     76 ml    LBM:   Baseline Weight: Weight: 63.504 kg (140 lb) Most recent weight: Weight: 61.644 kg (135 lb 14.4 oz)     Palliative Assessment/Data: 40 % at best   Flowsheet Rows        Most Recent Value   Intake Tab    Referral Department  Hospitalist   Unit at Time of Referral  Orthopedic Unit   Palliative Care Primary Diagnosis  Pulmonary   Date Notified  09/10/15   Palliative Care Type  New Palliative care   Reason for referral  Clarify Goals of Care   Date of Admission  09/08/15   # of days IP prior to Palliative referral  2   Clinical Assessment    Psychosocial & Spiritual Assessment    Palliative Care Outcomes        Discussed with Dr Nemiah Commander  Time In: 0730 Time Out: 0900 Time Total: 90 min Greater than 50%  of this time was spent counseling and coordinating  care related to the above assessment and plan.  Signed by: Lorinda Creed, NP   Please contact Palliative Medicine Team phone at [phone] for questions and concerns.  For individual provider: See Loretha Stapler

## 2015-09-10 NOTE — Clinical Social Work Placement (Addendum)
   CLINICAL SOCIAL WORK PLACEMENT  NOTE  Date:  09/10/2015  Patient Details  Name: Darrell Mcconnell MRN: [id number] Date of Birth: 05-Apr-1935  Clinical Social Work is seeking post-discharge placement for this patient at the Skilled  Nursing Facility level of care (*CSW will initial, date and re-position this form in  chart as items are completed):  Yes   Patient/family provided with [facility] Clinical Social Work Department's list of facilities offering this level of care within the geographic area requested by the patient (or if unable, by the patient's family).  Yes   Patient/family informed of their freedom to choose among providers that offer the needed level of care, that participate in Medicare, Medicaid or managed care program needed by the patient, have an available bed and are willing to accept the patient.  Yes   Patient/family informed of [facility]'s ownership interest in [facility] and [facility], as well as of the fact that they are under no obligation to receive care at these facilities.  PASRR submitted to EDS on   09/10/15   PASRR number received on       Existing PASRR number confirmed on 09/10/15 ALF PASARR   FL2 transmitted to all facilities in geographic area requested by pt/family on 09/10/15     FL2 transmitted to all facilities within larger geographic area on       Patient informed that his/her managed care company has contracts with or will negotiate with certain facilities, including the following:            Patient/family informed of bed offers received.  Patient chooses bed at       Physician recommends and patient chooses bed at      Patient to be transferred to   on  .  Patient to be transferred to facility by       Patient family notified on   of transfer.  Name of family member notified:        PHYSICIAN       Additional Comment:    _______________________________________________ Darrell Mcconnell, Ladon ApplebaumBailey G, LCSW 09/10/2015, 4:49 PM

## 2015-09-10 NOTE — Progress Notes (Signed)
Patient has made several comments on how he wants to die and the end was coming soon. Ive tried several times to discuss his feelings with him but he just repeats he wants to die. Dr Nemiah CommanderKalisetti made aware. He has refused his eye drops but will takes his meds thus far.

## 2015-09-11 DIAGNOSIS — R079 Chest pain, unspecified: Secondary | ICD-10-CM

## 2015-09-11 DIAGNOSIS — F333 Major depressive disorder, recurrent, severe with psychotic symptoms: Secondary | ICD-10-CM

## 2015-09-11 DIAGNOSIS — Z515 Encounter for palliative care: Secondary | ICD-10-CM | POA: Insufficient documentation

## 2015-09-11 DIAGNOSIS — Z66 Do not resuscitate: Secondary | ICD-10-CM

## 2015-09-11 MED ORDER — MORPHINE SULFATE (CONCENTRATE) 10 MG/0.5ML PO SOLN: 5.0000 mg | ORAL | Status: AC | PRN

## 2015-09-11 MED ORDER — ACETAMINOPHEN 325 MG PO TABS: 650.0000 mg | ORAL_TABLET | Freq: Four times a day (QID) | ORAL | Status: AC | PRN

## 2015-09-11 MED ORDER — TRAMADOL HCL 50 MG PO TABS: 50.0000 mg | ORAL_TABLET | Freq: Four times a day (QID) | ORAL | Status: AC | PRN

## 2015-09-11 MED ORDER — CLONAZEPAM 0.5 MG PO TABS: 0.5000 mg | ORAL_TABLET | Freq: Two times a day (BID) | ORAL | Status: AC

## 2015-09-11 MED ORDER — RISPERIDONE 1 MG PO TBDP: 1.0000 mg | ORAL_TABLET | Freq: Two times a day (BID) | ORAL | Status: AC

## 2015-09-11 MED ORDER — MIRTAZAPINE 45 MG PO TABS: 45.0000 mg | ORAL_TABLET | Freq: Every day | ORAL | Status: DC

## 2015-09-11 MED ORDER — GUAIFENESIN-DM 100-10 MG/5ML PO SYRP: 5.0000 mL | ORAL_SOLUTION | ORAL | Status: AC | PRN

## 2015-09-11 MED ORDER — RISPERIDONE 0.5 MG PO TBDP
1.0000 mg | ORAL_TABLET | Freq: Two times a day (BID) | ORAL | Status: DC
  Administered 2015-09-11: 1 mg via ORAL
  Filled 2015-09-11: qty 2

## 2015-09-11 MED ORDER — RISPERIDONE 0.5 MG PO TBDP: 0.5000 mg | ORAL_TABLET | Freq: Two times a day (BID) | ORAL | Status: DC

## 2015-09-11 MED ORDER — ESCITALOPRAM OXALATE 20 MG PO TABS
20.0000 mg | ORAL_TABLET | Freq: Every day | ORAL | Status: AC
Start: 2015-09-11 — End: ?

## 2015-09-11 MED ORDER — METOPROLOL TARTRATE 25 MG PO TABS
12.5000 mg | ORAL_TABLET | Freq: Two times a day (BID) | ORAL | Status: AC
Start: 2015-09-11 — End: ?

## 2015-09-11 MED ORDER — ALPRAZOLAM 0.25 MG PO TABS: 0.2500 mg | ORAL_TABLET | Freq: Three times a day (TID) | ORAL | Status: AC | PRN

## 2015-09-11 MED ORDER — SENNA 8.6 MG PO TABS: 1.0000 | ORAL_TABLET | Freq: Every day | ORAL | Status: AC

## 2015-09-11 MED ORDER — QUETIAPINE FUMARATE 100 MG PO TABS: 100.0000 mg | ORAL_TABLET | Freq: Every day | ORAL | Status: AC

## 2015-09-11 MED ORDER — QUETIAPINE FUMARATE 100 MG PO TABS: 100.0000 mg | ORAL_TABLET | Freq: Every day | ORAL | Status: DC

## 2015-09-11 MED ORDER — AMOXICILLIN-POT CLAVULANATE 875-125 MG PO TABS: 1.0000 | ORAL_TABLET | Freq: Two times a day (BID) | ORAL | Status: AC

## 2015-09-11 NOTE — Progress Notes (Signed)
New referral [facility] at [facility] following discharge received from [facility]. Mr. Darrell Mcconnell is a 80 year old man admitted to [facility] on 7/16 for evaluation of chest pain. CT revealed a small collapse of his right lung with question of some mucoid plugging. He has a PMH that includes CHF, dementia, GERD, COPD oxygen dependent, BPH and hypothyroidism as well as depression and anxiety. [name] has been living in a small retirement/family care home and has increasing needs that cannot be met there any longer per chart note review. He and his son have met with Palliative Medicine NP Wadie Lessen and the plan is for discharge to [facility] with hospice services to follow. Of note Mr. Darrell Mcconnell has had Hospice services in the past and was discharged due to stability. Per chart note review he has lost 14 lbs since his last admission on 7/3, which was also for a COPD exacerbation. Patient seen sitting up in bed, alert and oriented to person and situation. He expressed concerns about not being able to return to his previous living arrangement, he did express some understanding of his need for a higher level of care. He is currently requiring 3 liters of oxygen via nasal cannula. Skin tear noted to his left elbow, foam dressing in place. He denied any pain or discomfort. Arms with several bruises, skin tear noted to left hand, patient does not report a fall. Patient's son Coralyn Pear is currently at work and will be contacted by [facility] referral after patient discharges. Patient information faxed to referral. Out of Facility DNR to accompany patient at discharge. Thank you for the opportunity to be involved in the care of this patient.  Flo Shanks RN, BSN, [facility] [place], [facility] : [facility] [phone] c

## 2015-09-11 NOTE — Progress Notes (Signed)
Per palliative NP patient will need long term care under hospice services. Clinical Child psychotherapistocial Worker (CSW) sent out bed search. CSW contacted patient's son [name] to present bed offers however he did not answer a voicemail was left. PASARR has been received. CSW will continue to follow and assist as needed.   Baker Hughes IncorporatedBailey Eros Montour, LCSW [phone]) [phone]

## 2015-09-11 NOTE — Care Management Important Message (Signed)
Important Message  Patient Details  Name: Darrell Mcconnell MRN: [id number] Date of Birth: Mar 23, 1935   Medicare Important Message Given:  Yes    [name] 09/11/2015, 10:09 AM

## 2015-09-11 NOTE — Progress Notes (Signed)
Per palliative NP patient will go to SNF with hospice care. Per Administrator, artsJonathon administrator at [facility] patient can come to [facility] today under long term care Medicaid with hospice. Clinical Social Worker (CSW) contacted patient's son Rachael FeeDarryl and made him aware of above. Son accepted bed offer and chose [facility]. [facility]. Patient is aware of above. [place] owner Haywood LassoLynette is aware of above. Please reconsult if future social work needs arise. CSW signing off.   [employer]Bailey Brentney Goldbach, LCSW [phone]

## 2015-09-11 NOTE — Discharge Summary (Addendum)
[facility] - [place] at [facility]   PATIENT NAME: Darrell Mcconnell    MR#:  [id number]  DATE OF BIRTH:  05/30/1935  DATE OF ADMISSION:  09/08/2015 ADMITTING PHYSICIAN: Oralia Manis, MD  DATE OF DISCHARGE: 09/11/15  PRIMARY CARE PHYSICIAN: Lyndon Code, MD    ADMISSION DIAGNOSIS:  Chest pain, unspecified chest pain type [R07.9]  DISCHARGE DIAGNOSIS:  Principal Problem:   Depression, major, recurrent, severe with psychosis (HCC) Active Problems:   COPD exacerbation (HCC)   Chronic systolic CHF (congestive heart failure) (HCC)   GERD (gastroesophageal reflux disease)   HTN (hypertension)   Chronic a-fib (HCC)   BPH (benign prostatic hyperplasia)   Dementia with behavioral disturbance   SECONDARY DIAGNOSIS:   Past Medical History  Diagnosis Date  . Chronic systolic CHF (congestive heart failure) (HCC)   . COPD (chronic obstructive pulmonary disease) (HCC)   . Aorta aneurysm (HCC)   . Asthma   . Hypertension   . Anxiety   . A-fib (HCC)   . Depression   . Hypothyroidism   . GERD (gastroesophageal reflux disease)   . Dementia   . BPH (benign prostatic hyperplasia)     HOSPITAL COURSE:   Darrell Mcconnell is a 80 y.o. male with a known history of Congestive heart failure systolic, COPD on chronic 2L home oxygen, hypertension, anxiety comes to the emergency room from his retirement home with increasing shortness of breath. Noted to have right lower lobe lung collapse from mucous plug.  #1 COPD exacerbation-improved, stop prednisone. -Inhalers and nebulizer treatments as needed. -Continue oxygen support.  #2 Right middle lobe lung collapse from mucous plug- on Augmentin to cover for postobstructive pneumonia. -Cough medicines. -Tramadol for chest pain musculoskeletal from coughing. -Appreciate pulmonary consult. Repeat chest x-ray still has collapsed middle lobe of the lung from possible mucous plug. Discussed in detail with son over the phone about  considering bronchoscopy versus conservative management as patient is not symptomatic and outpatient follow-up. -Per son, no aggressive measures, hospice follow-up.  #3 chronic diastolic CHF-stable. Continue lisinopril, metoprolol, Lasix.  Well compensated.  #4 depression and anxiety-on Lexapro. Also on Seroquel. -Appreciate palliative care consult. After discussion with son, patient is considered to be transferred to long-term care with hospice follow-up. No plans for hospitalization. Symptom management recommended. Also on risperidone and klonopin  -Patient continues to talk about dying, not getting enough attention and care. He feels lonely and depressed. Appreciate psych input. No active suicidal thoughts, still worried about his health. Continued monitoring even as outpatient. -on Xanax as needed  #5 paroxysmal atrial fibrillation-on digoxin. Also on metoprolol. -Not on anticoagulation due to risk of bleeding.  Discharge to LTC with hospice today  DISCHARGE CONDITIONS:   Guarded  CONSULTS OBTAINED:  Treatment Team:  Merwyn Katos, MD Audery Amel, MD  DRUG ALLERGIES:  No Known Allergies  DISCHARGE MEDICATIONS:   Current Discharge Medication List    START taking these medications   Details  acetaminophen (TYLENOL) 325 MG tablet Take 2 tablets (650 mg total) by mouth every 6 (six) hours as needed for mild pain (or Fever >/= 101). Qty: 30 tablet, Refills: 2    amoxicillin-clavulanate (AUGMENTIN) 875-125 MG tablet Take 1 tablet by mouth every 12 (twelve) hours. X 5 more days Qty: 10 tablet, Refills: 0    clonazePAM (KLONOPIN) 0.5 MG tablet Take 1 tablet (0.5 mg total) by mouth 2 (two) times daily. Qty: 30 tablet, Refills: 0    guaiFENesin-dextromethorphan (ROBITUSSIN DM) 100-10  MG/5ML syrup Take 5 mLs by mouth every 4 (four) hours as needed for cough. Qty: 118 mL, Refills: 0    senna (SENOKOT) 8.6 MG TABS tablet Take 1 tablet (8.6 mg total) by mouth  daily. Qty: 120 each, Refills: 0    traMADol (ULTRAM) 50 MG tablet Take 1 tablet (50 mg total) by mouth every 6 (six) hours as needed for moderate pain. Qty: 20 tablet, Refills: 0      CONTINUE these medications which have CHANGED   Details  ALPRAZolam (XANAX) 0.25 MG tablet Take 1 tablet (0.25 mg total) by mouth every 8 (eight) hours as needed for anxiety. Qty: 20 tablet, Refills: 0    escitalopram (LEXAPRO) 20 MG tablet Take 1 tablet (20 mg total) by mouth daily. Qty: 30 tablet, Refills: 2    metoprolol tartrate (LOPRESSOR) 25 MG tablet Take 0.5 tablets (12.5 mg total) by mouth 2 (two) times daily. Qty: 60 tablet, Refills: 2    mirtazapine (REMERON) 45 MG tablet Take 1 tablet (45 mg total) by mouth at bedtime. Qty: 30 tablet, Refills: 0    Morphine Sulfate (MORPHINE CONCENTRATE) 10 MG/0.5ML SOLN concentrated solution Take 0.25 mLs (5 mg total) by mouth every 2 (two) hours as needed for shortness of breath. Qty: 15 mL, Refills: 0    risperiDONE (RISPERDAL M-TABS) 0.5 MG disintegrating tablet Take 1 tablet (0.5 mg total) by mouth 2 (two) times daily. Qty: 30 tablet, Refills: 0      CONTINUE these medications which have NOT CHANGED   Details  albuterol (PROVENTIL HFA;VENTOLIN HFA) 108 (90 Base) MCG/ACT inhaler Inhale 2 puffs into the lungs See admin instructions. Every 4 to 6 hours PRN for shortness of breath    alum & mag hydroxide-simeth (MAALOX/MYLANTA) 200-200-20 MG/5ML suspension Take 30 mLs by mouth every 6 (six) hours as needed for indigestion or heartburn. Qty: 355 mL, Refills: 0    aspirin EC 81 MG tablet Take 81 mg by mouth every evening.     bimatoprost (LUMIGAN) 0.01 % SOLN Place 1 drop into both eyes every evening.     brimonidine-timolol (COMBIGAN) 0.2-0.5 % ophthalmic solution Place 1 drop into both eyes 2 (two) times daily.    cholecalciferol 400 units tablet Take 1 tablet (400 Units total) by mouth daily. Qty: 30 each, Refills: 0    dicyclomine (BENTYL) 20  MG tablet Take 20 mg by mouth every 8 (eight) hours as needed. For diarrhea.    digoxin (LANOXIN) 0.125 MG tablet Take 0.125 mg by mouth daily.    docusate sodium (COLACE) 100 MG capsule Take 100 mg by mouth 2 (two) times daily.    dorzolamide (TRUSOPT) 2 % ophthalmic solution Place 1 drop into both eyes 2 (two) times daily.    finasteride (PROSCAR) 5 MG tablet Take 5 mg by mouth daily.    Fluticasone-Salmeterol (ADVAIR) 250-50 MCG/DOSE AEPB Inhale 1 puff into the lungs every 12 (twelve) hours.     furosemide (LASIX) 40 MG tablet Take 40 mg by mouth every morning.    hydrocortisone cream 1 % Apply 1 application topically daily as needed for itching.     ipratropium-albuterol (DUONEB) 0.5-2.5 (3) MG/3ML SOLN Take 3 mLs by nebulization 3 (three) times daily as needed (for shortness of breath/wheezing).     levothyroxine (SYNTHROID, LEVOTHROID) 25 MCG tablet Take 25 mcg by mouth every morning.     lisinopril (PRINIVIL,ZESTRIL) 2.5 MG tablet Take 2.5 mg by mouth daily.    ondansetron (ZOFRAN) 4 MG tablet Take 4  mg by mouth 2 (two) times daily as needed for nausea or vomiting.    pantoprazole (PROTONIX) 40 MG tablet Take 40 mg by mouth daily.    polyethylene glycol (MIRALAX / GLYCOLAX) packet Take 17 g by mouth daily as needed for moderate constipation.    !! QUEtiapine (SEROQUEL) 25 MG tablet Take 25 mg by mouth 2 (two) times daily. At 12 pm and at 5 pm    !! QUEtiapine (SEROQUEL) 50 MG tablet Take 50 mg by mouth at bedtime.    simethicone (MYLICON) 80 MG chewable tablet Chew 80 mg by mouth every 4 (four) hours as needed for flatulence.    Skin Protectants, Misc. (EUCERIN) cream Apply 1 application topically 2 (two) times daily. Pt applies to arms and legs.    sodium phosphate (FLEET) enema Place 1 enema rectally as needed (for constipation). follow package directions     !! - Potential duplicate medications found. Please discuss with provider.    STOP taking these medications      acetaminophen (TYLENOL) 650 MG CR tablet      meloxicam (MOBIC) 7.5 MG tablet      Prenatal Vit-Fe Fumarate-FA (PREPLUS) 27-1 MG TABS      azithromycin (ZITHROMAX) 500 MG tablet      cefUROXime (CEFTIN) 500 MG tablet      predniSONE (STERAPRED UNI-PAK 21 TAB) 10 MG (21) TBPK tablet          DISCHARGE INSTRUCTIONS:   1. PCP f/u in 1-2 weeks 2. Pulmonary f/u in 2-3 weeks 3. Psychiatry follow up in 1 week 4. Hospice follow-up at the rehabilitation  If you experience worsening of your admission symptoms, develop shortness of breath, life threatening emergency, suicidal or homicidal thoughts you must seek medical attention immediately by calling 911 or calling your MD immediately  if symptoms less severe.  You Must read complete instructions/literature along with all the possible adverse reactions/side effects for all the Medicines you take and that have been prescribed to you. Take any new Medicines after you have completely understood and accept all the possible adverse reactions/side effects.   Please note  You were cared for by a hospitalist during your hospital stay. If you have any questions about your discharge medications or the care you received while you were in the hospital after you are discharged, you can call the unit and asked to speak with the hospitalist on call if the hospitalist that took care of you is not available. Once you are discharged, your primary care physician will handle any further medical issues. Please note that NO REFILLS for any discharge medications will be authorized once you are discharged, as it is imperative that you return to your primary care physician (or establish a relationship with a primary care physician if you do not have one) for your aftercare needs so that they can reassess your need for medications and monitor your lab values.    Today   CHIEF COMPLAINT:   Chief Complaint  Patient presents with  . Cough  . Pleurisy    VITAL  SIGNS:  Blood pressure 114/61, pulse 60, temperature 97.4 F (36.3 C), temperature source Oral, resp. rate 20, height 5\' 9"  (1.753 m), weight 58.605 kg (129 lb 3.2 oz), SpO2 95 %.  I/O:   Intake/Output Summary (Last 24 hours) at 09/11/15 0804 Last data filed at 09/11/15 0737  Gross per 24 hour  Intake      0 ml  Output   1050 ml  Net  -  1050 ml    PHYSICAL EXAMINATION:   Physical Exam  GENERAL: 80 y.o.-year-old patient lying in the bed, resting peacefully. Had an uneventful night. No sitter today.Marland Kitchen  EYES: Pupils equal, round, reactive to light and accommodation. No scleral icterus. Extraocular muscles intact. Legally blind. HEENT: Head atraumatic, normocephalic. Oropharynx and nasopharynx clear.  NECK: Supple, no jugular venous distention. No thyroid enlargement, no tenderness.  LUNGS: scant breath sounds bilaterally, decreased right base. , no wheezing, rales,rhonchi or crepitation. No use of accessory muscles of respiration.  CARDIOVASCULAR: S1, S2 normal. No rubs, or gallops. 3/6 systolic murmur present. ABDOMEN: Soft, nontender, nondistended. Bowel sounds present. No organomegaly or mass.  EXTREMITIES: No pedal edema, cyanosis, or clubbing.  NEUROLOGIC: Cranial nerves II through XII are intact. Global weakness noted, lower extremity strength 3/5, upper extremities 5/5. Sensation intact. Gait not checked.  PSYCHIATRIC: The patient is alert and oriented x 1-2. SKIN: No obvious rash, lesion, or ulcer.    DATA REVIEW:   CBC  Recent Labs Lab 09/09/15 0425  WBC 6.4  HGB 11.0*  HCT 32.8*  PLT 175    Chemistries   Recent Labs Lab 09/08/15 1742 09/09/15 0425  NA 140 140  K 3.8 3.4*  CL 104 107  CO2 28 28  GLUCOSE 91 88  BUN 20 18  CREATININE 0.78 0.84  CALCIUM 8.9 8.4*  AST 24  --   ALT 20  --   ALKPHOS 72  --   BILITOT 0.7  --     Cardiac Enzymes  Recent Labs Lab 09/09/15 0937  TROPONINI <0.03    Microbiology Results  Results for orders  placed or performed during the hospital encounter of 08/26/15  Blood Culture (routine x 2)     Status: None   Collection Time: 08/26/15  7:01 AM  Result Value Ref Range Status   Specimen Description BLOOD LEFT ARM  Final   Special Requests AER 5CC ANA 5CC  Final   Culture NO GROWTH 5 DAYS  Final   Report Status 08/31/2015 FINAL  Final  Urine culture     Status: Abnormal   Collection Time: 08/26/15  7:01 AM  Result Value Ref Range Status   Specimen Description URINE, CLEAN CATCH  Final   Special Requests NONE  Final   Culture MULTIPLE SPECIES PRESENT, SUGGEST RECOLLECTION (A)  Final   Report Status 08/27/2015 FINAL  Final  Blood Culture (routine x 2)     Status: None   Collection Time: 08/26/15  7:02 AM  Result Value Ref Range Status   Specimen Description BLOOD RIGHT WRIST  Final   Special Requests NONE  Final   Culture NO GROWTH 5 DAYS  Final   Report Status 08/31/2015 FINAL  Final  MRSA PCR Screening     Status: None   Collection Time: 08/26/15 10:21 AM  Result Value Ref Range Status   MRSA by PCR NEGATIVE NEGATIVE Final    Comment:        The GeneXpert MRSA Assay (FDA approved for NASAL specimens only), is one component of a comprehensive MRSA colonization surveillance program. It is not intended to diagnose MRSA infection nor to guide or monitor treatment for MRSA infections.     RADIOLOGY:  Dg Chest 2 View  09/10/2015  CLINICAL DATA:  Patient with history of right lung collapse. EXAM: CHEST  2 VIEW COMPARISON:  Chest CT 09/08/2015; chest radiograph 09/08/2015. FINDINGS: Patient is rotated to the left. Stable cardiac and mediastinal contours with tortuosity of the  thoracic aorta. Patient status post median sternotomy. Monitoring leads overlie the patient. Elevation of the left hemidiaphragm. Small bilateral right greater than left pleural effusions. Persistent focal consolidation medial right lower hemi thorax. No pneumothorax. Mid thoracic spine degenerative changes.  Aortic vascular calcifications. IMPRESSION: Persistent consolidative opacity right lower hemi thorax most compatible with atelectasis as demonstrated on prior chest CT. Recommend continued radiographic followup to ensure resolution. Small bilateral pleural effusions. Electronically Signed   By: Annia Belt M.D.   On: 09/10/2015 08:13    EKG:   Orders placed or performed during the hospital encounter of 09/08/15  . ED EKG  . ED EKG      Management plans discussed with the patient, family and they are in agreement.  CODE STATUS:     Code Status Orders        Start     Ordered   09/08/15 2142  Do not attempt resuscitation (DNR)   Continuous    Question Answer Comment  In the event of cardiac or respiratory ARREST Do not call a "code blue"   In the event of cardiac or respiratory ARREST Do not perform Intubation, CPR, defibrillation or ACLS   In the event of cardiac or respiratory ARREST Use medication by any route, position, wound care, and other measures to relive pain and suffering. May use oxygen, suction and manual treatment of airway obstruction as needed for comfort.      09/08/15 2141    Code Status History    Date Active Date Inactive Code Status Order ID Comments User Context   08/26/2015 10:07 AM 08/29/2015  7:04 PM DNR [id number]  Enedina Finner, MD Inpatient   06/03/2015  9:47 PM 06/04/2015  8:36 PM DNR [id number]  Houston Siren, MD Inpatient   02/20/2015  2:17 AM 02/20/2015  5:31 PM DNR [id number]  Oralia Manis, MD Inpatient   01/04/2015 10:15 PM 01/09/2015  5:25 PM DNR [id number]  Oralia Manis, MD Inpatient   07/04/2014 10:03 AM 07/05/2014  6:48 PM DNR [id number]  Altamese Dilling, MD Inpatient    Advance Directive Documentation        Most Recent Value   Type of Advance Directive  Out of facility DNR (pink MOST or yellow form)   Pre-existing out of facility DNR order (yellow form or pink MOST form)     "MOST" Form in Place?        TOTAL TIME TAKING CARE OF THIS  PATIENT: 37 minutes.    Enid Baas M.D on 09/11/2015 at 8:04 AM  Between 7am to 6pm - Pager - [phone]  After 6pm go to [email or web address] - password [email or web address] [facility]  [facility]  Office  [phone]  CC: Primary care physician; Lyndon Code, MD

## 2015-09-11 NOTE — NC FL2 (Signed)
MEDICAID FL2 LEVEL OF CARE SCREENING TOOL     IDENTIFICATION  Patient Name: Darrell Mcconnell Birthdate: 07-23-1935 Sex: male Admission Date (Current Location): 09/08/2015  [facility] and [place] Number:  [name]  ([id number]) Facility and Address:  [facility], [address] [place], [place] [address]      Provider Number: [id number]  Attending Physician Name and Address:  Enid Baas, MD  Relative Name and Phone Number:       Current Level of Care: Hospital Recommended Level of Care: Skilled Nursing Facility Prior Approval Number:    Date Approved/Denied:   PASRR Number:   [id number]   Discharge Plan: SNF    Current Diagnoses: Patient Active Problem List   Diagnosis Date Noted  . Pain in the chest   . DNR (do not resuscitate)   . Palliative care encounter   . Depression, major, recurrent, severe with psychosis (HCC) 09/09/2015  . Dementia with behavioral disturbance 09/09/2015  . Sepsis (HCC) 08/26/2015  . Pressure ulcer 08/26/2015  . Chest pain 06/03/2015  . Chronic systolic CHF (congestive heart failure) (HCC) 01/04/2015  . GERD (gastroesophageal reflux disease) 01/04/2015  . HTN (hypertension) 01/04/2015  . Anxiety 01/04/2015  . Chronic a-fib (HCC) 01/04/2015  . Dementia 01/04/2015  . COPD (chronic obstructive pulmonary disease) (HCC) 01/04/2015  . BPH (benign prostatic hyperplasia) 01/04/2015  . HCAP (healthcare-associated pneumonia) 07/04/2014  . COPD exacerbation (HCC) 07/04/2014  . Aneurysm of aorta (HCC) 07/04/2014    Orientation RESPIRATION BLADDER Height & Weight     Self  O2 (2 Liters Oxygen ) Continent Weight: 129 lb 3.2 oz (58.605 kg) Height:   (175.3 cm)  BEHAVIORAL SYMPTOMS/MOOD NEUROLOGICAL BOWEL NUTRITION STATUS   (none )  (none ) Continent Diet (Diet: Heart Healthy )  AMBULATORY STATUS COMMUNICATION OF NEEDS Skin   Extensive Assist Verbally PU Stage and Appropriate Care (Pressure Ulcer Stage 1:  Coccyx )                       Personal Care Assistance Level of Assistance  Bathing, Feeding, Dressing Bathing Assistance: Limited assistance Feeding assistance: Independent Dressing Assistance: Limited assistance     Functional Limitations Info  Sight, Hearing, Speech Sight Info: Impaired Hearing Info: Impaired Speech Info: Adequate    SPECIAL CARE FACTORS FREQUENCY  Will be followed by [facility]                 Contractures      Additional Factors Info  Code Status, Allergies, Psychotropic Code Status Info:  (DNR ) Allergies Info:  (No Known Allergies. )           Current Medications (09/11/2015):  This is the current hospital active medication list Current Facility-Administered Medications  Medication Dose Route Frequency Provider Last Rate Last Dose  . acetaminophen (TYLENOL) tablet 650 mg  650 mg Oral Q6H PRN Oralia Manis, MD       Or  . acetaminophen (TYLENOL) suppository 650 mg  650 mg Rectal Q6H PRN Oralia Manis, MD      . albuterol (PROVENTIL) (2.5 MG/3ML) 0.083% nebulizer solution 2.5 mg  2.5 mg Nebulization Q3H PRN Merwyn Katos, MD      . albuterol (PROVENTIL) (2.5 MG/3ML) 0.083% nebulizer solution 2.5 mg  2.5 mg Nebulization TID Enid Baas, MD   2.5 mg at 09/11/15 0858  . brimonidine (ALPHAGAN) 0.2 % ophthalmic solution 1 drop  1 drop Both Eyes BID Oralia Manis, MD  1 drop at 09/11/15 0903   And  . timolol (TIMOPTIC) 0.5 % ophthalmic solution 1 drop  1 drop Both Eyes BID Oralia Manisavid Willis, MD   1 drop at 09/11/15 0902  . budesonide (PULMICORT) nebulizer solution 0.25 mg  0.25 mg Nebulization BID Enid Baasadhika Kalisetti, MD   0.25 mg at 09/11/15 0858  . clonazePAM (KLONOPIN) tablet 0.5 mg  0.5 mg Oral BID Enid Baasadhika Kalisetti, MD   0.5 mg at 09/11/15 0857  . dicyclomine (BENTYL) tablet 20 mg  20 mg Oral Q8H PRN Oralia Manisavid Willis, MD      . digoxin Margit Banda(LANOXIN) tablet 0.125 mg  0.125 mg Oral Daily Oralia Manisavid Willis, MD   0.125 mg at 09/11/15  0857  . docusate sodium (COLACE) capsule 100 mg  100 mg Oral BID Enid Baasadhika Kalisetti, MD   100 mg at 09/11/15 0857  . dorzolamide (TRUSOPT) 2 % ophthalmic solution 1 drop  1 drop Both Eyes BID Oralia Manisavid Willis, MD   1 drop at 09/11/15 0901  . escitalopram (LEXAPRO) tablet 20 mg  20 mg Oral Daily Audery AmelJohn T Clapacs, MD   20 mg at 09/11/15 0856  . finasteride (PROSCAR) tablet 5 mg  5 mg Oral Daily Oralia Manisavid Willis, MD   5 mg at 09/11/15 0858  . furosemide (LASIX) tablet 40 mg  40 mg Oral Annett FabianBH-q7a David Willis, MD   40 mg at 09/11/15 0602  . guaiFENesin-dextromethorphan (ROBITUSSIN DM) 100-10 MG/5ML syrup 5 mL  5 mL Oral Q4H PRN Enid Baasadhika Kalisetti, MD   5 mL at 09/09/15 1228  . latanoprost (XALATAN) 0.005 % ophthalmic solution 1 drop  1 drop Both Eyes QHS Oralia Manisavid Willis, MD   1 drop at 09/10/15 2242  . levothyroxine (SYNTHROID, LEVOTHROID) tablet 25 mcg  25 mcg Oral Annett FabianBH-[id number] David Willis, MD   25 mcg at 09/11/15 0602  . lisinopril (PRINIVIL,ZESTRIL) tablet 2.5 mg  2.5 mg Oral Daily Oralia Manisavid Willis, MD   2.5 mg at 09/11/15 0858  . metoprolol tartrate (LOPRESSOR) tablet 12.5 mg  12.5 mg Oral BID Oralia Manisavid Willis, MD   12.5 mg at 09/11/15 0856  . morphine 2 MG/ML injection 2 mg  2 mg Intravenous Q4H PRN Oralia Manisavid Willis, MD   2 mg at 09/11/15 1046  . morphine CONCENTRATE 10 MG/0.5ML oral solution 5 mg  5 mg Oral Q2H PRN Oralia Manisavid Willis, MD      . ondansetron [facility](ZOFRAN) tablet 4 mg  4 mg Oral Q6H PRN Oralia Manisavid Willis, MD       Or  . ondansetron [facility](ZOFRAN) injection 4 mg  4 mg Intravenous Q6H PRN Oralia Manisavid Willis, MD      . pantoprazole (PROTONIX) EC tablet 40 mg  40 mg Oral Daily Oralia Manisavid Willis, MD   40 mg at 09/11/15 0856  . predniSONE (DELTASONE) tablet 40 mg  40 mg Oral Q breakfast Oralia Manisavid Willis, MD   40 mg at 09/11/15 0845  . QUEtiapine (SEROQUEL) tablet 100 mg  100 mg Oral QHS Canary BrimMary W Larach, NP      . QUEtiapine (SEROQUEL) tablet 25 mg  25 mg Oral BID Oralia Manisavid Willis, MD   25 mg at 09/11/15 1133  . risperiDONE (RISPERDAL M-TABS) disintegrating tablet  1 mg  1 mg Oral BID Canary BrimMary W Larach, NP   1 mg at 09/11/15 0858  . senna (SENOKOT) tablet 8.6 mg  1 tablet Oral Daily Enid Baasadhika Kalisetti, MD   8.6 mg at 09/11/15 0857  . sodium chloride flush (NS) 0.9 % injection 3 mL  3 mL Intravenous Q12H  Oralia Manis, MD   3 mL at 09/11/15 1610     Discharge Medications: Please see discharge summary for a list of discharge medications.  Relevant Imaging Results:  Relevant Lab Results:   Additional Information  (SSN: [id number])  Bryson Palen, Ladon Applebaum, LCSW

## 2015-09-11 NOTE — Clinical Social Work Placement (Signed)
   CLINICAL SOCIAL WORK PLACEMENT  NOTE  Date:  09/11/2015  Patient Details  Name: Darrell Mcconnell MRN: [id number] Date of Birth: 1935/07/21  Clinical Social Work is seeking post-discharge placement for this patient at the Skilled  Nursing Facility level of care (*CSW will initial, date and re-position this form in  chart as items are completed):  Yes   Patient/family provided with [facility] Department's list of facilities offering this level of care within the geographic area requested by the patient (or if unable, by the patient's family).  Yes   Patient/family informed of their freedom to choose among providers that offer the needed level of care, that participate in Medicare, Medicaid or managed care program needed by the patient, have an available bed and are willing to accept the patient.  Yes   Patient/family informed of [facility] ownership interest [facility] and [facility], as well as of the fact that they are under no obligation to receive care at these facilities.  PASRR submitted to EDS on       PASRR number received on       Existing PASRR number confirmed on 09/10/15     FL2 transmitted to all facilities in geographic area requested by pt/family on 09/10/15     FL2 transmitted to all facilities within larger geographic area on       Patient informed that his/her managed care company has contracts with or will negotiate with certain facilities, including the following:        Yes   Patient/family informed of bed offers received.  Patient chooses bed at  [facility] ([facility] )     Physician recommends and patient chooses bed at      Patient to be transferred to  [facility] ) on 09/11/15.  Patient to be transferred to facility by  [facility] )     Patient family notified on 09/11/15 of transfer.  Name of family member notified:   (Patinet's son Rachael FeeDarryl is aware of D/C today. )     PHYSICIAN       Additional  Comment:    _______________________________________________ Wladyslaw Henrichs, Ladon ApplebaumBailey G, LCSW 09/11/2015, 12:45 PM

## 2015-09-11 NOTE — Progress Notes (Signed)
Patient is being discharge to SNF in a stable condition, still with periods of confusion , report given to floor nurse , awaiting pick up by EMS

## 2015-09-18 ENCOUNTER — Inpatient Hospital Stay: Payer: Medicare Other | Admitting: Internal Medicine

## 2015-09-18 ENCOUNTER — Encounter: Payer: Self-pay | Admitting: *Deleted

## 2015-10-19 DIAGNOSIS — G300 Alzheimer's disease with early onset: Secondary | ICD-10-CM | POA: Diagnosis not present

## 2015-11-24 DEATH — deceased

## 2015-12-10 IMAGING — CR DG CHEST 2V
1 series · 2 of 2 positions shown · non-contrast
Comparison: 07/05/2014

CLINICAL DATA: Acute shortness of breath, COPD in CHF, former
smoker

EXAM:
CHEST  2 VIEW

[Series 1: dg chest 2 view · 0.14mm/px · 2 of 2 slices shown]
[im 1/2]
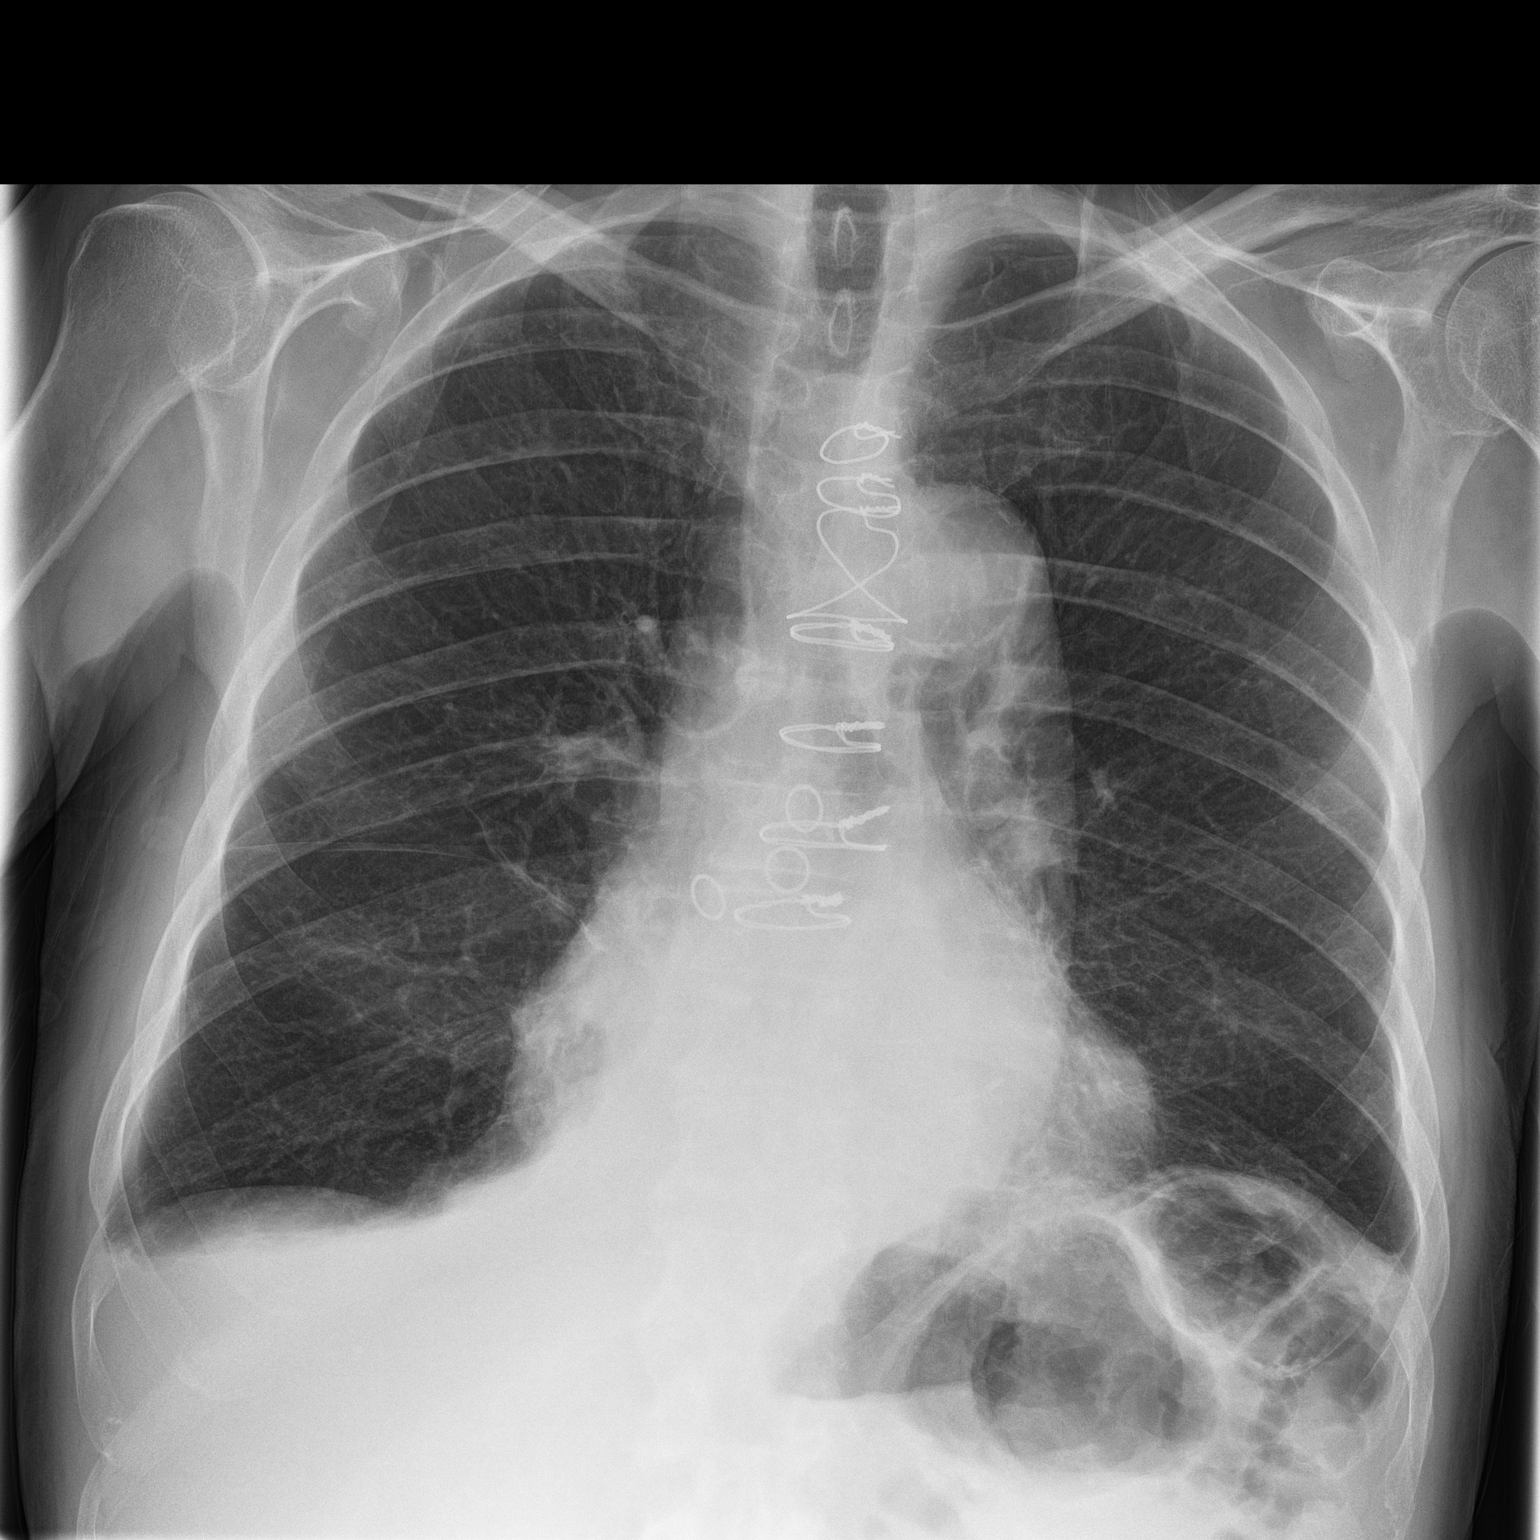
[im 2/2]
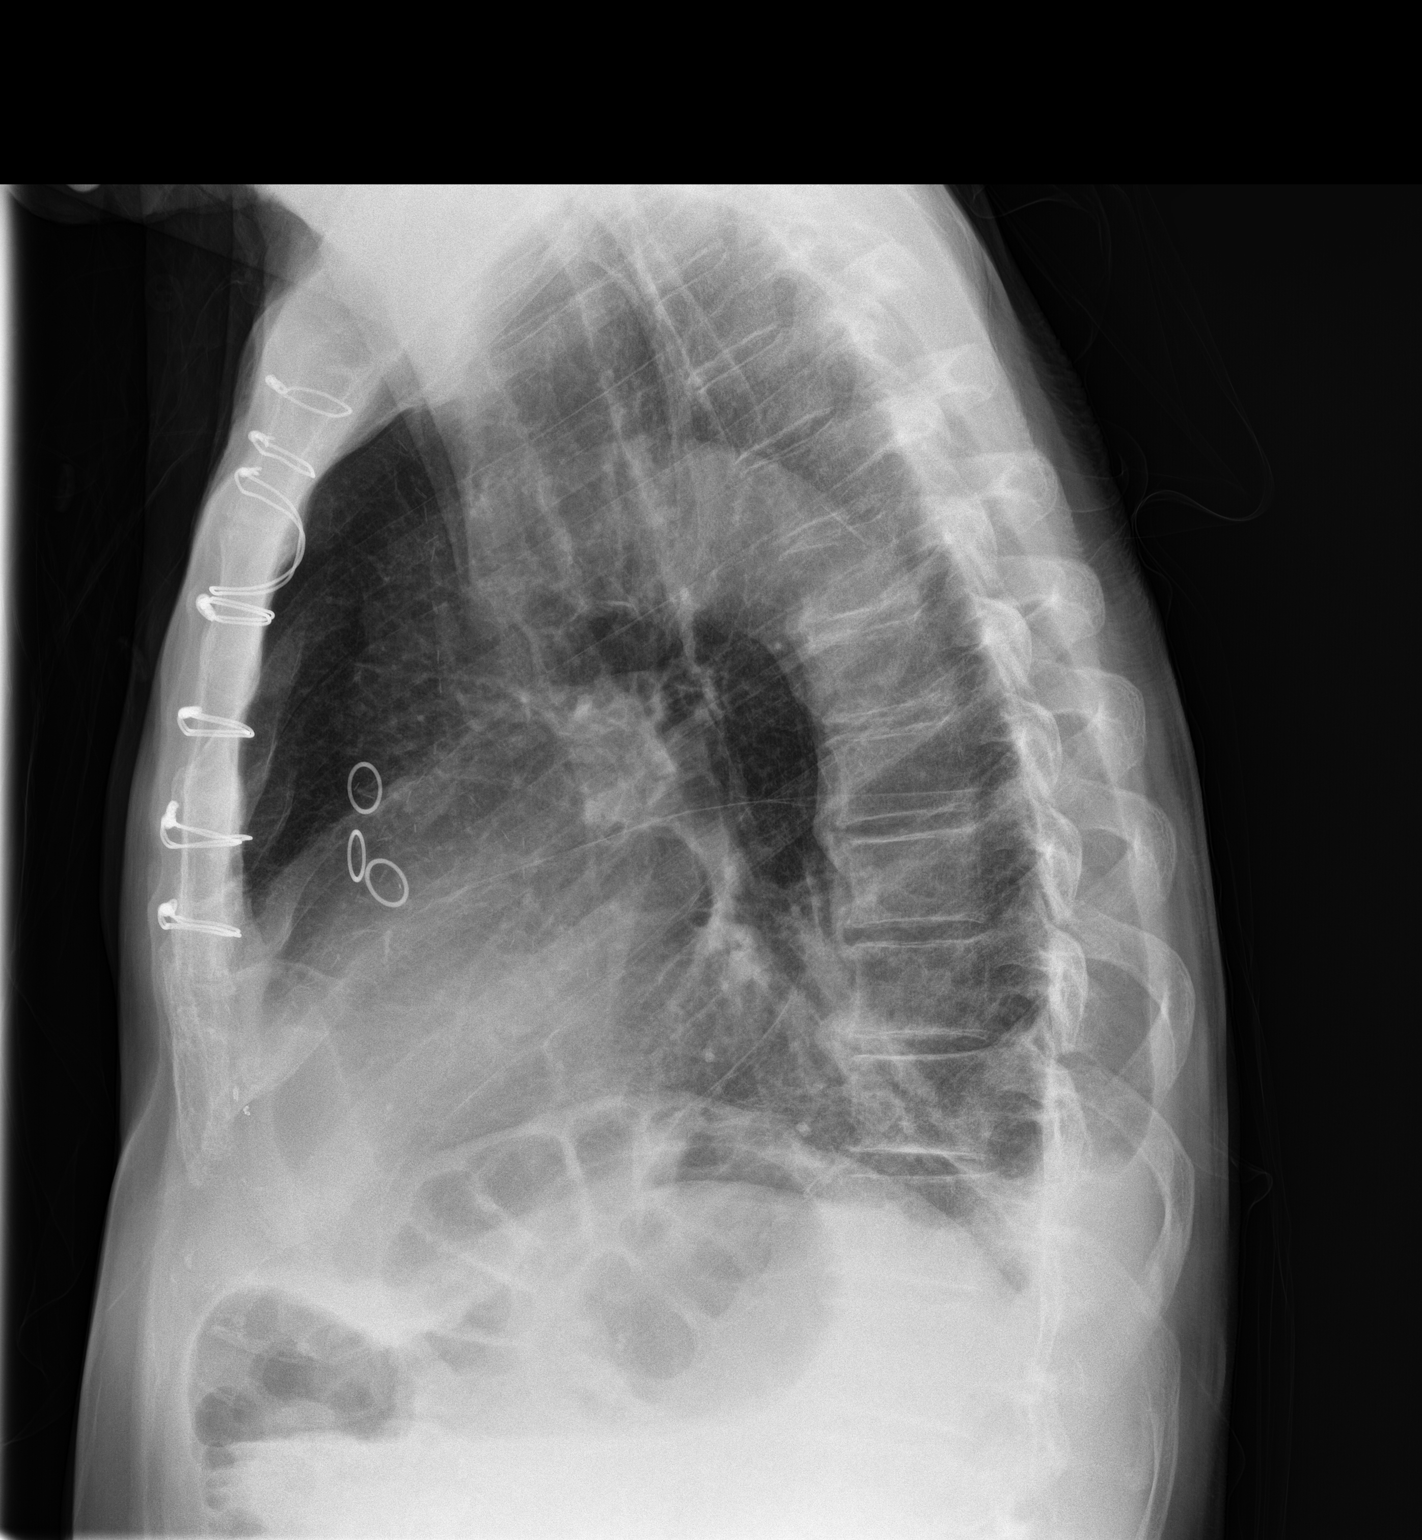

[2 of 2 positions shown; findings below may reference images not displayed]

FINDINGS: Prior coronary bypass changes noted. Stable mild cardiomegaly
without CHF or focal pneumonia. Hyperinflation evident with mild
perihilar and bibasilar scarring/atelectasis. No new superimposed
acute pneumonia, collapse or consolidation. No pneumothorax. Trace
pleural effusions bilaterally, slightly larger on the left. Trachea
midline. Atherosclerosis and tortuosity of the aorta. Degenerative
changes of the spine.
IMPRESSION: Hyperinflation compatible with background COPD/ emphysema

Cardiomegaly without CHF

Trace pleural effusions and basilar scarring/ atelectasis
